# Patient Record
Sex: Male | Born: 1950
Health system: Southern US, Community
[De-identification: ages and names within clinical notes are randomized; demographics above are authoritative.]

## PROBLEM LIST (undated history)

## (undated) DIAGNOSIS — I2510 Atherosclerotic heart disease of native coronary artery without angina pectoris: Secondary | ICD-10-CM

## (undated) DIAGNOSIS — M1990 Unspecified osteoarthritis, unspecified site: Secondary | ICD-10-CM

## (undated) DIAGNOSIS — N200 Calculus of kidney: Secondary | ICD-10-CM

## (undated) DIAGNOSIS — I10 Essential (primary) hypertension: Secondary | ICD-10-CM

## (undated) DIAGNOSIS — I509 Heart failure, unspecified: Secondary | ICD-10-CM

## (undated) DIAGNOSIS — E785 Hyperlipidemia, unspecified: Secondary | ICD-10-CM

## (undated) DIAGNOSIS — K219 Gastro-esophageal reflux disease without esophagitis: Secondary | ICD-10-CM

## (undated) DIAGNOSIS — R0902 Hypoxemia: Secondary | ICD-10-CM

## (undated) HISTORY — DX: Hypoxemia: R09.02

## (undated) HISTORY — DX: Gastro-esophageal reflux disease without esophagitis: K21.9

## (undated) HISTORY — DX: Hyperlipidemia, unspecified: E78.5

## (undated) HISTORY — DX: Atherosclerotic heart disease of native coronary artery without angina pectoris: I25.10

## (undated) HISTORY — DX: Heart failure, unspecified: I50.9

## (undated) HISTORY — DX: Unspecified osteoarthritis, unspecified site: M19.90

## (undated) HISTORY — DX: Essential (primary) hypertension: I10

## (undated) HISTORY — DX: Calculus of kidney: N20.0

---
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
## 2007-06-29 DIAGNOSIS — N200 Calculus of kidney: Secondary | ICD-10-CM

## 2007-06-29 HISTORY — DX: Calculus of kidney: N20.0

## 2009-06-04 ENCOUNTER — Ambulatory Visit: Payer: Self-pay | Admitting: Family Medicine

## 2009-06-04 DIAGNOSIS — E291 Testicular hypofunction: Secondary | ICD-10-CM | POA: Insufficient documentation

## 2009-06-04 DIAGNOSIS — K219 Gastro-esophageal reflux disease without esophagitis: Secondary | ICD-10-CM | POA: Insufficient documentation

## 2009-06-04 DIAGNOSIS — N529 Male erectile dysfunction, unspecified: Secondary | ICD-10-CM | POA: Insufficient documentation

## 2009-06-04 DIAGNOSIS — I10 Essential (primary) hypertension: Secondary | ICD-10-CM | POA: Insufficient documentation

## 2009-06-06 LAB — CONVERTED CEMR LAB
BUN: 21 mg/dL (ref 6–23)
CO2: 25 meq/L (ref 19–32)
Calcium: 9.5 mg/dL (ref 8.4–10.5)
Chloride: 103 meq/L (ref 96–112)
Cholesterol: 199 mg/dL (ref 0–200)
Creatinine, Ser: 1.1 mg/dL (ref 0.40–1.50)
HDL goal, serum: 40 mg/dL
HDL: 50 mg/dL (ref >39)
Testosterone Free: 56.8 pg/mL (ref 47.0–244.0)
Total CHOL/HDL Ratio: 4

## 2009-08-05 ENCOUNTER — Ambulatory Visit: Payer: Self-pay | Admitting: Family Medicine

## 2009-08-05 DIAGNOSIS — J029 Acute pharyngitis, unspecified: Secondary | ICD-10-CM | POA: Insufficient documentation

## 2009-08-05 LAB — CONVERTED CEMR LAB: Rapid Strep: NEGATIVE

## 2009-08-22 ENCOUNTER — Telehealth: Payer: Self-pay | Admitting: Family Medicine

## 2010-06-10 ENCOUNTER — Encounter: Payer: Self-pay | Admitting: Family Medicine

## 2010-06-10 ENCOUNTER — Ambulatory Visit: Payer: Self-pay | Admitting: Family Medicine

## 2010-06-11 LAB — CONVERTED CEMR LAB
ALT: 27 units/L (ref 0–53)
AST: 23 units/L (ref 0–37)
Albumin: 4.5 g/dL (ref 3.5–5.2)
Calcium: 9.4 mg/dL (ref 8.4–10.5)
Chloride: 105 meq/L (ref 96–112)
PSA: 0.5 ng/mL (ref <=4.00)
Potassium: 4.6 meq/L (ref 3.5–5.3)
Sodium: 140 meq/L (ref 135–145)
Total CHOL/HDL Ratio: 3.3

## 2010-07-28 NOTE — Letter (Signed)
Summary: Out of Work  [facility]  [address]   [place], [place] [address]   Phone: [phone]  Fax: [phone]    August 05, 2009   Employee:  Joshua Warner    To Whom It May Concern:   For Medical reasons, please excuse the above named employee from work for the following dates:  Start:   Feb 8th - 9th  End:   Feb 10th  If you need additional information, please feel free to contact our office.         Sincerely,    Seymour Bars DO

## 2010-07-28 NOTE — Progress Notes (Signed)
Summary: ST and cough  Phone Note Call from Patient   Summary of Call: Pt called stating he still has ST and cough. Pt requests meds to be called for him. Please advise.  Initial call taken by: Payton Spark CMA,  August 22, 2009 9:26 AM  Follow-up for Phone Call        OK, will go ahead and treat with a round of antibiotics.  Call if not improved afterwards. Follow-up by: Seymour Bars DO,  August 22, 2009 9:46 AM    New/Updated Medications: AMOXICILLIN 500 MG CAPS (AMOXICILLIN) 1 capsule by mouth three times a day x 10 days Prescriptions: AMOXICILLIN 500 MG CAPS (AMOXICILLIN) 1 capsule by mouth three times a day x 10 days  #30 x 0   Entered and Authorized by:   Seymour Bars DO   Signed by:   Seymour Bars DO on 08/22/2009   Method used:   Electronically to        [facility] [phone]* (retail)       [address] [place], [place]  [address]       Ph: [phone]       Fax: [phone]   RxID:   [id number]   Appended Document: ST and cough Pt aware

## 2010-07-28 NOTE — Assessment & Plan Note (Signed)
Summary: URI   Vital Signs:  Patient profile:   60 year old male Height:      70.5 inches Weight:      215 pounds BMI:     30.52 O2 Sat:      95 % on Room air Temp:     98.2 degrees F oral Pulse rate:   80 / minute BP sitting:   145 / 92  (left arm) Cuff size:   large  Vitals Entered By: Payton Spark CMA/april (August 05, 2009 1:12 PM)  O2 Flow:  Room air CC: c/o sore throat and cough x 3 days   Primary Care Provider:  Nani Gasser, MD  CC:  c/o sore throat and cough x 3 days.  History of Present Illness: 60 yo WM presents for 3 days of cough followed by sore throat and subjective fevers.  Has fatigue.  Had a flu shot this year.  He took Nyquil last night which helped.  No sinus pressure or HA.  His cough is dry.  He is sleeping well w/ Nyquil.  No GI upset.  No rhinorrhea.      Current Medications (verified): 1)  Losartan Potassium 50 Mg Tabs (Losartan Potassium) .... Take 1 Tablet By Mouth Once A Day 2)  Omeprazole 20 Mg Cpdr (Omeprazole) .... Take One Tablet By Mouth Once A Day 3)  Multivitamins  Tabs (Multiple Vitamin) .... Take One Tablet By Mouth Once A Day 4)  Glucosamine-Chondroitin  Caps (Glucosamine-Chondroit-Vit C-Mn) .... Take Noe Tablet By Mouth Once A Day 5)  Cialis 2.5 Mg Tabs (Tadalafil) .... Take 1 Tablet By Mouth Once A Day  Allergies (verified): 1)  ! Sulfa 2)  ! Celebrex 3)  ! * Bextra  Past History:  Past Medical History: Reviewed history from 06/04/2009 and no changes required. HTN since 2007 Shatzkis ring 03/2006 stretched - colonoscopy 03/2006.  Hx of kideny stones 2009  Past Surgical History: Reviewed history from 06/04/2009 and no changes required. Tonsillectomy 1957  Social History: Reviewed history from 06/04/2009 and no changes required. 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  Married to [place] 2 adult kids.  Never Smoked Alcohol use-yes Drug use-no Regular exercise-no 2-3 caffeinated drinks per  day.   Review of Systems      See HPI  Physical Exam  General:  alert, well-developed, well-nourished, and well-hydrated.   Head:  normocephalic and atraumatic.  sinuses NTTP Eyes:  conjunctiva clear Nose:  no nasal discharge.   Mouth:  good dentition and pharynx pink and moist.  o/p injected Neck:  no masses.   Lungs:  Normal respiratory effort, chest expands symmetrically. Lungs are clear to auscultation, no crackles or wheezes. dry cough. Heart:  Normal rate and regular rhythm. S1 and S2 normal without gallop, murmur, click, rub or other extra sounds. Skin:  color normal.   Cervical Nodes:  shotty anterior cervical chain LA   Impression & Recommendations:  Problem # 1:  ACUTE PHARYNGITIS (ICD-462)  Rapid strep: Neg. Treat for viral URI/ pharyngitis with supportive care. Avoid decongestants with HTN. Call if not improving by Monday.  Orders: Rapid Strep ([id number])  Complete Medication List: 1)  Losartan Potassium 50 Mg Tabs (Losartan potassium) .... Take 1 tablet by mouth once a day 2)  Omeprazole 20 Mg Cpdr (Omeprazole) .... Take one tablet by mouth once a day 3)  Multivitamins Tabs (Multiple vitamin) .... Take one tablet by mouth once a day 4)  Glucosamine-chondroitin  Caps (Glucosamine-chondroit-vit c-mn) .... Take noe tablet by mouth once a day 5)  Cialis 2.5 Mg Tabs (Tadalafil) .... Take 1 tablet by mouth once a day  Patient Instructions: 1)  Rapid Strep : NEGATIVE. 2)  Treat viral sore throat with Advil as needed. 3)  Use Chlorasceptic spray, drink plenty of clear fluids and rest. 4)  Call if not improving by Monday.  Laboratory Results    Other Tests  Rapid Strep: negative

## 2010-07-30 NOTE — Assessment & Plan Note (Signed)
Summary: Fasting CPE   Vital Signs:  Patient profile:   60 year old male Height:      70.5 inches Weight:      205 pounds Pulse rate:   67 / minute BP sitting:   127 / 87  (right arm) Cuff size:   large  Vitals Entered By: Avon Gully CMA, Duncan Dull) (June 10, 2010 8:34 AM) CC: CPE   Primary Care Dyland Panuco:  Nani Gasser, MD  CC:  CPE.  History of Present Illness: has lost 10 lbs.  Just got new glasses a few weeks ago.  No other specific complaints. not taking the cialis as was too expensive.   Current Medications (verified): 1)  Losartan Potassium 50 Mg Tabs (Losartan Potassium) .... Take 1 Tablet By Mouth Once A Day 2)  Omeprazole 20 Mg Cpdr (Omeprazole) .... Take One Tablet By Mouth Once A Day 3)  Multivitamins  Tabs (Multiple Vitamin) .... Take One Tablet By Mouth Once A Day 4)  Glucosamine-Chondroitin  Caps (Glucosamine-Chondroit-Vit C-Mn) .... Take Noe Tablet By Mouth Once A Day 5)  Cialis 2.5 Mg Tabs (Tadalafil) .... Take 1 Tablet By Mouth Once A Day 6)  Amoxicillin 500 Mg Caps (Amoxicillin) .Marland Kitchen.. 1 Capsule By Mouth Three Times A Day X 10 Days  Allergies (verified): 1)  ! Sulfa 2)  ! Celebrex 3)  ! * Bextra  Comments:  Nurse/Medical Assistant: The patient's medications and allergies were reviewed with the patient and were updated in the Medication and Allergy Lists. Avon Gully CMA, Duncan Dull) (June 10, 2010 8:35 AM)  Past History:  Past Medical History: Last updated: 06/04/2009 HTN since 2007 Shatzkis ring 03/2006 stretched - colonoscopy 03/2006.  Hx of kideny stones 2009  Past Surgical History: Last updated: 06/04/2009 Tonsillectomy 1957  Family History: Last updated: 06/04/2009 Uncle with prostate Ca GF with MI Uncle with depression Mother with HTN father died in his 24s but not clear on cause.    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  Married to [place] 2 adult kids.    Never Smoked Alcohol use-yes Drug use-no Regular exercise-no 2-3 caffeinated drinks per day.   Review of Systems  The patient denies anorexia, fever, weight loss, weight gain, vision loss, decreased hearing, hoarseness, chest pain, syncope, dyspnea on exertion, peripheral edema, prolonged cough, headaches, hemoptysis, abdominal pain, melena, hematochezia, severe indigestion/heartburn, hematuria, incontinence, genital sores, muscle weakness, suspicious skin lesions, transient blindness, difficulty walking, depression, unusual weight change, abnormal bleeding, enlarged lymph nodes, angioedema, and testicular masses.    Physical Exam  General:  Well-developed,well-nourished,in no acute distress; alert,appropriate and cooperative throughout examination Head:  Normocephalic and atraumatic without obvious abnormalities. No apparent alopecia or balding. Eyes:  No corneal or conjunctival inflammation noted. EOMI. Perrla.  Ears:  External ear exam shows no significant lesions or deformities.  Otoscopic examination reveals clear canals, tympanic membranes are intact bilaterally without bulging, retraction, inflammation or discharge. Hearing is grossly normal bilaterally. Nose:  External nasal examination shows no deformity or inflammation. Nasal mucosa are pink and moist without lesions or exudates. Mouth:  Oral mucosa and oropharynx without lesions or exudates.  Teeth in good repair. Neck:  No deformities, masses, or tenderness noted. Chest Wall:  No deformities, masses, tenderness or gynecomastia noted. Lungs:  Normal respiratory effort, chest expands symmetrically. Lungs are clear to auscultation, no crackles or wheezes. Heart:  Normal rate and regular rhythm. S1 and S2 normal without gallop, murmur, click, rub  or other extra sounds. Abdomen:  Bowel sounds positive,abdomen soft and non-tender without masses, organomegaly or hernias noted. Rectal:  no external abnormalities, normal sphincter tone,  and no masses.   Prostate:  no nodules, no asymmetry, and 1+ enlarged.   Msk:  No deformity or scoliosis noted of thoracic or lumbar spine.   Pulses:  R and L carotid,radial,dorsalis pedis and posterior tibial pulses are full and equal bilaterally Extremities:  No clubbing, cyanosis, edema, or deformity noted with normal full range of motion of all joints.   Neurologic:  No cranial nerve deficits noted. Station and gait are normal. DTRs are symmetrical throughout. Sensory, motor and coordinative functions appear intact. Skin:  no rashes.  no rashes.   Cervical Nodes:  No lymphadenopathy noted Psych:  Cognition and judgment appear intact. Alert and cooperative with normal attention span and concentration. No apparent delusions, illusions, hallucinations   Impression & Recommendations:  Problem # 1:  HEALTH MAINTENANCE EXAM (ICD-V70.0) Exam is normal.  Due for screening labs Immunizations are uptodate.   Orders: T-Comprehensive Metabolic Panel [id number]) T-Lipid Profile [phone]) T-PSA ([id number])  Problem # 2:  HYPERTENSION, BENIGN (ICD-401.1) Looks great tody. Refill sent.  His updated medication list for this problem includes:    Losartan Potassium 50 Mg Tabs (Losartan potassium) .Marland Kitchen... Take 1 tablet by mouth once a day  Complete Medication List: 1)  Losartan Potassium 50 Mg Tabs (Losartan potassium) .... Take 1 tablet by mouth once a day 2)  Omeprazole 20 Mg Cpdr (Omeprazole) .... Take one tablet by mouth once a day 3)  Multivitamins Tabs (Multiple vitamin) .... Take one tablet by mouth once a day 4)  Glucosamine-chondroitin Caps (Glucosamine-chondroit-vit c-mn) .... Take noe tablet by mouth once a day 5)  Cialis 2.5 Mg Tabs (Tadalafil) .... Take 1 tablet by mouth once a day  Patient Instructions: 1)  Please schedule a follow-up appointment in 6 monthsfor blood pressure .  2)  We will call you with your lab results.  Prescriptions: OMEPRAZOLE 20 MG CPDR (OMEPRAZOLE)  Take one tablet by mouth once a day  #90 x 3   Entered and Authorized by:   Nani Gasser MD   Signed by:   Nani Gasser MD on 06/10/2010   Method used:   Electronically to        [facility]* (retail)             ,          Ph: [phone]       Fax: [phone]   RxID:   [id number] LOSARTAN POTASSIUM 50 MG TABS (LOSARTAN POTASSIUM) Take 1 tablet by mouth once a day  #90 x 3   Entered and Authorized by:   Nani Gasser MD   Signed by:   Nani Gasser MD on 06/10/2010   Method used:   Electronically to        [facility]* (retail)             ,          Ph: [phone]       Fax: [phone]   RxID:   [id number] GURKYHCW POTASSIUM 50 MG TABS (LOSARTAN POTASSIUM) Take 1 tablet by mouth once a day  #90 x 3   Entered and Authorized by:   Nani Gasser MD   Signed by:   Nani Gasser MD on 06/10/2010   Method used:   Electronically to        [facility] [phone]* (retail)  [address] [place], [place]  [address]       Ph: [phone]       Fax: [phone]   RxID:   [id number]    Orders Added: 1)  Est. Patient age 84-64 [22] 2)  T-Comprehensive Metabolic Panel [[id number]] 3)  T-Lipid Profile [id number] 4)  T-PSA [[id number]]   Immunization History:  Influenza Immunization History:    Influenza:  historical (03/28/2010)   Immunization History:  Influenza Immunization History:    Influenza:  Historical (03/28/2010)   Immunization History:  Influenza Immunization History:    Influenza:  historical (03/28/2010)

## 2010-11-10 ENCOUNTER — Encounter: Payer: Self-pay | Admitting: Family Medicine

## 2010-11-16 ENCOUNTER — Ambulatory Visit (INDEPENDENT_AMBULATORY_CARE_PROVIDER_SITE_OTHER): Payer: BC Managed Care – PPO | Admitting: Family Medicine

## 2010-11-16 ENCOUNTER — Ambulatory Visit
Admission: RE | Admit: 2010-11-16 | Discharge: 2010-11-16 | Disposition: A | Discharge status: Home / Self Care | Payer: BC Managed Care – PPO | Source: Ambulatory Visit | Attending: Family Medicine | Admitting: Family Medicine

## 2010-11-16 ENCOUNTER — Encounter: Payer: Self-pay | Admitting: Family Medicine

## 2010-11-16 ENCOUNTER — Telehealth: Payer: Self-pay | Admitting: Family Medicine

## 2010-11-16 VITALS — BP 132/93 | HR 89 | Ht 70.5 in | Wt 207.0 lb

## 2010-11-16 DIAGNOSIS — M25569 Pain in unspecified knee: Secondary | ICD-10-CM

## 2010-11-16 DIAGNOSIS — M25561 Pain in right knee: Secondary | ICD-10-CM

## 2010-11-16 DIAGNOSIS — I10 Essential (primary) hypertension: Secondary | ICD-10-CM

## 2010-11-16 MED ORDER — LOSARTAN POTASSIUM 50 MG PO TABS: 100.0000 mg | ORAL_TABLET | Freq: Every day | ORAL | Status: DC

## 2010-11-16 MED ORDER — LOSARTAN POTASSIUM 100 MG PO TABS: 100.0000 mg | ORAL_TABLET | Freq: Every day | ORAL | Status: DC

## 2010-11-16 NOTE — Assessment & Plan Note (Signed)
Not at goal. Inc losatartan to 100mg  and recheck in 1 month.

## 2010-11-16 NOTE — Progress Notes (Signed)
  Subjective:    Patient ID: Joshua Warner, male    DOB: 1950/12/07, 60 y.o.   MRN: [id number]  HPI Right knee pain for the last 3 months.  Injured his knee about 5-6 years ago. Was crouching down and reached for something and then felt something pop in his knee and it was very painful.  It happened again a few more times. Then last fall the right knee would get stiff eiding in the car for long periods.  Started again in March after sitting in care for long ride.  No swelling.  Has been popping a little.  No locking.  Painful in the back of the knee.  It will get better with rest for a couple of days. Taking aleve, Advil, some naproxen.   Review of Systems     Objective:   Physical Exam  Constitutional: He appears well-developed and well-nourished.  Musculoskeletal:       Right knee with NROM.  No laxity of the joint compared to the left. No swelling. Nontender along the joint lines or posterially. Neg McMurrays. Some slight discomfort with full fletion.  Hip, knee and ankle strength 5/5.           Assessment & Plan:  Right knee Pain - Xray since pain on and off for several years. Thought will likely be normal . Refer to Ortho.  Continue Alve bid prn pain.  I am most suspiciious of a cartilage tear.

## 2010-11-16 NOTE — Telephone Encounter (Signed)
Call pt: Xray is nl. We can try to have you set up with ortho or we can try to order an MRI.

## 2010-11-16 NOTE — Telephone Encounter (Signed)
Pt notified and pt would like to do the MRI first

## 2010-12-02 ENCOUNTER — Other Ambulatory Visit: Payer: Self-pay | Admitting: Family Medicine

## 2010-12-02 ENCOUNTER — Ambulatory Visit (INDEPENDENT_AMBULATORY_CARE_PROVIDER_SITE_OTHER): Payer: BC Managed Care – PPO | Admitting: Family Medicine

## 2010-12-02 VITALS — BP 115/83 | HR 68

## 2010-12-02 DIAGNOSIS — I10 Essential (primary) hypertension: Secondary | ICD-10-CM

## 2010-12-02 MED ORDER — LOSARTAN POTASSIUM 100 MG PO TABS: 100.0000 mg | ORAL_TABLET | Freq: Every day | ORAL | Status: DC

## 2010-12-02 NOTE — Progress Notes (Signed)
  Subjective:    Patient ID: Joshua Warner, male    DOB: Aug 21, 1950, 60 y.o.   MRN: [id number]  HPI  Pt denies chest pain, SOB, dizziness, or heart palpitations.  Taking meds as directed w/o problems.  Denies medication side effects.  5 min spent with pt.   Review of Systems     Objective:   Physical Exam        Assessment & Plan:  BP looks perfect on Cozaar 100 mg/ day. Continue RX sent to [facility].

## 2010-12-02 NOTE — Telephone Encounter (Signed)
Patient walk-in office today request to know if he can have a pill to calm his nerves for MRI he is having done on Saturday just to help him stay comfortable during 45 min MRI. Patient states he gets anxious and very uncomfortable during the MRI. Patient said he can come pick it up or if you can send it to [facility] pharmacy in [place]

## 2010-12-02 NOTE — Progress Notes (Signed)
  Subjective:    Patient ID: Joshua Warner, male    DOB: Nov 26, 1950, 60 y.o.   MRN: [id number]  HPI  Pt denies chest pain, SOB, dizziness, or heart palpitations.  Taking meds as directed w/o problems.  Denies medication side effects.  5 min spent with pt.     Review of Systems     Objective:   Physical Exam        Assessment & Plan:

## 2010-12-02 NOTE — Telephone Encounter (Signed)
Will fax over rx today.

## 2010-12-03 ENCOUNTER — Other Ambulatory Visit: Payer: Self-pay | Admitting: Family Medicine

## 2010-12-03 MED ORDER — ALPRAZOLAM 1 MG PO TABS: ORAL_TABLET | ORAL | Status: DC

## 2010-12-03 NOTE — Telephone Encounter (Signed)
Pt called and is requesting losartan 100mg . Plan  This med was sent to [facility] on 12-02-10.  Pt informed. Jarvis Newcomer, LPN Domingo Dimes

## 2010-12-03 NOTE — Telephone Encounter (Signed)
Pt informed that Dr. Linford Arnold sent a prescription for anxeity to help him get through the procedure on Saturday.  Told pt to pup and take 30 mins prior to MRI on Saturday.  Pt voiced understanding. Jarvis Newcomer, LPN [facility] Dimes

## 2010-12-05 ENCOUNTER — Ambulatory Visit
Admission: RE | Admit: 2010-12-05 | Discharge: 2010-12-05 | Disposition: A | Discharge status: Home / Self Care | Payer: BC Managed Care – PPO | Source: Ambulatory Visit | Attending: Family Medicine | Admitting: Family Medicine

## 2010-12-05 DIAGNOSIS — M25569 Pain in unspecified knee: Secondary | ICD-10-CM

## 2010-12-06 ENCOUNTER — Telehealth: Payer: Self-pay | Admitting: Family Medicine

## 2010-12-06 DIAGNOSIS — S83289A Other tear of lateral meniscus, current injury, unspecified knee, initial encounter: Secondary | ICD-10-CM

## 2010-12-06 NOTE — Telephone Encounter (Signed)
Call p: MRI shows tear of the lateral miniscus.  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  Also medial plica syndrome.  This means he really needs to see ortho for further evaluation.

## 2010-12-07 NOTE — Telephone Encounter (Signed)
Advised pt  Of result. Pt states he has appt w/ortho on Wed.

## 2010-12-31 HISTORY — PX: KNEE ARTHROSCOPY: SUR90

## 2011-01-15 ENCOUNTER — Ambulatory Visit: Payer: BC Managed Care – PPO | Attending: Orthopedic Surgery | Admitting: Physical Therapy

## 2011-01-15 DIAGNOSIS — M25669 Stiffness of unspecified knee, not elsewhere classified: Secondary | ICD-10-CM | POA: Insufficient documentation

## 2011-01-15 DIAGNOSIS — M6281 Muscle weakness (generalized): Secondary | ICD-10-CM | POA: Insufficient documentation

## 2011-01-15 DIAGNOSIS — R269 Unspecified abnormalities of gait and mobility: Secondary | ICD-10-CM | POA: Insufficient documentation

## 2011-01-15 DIAGNOSIS — IMO0001 Reserved for inherently not codable concepts without codable children: Secondary | ICD-10-CM | POA: Insufficient documentation

## 2011-01-15 DIAGNOSIS — M25569 Pain in unspecified knee: Secondary | ICD-10-CM | POA: Insufficient documentation

## 2011-01-18 ENCOUNTER — Other Ambulatory Visit: Payer: Self-pay | Admitting: Family Medicine

## 2011-01-18 ENCOUNTER — Telehealth: Payer: Self-pay | Admitting: Family Medicine

## 2011-01-18 ENCOUNTER — Ambulatory Visit: Payer: BC Managed Care – PPO | Admitting: Physical Therapy

## 2011-01-18 NOTE — Telephone Encounter (Signed)
Pt called and said [facility] has not sent his BP med to him and he had called in days ago to request. Plan:  Reviewed the pt chart and the RX was sent on 12-02-10.  Told the pt to call [facility] to see if they have it and if not to have them call and speak directly with the triage nurse, and I will give a verbal order. Jarvis Newcomer, LPN [facility] Dimes

## 2011-01-18 NOTE — Telephone Encounter (Signed)
Pt called and stated that [facility] did not have his refill for losartan from 12-02-10.  [facility] and gave a verbal order over the phone. Jarvis Newcomer, LPN [facility] Dimes

## 2011-01-22 ENCOUNTER — Ambulatory Visit: Payer: BC Managed Care – PPO | Admitting: Physical Therapy

## 2011-01-25 ENCOUNTER — Ambulatory Visit: Payer: BC Managed Care – PPO | Admitting: Physical Therapy

## 2011-01-27 ENCOUNTER — Ambulatory Visit: Payer: BC Managed Care – PPO | Attending: Orthopedic Surgery | Admitting: Physical Therapy

## 2011-01-27 DIAGNOSIS — M25669 Stiffness of unspecified knee, not elsewhere classified: Secondary | ICD-10-CM | POA: Insufficient documentation

## 2011-01-27 DIAGNOSIS — IMO0001 Reserved for inherently not codable concepts without codable children: Secondary | ICD-10-CM | POA: Insufficient documentation

## 2011-01-27 DIAGNOSIS — M6281 Muscle weakness (generalized): Secondary | ICD-10-CM | POA: Insufficient documentation

## 2011-01-27 DIAGNOSIS — R269 Unspecified abnormalities of gait and mobility: Secondary | ICD-10-CM | POA: Insufficient documentation

## 2011-01-27 DIAGNOSIS — M25569 Pain in unspecified knee: Secondary | ICD-10-CM | POA: Insufficient documentation

## 2011-02-01 ENCOUNTER — Ambulatory Visit: Payer: BC Managed Care – PPO | Admitting: Physical Therapy

## 2011-02-04 ENCOUNTER — Ambulatory Visit: Payer: BC Managed Care – PPO | Admitting: Physical Therapy

## 2011-02-08 ENCOUNTER — Ambulatory Visit: Payer: BC Managed Care – PPO | Admitting: Physical Therapy

## 2011-05-28 ENCOUNTER — Encounter: Payer: Self-pay | Admitting: Family Medicine

## 2011-06-03 ENCOUNTER — Ambulatory Visit (INDEPENDENT_AMBULATORY_CARE_PROVIDER_SITE_OTHER): Payer: BC Managed Care – PPO | Admitting: Family Medicine

## 2011-06-03 ENCOUNTER — Encounter: Payer: Self-pay | Admitting: Family Medicine

## 2011-06-03 VITALS — BP 132/77 | HR 74 | Ht 69.0 in | Wt 204.0 lb

## 2011-06-03 DIAGNOSIS — Z0000 Encounter for general adult medical examination without abnormal findings: Secondary | ICD-10-CM

## 2011-06-03 MED ORDER — LOSARTAN POTASSIUM 100 MG PO TABS: 100.0000 mg | ORAL_TABLET | Freq: Every day | ORAL | Status: DC

## 2011-06-03 MED ORDER — OMEPRAZOLE 20 MG PO CPDR: 20.0000 mg | DELAYED_RELEASE_CAPSULE | Freq: Every day | ORAL | Status: DC

## 2011-06-03 NOTE — Progress Notes (Signed)
  Subjective:    Patient ID: Joshua Warner, male    DOB: [birth year], 60 y.o.   MRN: [id number]  HPI  Here for CPE. No complaints.   Review of Systems Comprehensive ROS is neg  BP 132/77  Pulse 74  Ht 5\' 9"  (1.753 m)  Wt 204 lb (92.534 kg)  BMI 30.13 kg/m2    Allergies  Allergen Reactions  . Celecoxib     REACTION: rash  . Sulfonamide Derivatives     REACTION: hives    Past Medical History  Diagnosis Date  . Hypertension since 2007  . Kidney stones 2009    Past Surgical History  Procedure Date  . Tonsillectomy 1957  . Knee arthroscopy 12/31/2010    Right knee     History   Social History  . Marital Status: Married    Spouse Name: N/A    Number of Children: 2  . Years of Education: N/A   Occupational History  .  [employer]   Social History Main Topics  . Smoking status: Never Smoker   . Smokeless tobacco: Not on file  . Alcohol Use: 2.0 oz/week    4 drink(s) per week  . Drug Use: No  . 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   Other Topics Concern  . Not on file   Social History Narrative   No regular exercise. Has more physical job.     Family History  Problem Relation Age of Onset  . Hypertension Mother   . Prostate cancer Maternal Uncle     Deceased  . Depression Maternal Uncle   . Cancer Paternal Grandfather   . Cancer Paternal Grandmother   . Heart disease Father        Objective:   Physical Exam  Constitutional: He is oriented to person, place, and time. He appears well-developed and well-nourished.  HENT:  Head: Normocephalic and atraumatic.  Right Ear: External ear normal.  Left Ear: External ear normal.  Nose: Nose normal.  Mouth/Throat: Oropharynx is clear and moist.  Eyes: Conjunctivae and EOM are normal. Pupils are equal, round, and reactive to light.  Neck: Normal range of motion. Neck supple.  No thyromegaly present.  Cardiovascular: Normal rate, regular rhythm, normal heart sounds and intact distal pulses.        No carotid or abdominal bruits.   Pulmonary/Chest: Effort normal and breath sounds normal.  Abdominal: Soft. Bowel sounds are normal. He exhibits no distension and no mass. There is no tenderness. There is no rebound and no guarding.  Genitourinary: Prostate normal. Guaiac negative stool.  Musculoskeletal: Normal range of motion.  Lymphadenopathy:    He has no cervical adenopathy.  Neurological: He is alert and oriented to person, place, and time. He has normal reflexes.  Skin: Skin is warm and dry.  Psychiatric: He has a normal mood and affect. His behavior is normal. Judgment and thought content normal.          Assessment & Plan:  CPE - doing well.  Given lab slip for labs for BP.  Start a regular exercise program and make sure you are eating a healthy diet Try to eat 4 servings of dairy a day or take a calcium supplement (500mg  twice a day). Your vaccines are up to date.

## 2011-06-03 NOTE — Patient Instructions (Signed)
Start a regular exercise program and make sure you are eating a healthy diet Try to eat 4 servings of dairy a day or take a calcium supplement (500mg twice a day). Your vaccines are up to date.   

## 2011-06-04 LAB — LIPID PANEL
Cholesterol: 173 mg/dL (ref 0–200)
HDL: 44 mg/dL (ref >39)
LDL Cholesterol: 103 mg/dL — ABNORMAL HIGH (ref 0–99)
Triglycerides: 128 mg/dL (ref <150)
VLDL: 26 mg/dL (ref 0–40)

## 2011-06-04 LAB — COMPLETE METABOLIC PANEL WITH GFR
ALT: 22 U/L (ref 0–53)
AST: 17 U/L (ref 0–37)
BUN: 19 mg/dL (ref 6–23)
Calcium: 9.6 mg/dL (ref 8.4–10.5)
Creat: 1.07 mg/dL (ref 0.50–1.35)
Total Bilirubin: 0.6 mg/dL (ref 0.3–1.2)

## 2011-07-20 ENCOUNTER — Other Ambulatory Visit: Payer: Self-pay | Admitting: *Deleted

## 2011-07-20 MED ORDER — OMEPRAZOLE 20 MG PO CPDR: 20.0000 mg | DELAYED_RELEASE_CAPSULE | Freq: Every day | ORAL | Status: DC

## 2011-07-20 MED ORDER — LOSARTAN POTASSIUM 100 MG PO TABS: 100.0000 mg | ORAL_TABLET | Freq: Every day | ORAL | Status: DC

## 2011-07-21 ENCOUNTER — Other Ambulatory Visit: Payer: Self-pay | Admitting: Family Medicine

## 2011-07-22 ENCOUNTER — Other Ambulatory Visit: Payer: Self-pay | Admitting: Family Medicine

## 2011-10-13 ENCOUNTER — Ambulatory Visit (INDEPENDENT_AMBULATORY_CARE_PROVIDER_SITE_OTHER): Payer: BC Managed Care – PPO | Admitting: Family Medicine

## 2011-10-13 ENCOUNTER — Encounter: Payer: Self-pay | Admitting: Family Medicine

## 2011-10-13 VITALS — BP 127/88 | HR 80 | Ht 70.5 in | Wt 203.0 lb

## 2011-10-13 DIAGNOSIS — I10 Essential (primary) hypertension: Secondary | ICD-10-CM

## 2011-10-13 MED ORDER — LOSARTAN POTASSIUM 100 MG PO TABS: 100.0000 mg | ORAL_TABLET | Freq: Every day | ORAL | Status: DC

## 2011-10-13 NOTE — Progress Notes (Signed)
  Subjective:    Patient ID: Joshua Warner, male    DOB: 18-Dec-1950, 61 y.o.   MRN: [id number]  HPI HTN- Taking his meds regularly.  No CP.  Occ SOB with activity but says he is deconditioned. He walks a lot at work. Working on low salt diet.  He admits he out of shape.  Never a smoker.  No orthopnea.  No palps.  Started coughing last night.  Has had bronchitis. IBetter than he was. Still some mild congestion.  No fever.    Review of Systems     Objective:   Physical Exam  Constitutional: He is oriented to person, place, and time. He appears well-developed and well-nourished.  HENT:  Head: Normocephalic and atraumatic.  Right Ear: External ear normal.  Left Ear: External ear normal.  Nose: Nose normal.  Mouth/Throat: Oropharynx is clear and moist.       TMs and canals are clear.   Eyes: Conjunctivae and EOM are normal. Pupils are equal, round, and reactive to light.  Neck: Neck supple. No thyromegaly present.  Cardiovascular: Normal rate and normal heart sounds.   Pulmonary/Chest: Effort normal and breath sounds normal.  Lymphadenopathy:    He has no cervical adenopathy.  Neurological: He is alert and oriented to person, place, and time.  Skin: Skin is warm and dry.  Psychiatric: He has a normal mood and affect.          Assessment & Plan:  HTN - WEll controlled. AT goal. Reminded him ot continue to work on diet an dexercise. F/U in 6 month. RF send to [facility].

## 2012-06-12 ENCOUNTER — Encounter: Payer: BC Managed Care – PPO | Admitting: Family Medicine

## 2012-07-10 ENCOUNTER — Ambulatory Visit (INDEPENDENT_AMBULATORY_CARE_PROVIDER_SITE_OTHER): Payer: BC Managed Care – PPO | Admitting: Family Medicine

## 2012-07-10 ENCOUNTER — Encounter: Payer: Self-pay | Admitting: Family Medicine

## 2012-07-10 VITALS — BP 119/81 | HR 76 | Resp 18 | Wt 207.0 lb

## 2012-07-10 DIAGNOSIS — L820 Inflamed seborrheic keratosis: Secondary | ICD-10-CM

## 2012-07-10 DIAGNOSIS — R5383 Other fatigue: Secondary | ICD-10-CM

## 2012-07-10 DIAGNOSIS — Z0000 Encounter for general adult medical examination without abnormal findings: Secondary | ICD-10-CM

## 2012-07-10 DIAGNOSIS — E291 Testicular hypofunction: Secondary | ICD-10-CM

## 2012-07-10 DIAGNOSIS — H16139 Photokeratitis, unspecified eye: Secondary | ICD-10-CM

## 2012-07-10 LAB — CBC
HCT: 50.1 % (ref 39.0–52.0)
Hemoglobin: 17.8 g/dL — ABNORMAL HIGH (ref 13.0–17.0)
MCV: 88 fL (ref 78.0–100.0)
RBC: 5.69 MIL/uL (ref 4.22–5.81)
WBC: 8.1 10*3/uL (ref 4.0–10.5)

## 2012-07-10 LAB — PSA, TOTAL AND FREE
PSA, Free Pct: 45 % (ref >25)
PSA, Free: 0.29 ng/mL
PSA: 0.65 ng/mL (ref <=4.00)

## 2012-07-10 LAB — LIPID PANEL: LDL Cholesterol: 109 mg/dL — ABNORMAL HIGH (ref 0–99)

## 2012-07-10 MED ORDER — OMEPRAZOLE 20 MG PO CPDR: 20.0000 mg | DELAYED_RELEASE_CAPSULE | Freq: Every day | ORAL | Status: DC

## 2012-07-10 MED ORDER — LOSARTAN POTASSIUM 100 MG PO TABS: 100.0000 mg | ORAL_TABLET | Freq: Every day | ORAL | Status: DC

## 2012-07-10 NOTE — Progress Notes (Signed)
Subjective:    Patient ID: Joshua Warner, male    DOB: September 10, 1950, 62 y.o.   MRN: [id number]  HPI Here for CPE. No complaints. Vaccines are up to date. Colonoscopy and tetanus shot are up-to-date. He did have a flu shot at work this year. He is interested in additional vaccine but wants to check with his insurance first. Joshua Warner give him a handout. He would also like to have his testosterone recheck. He was on AndroGel for 5 years ago and is interested in having his levels rechecked. He's been feeling more fatigued lately at work and still has about 5 more years ago before he retires. He also has 2 lesions on his left forehead that he would like me to look at today. They've been there for quite some time and feels like one of them has been getting a little bit larger. He has had a history of significant sun exposure. He has not tried any treatments on them.    Review of Systems Comprehensive ROS is neg.    BP 119/81  Pulse 76  Resp 18  Wt 207 lb (93.895 kg)  SpO2 97%    Allergies  Allergen Reactions  . Celecoxib     REACTION: rash  . Sulfonamide Derivatives     REACTION: hives    Past Medical History  Diagnosis Date  . Hypertension since 2007  . Kidney stones 2009    Past Surgical History  Procedure Date  . Tonsillectomy 1957  . Knee arthroscopy 12/31/2010    Right knee     History   Social History  . Marital Status: Married    Spouse Name: N/A    Number of Children: 2  . Years of Education: N/A   Occupational History  .  Joshua Warner [employer]   Social History Main Topics  . Smoking status: Never Smoker   . Smokeless tobacco: Never Used  . Alcohol Use: 2.0 oz/week    4 drink(s) per week  . Drug Use: No  . Sexually Active: Not on file     Comment: 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   Other Topics Concern  . Not on file   Social History Narrative   No regular exercise.  Has more physical job.     Family History  Problem Relation Age of Onset  . Hypertension Mother   . Prostate cancer Maternal Uncle     Deceased  . Depression Maternal Uncle   . Cancer Paternal Grandfather   . Cancer Paternal Grandmother   . Heart disease Father     Outpatient Encounter Prescriptions as of 07/10/2012  Medication Sig Dispense Refill  . Glucosamine-Chondroit-Vit C-Mn (GLUCOSAMINE CHONDR 1500 COMPLX) CAPS Take by mouth daily.        Marland Kitchen losartan (COZAAR) 100 MG tablet Take 1 tablet (100 mg total) by mouth daily.  90 tablet  2  . Multiple Vitamin (MULTIVITAMIN) tablet Take 1 tablet by mouth daily.        Marland Kitchen omeprazole (PRILOSEC) 20 MG capsule TAKE 1 CAPSULE ONCE DAILY  90 capsule  2           Objective:   Physical Exam  Constitutional: He is oriented to person, place, and time. He appears well-developed and well-nourished.  HENT:  Head: Normocephalic and atraumatic.  Right Ear: External ear normal.  Left Ear: External ear normal.  Nose: Nose normal.  Mouth/Throat: Oropharynx is  clear and moist.  Eyes: Conjunctivae normal and EOM are normal. Pupils are equal, round, and reactive to light.  Neck: Normal range of motion. Neck supple. No thyromegaly present.  Cardiovascular: Normal rate, regular rhythm, normal heart sounds and intact distal pulses.        No carotid bruits. DP pulses 2+ bilatearlly  Pulmonary/Chest: Effort normal and breath sounds normal.  Abdominal: Soft. Bowel sounds are normal. He exhibits no distension and no mass. There is no tenderness. There is no rebound and no guarding.  Musculoskeletal: Normal range of motion.       + deformity of the toes.   Lymphadenopathy:    He has no cervical adenopathy.  Neurological: He is alert and oriented to person, place, and time. He has normal reflexes.  Skin: Skin is warm and dry.       Approximately 0.5 cm temperature ptosis on the left forehead that appears to be irritated. Just above that about and she  has a small actinic keratosis that is crusting.  Psychiatric: He has a normal mood and affect. His behavior is normal. Judgment and thought content normal.          Assessment & Plan:  CPE- Keep up a regular exercise program and make sure you are eating a healthy diet Try to eat 4 servings of dairy a day, or if you are lactose intolerant take a calcium with vitamin D daily.  Your vaccines are up to date.  Discussed shingles vaccine. He will check with his insrance to see if covered.    HTN -  Pt denies chest pain, SOB, dizziness, or heart palpitations.  Taking meds as directed w/o problems.  Denies medication side effects.  Well-controlled today.  Hypogonadism-recheck testosterone levels today as well as PSA. Certainly if his levels are low again we can discuss testosterone replacement and a new options are on the market currently.  Cryotherapy Procedure Note  Pre-operative Diagnosis: Actinic keratosis, seborrheic keratosis  Post-operative Diagnosis: same  Locations: left forehead  Indications: irritated seb k, and AK  Anesthesia: not required   Procedure Details  Patient informed of risks (permanent scarring, infection, light or dark discoloration, bleeding, infection, weakness, numbness and recurrence of the lesion) and benefits of the procedure and verbal informed consent obtained.  The areas are treated with liquid nitrogen therapy, frozen until ice ball extended 2 mm beyond lesion, allowed to thaw, and treated again. The patient tolerated procedure well.  The patient was instructed on post-op care, warned that there may be blister formation, redness and pain. Recommend OTC analgesia as needed for pain.  Condition: Stable  Complications: none.  Plan: 1. Instructed to keep the area dry and covered for 24-48h and clean thereafter. 2. Warning signs of infection were reviewed.   3. Recommended that the patient use OTC acetaminophen as needed for pain.  4. Return in 6  month.

## 2012-07-10 NOTE — Patient Instructions (Addendum)
Keep up a regular exercise program and make sure you are eating a healthy diet Try to eat 4 servings of dairy a day, or if you are lactose intolerant take a calcium with vitamin D daily.  Check with your insurance to see if they will cover the shingles vaccine.

## 2012-07-11 LAB — COMPLETE METABOLIC PANEL WITH GFR
ALT: 29 U/L (ref 0–53)
BUN: 21 mg/dL (ref 6–23)
CO2: 20 mEq/L (ref 19–32)
Calcium: 9.4 mg/dL (ref 8.4–10.5)
Chloride: 105 mEq/L (ref 96–112)
Creat: 0.94 mg/dL (ref 0.50–1.35)
GFR, Est African American: >89 mL/min
Total Bilirubin: 0.7 mg/dL (ref 0.3–1.2)

## 2012-07-11 LAB — TESTOSTERONE, FREE, TOTAL, SHBG: Testosterone: 473.19 ng/dL (ref 300–890)

## 2012-07-20 ENCOUNTER — Other Ambulatory Visit: Payer: Self-pay | Admitting: *Deleted

## 2012-07-20 MED ORDER — LOSARTAN POTASSIUM 100 MG PO TABS: 100.0000 mg | ORAL_TABLET | Freq: Every day | ORAL | Status: DC

## 2012-08-28 ENCOUNTER — Other Ambulatory Visit: Payer: Self-pay | Admitting: Family Medicine

## 2012-08-28 DIAGNOSIS — D582 Other hemoglobinopathies: Secondary | ICD-10-CM

## 2012-08-29 LAB — CBC WITH DIFFERENTIAL/PLATELET
Eosinophils Relative: 4 % (ref 0–5)
HCT: 48.8 % (ref 39.0–52.0)
Lymphocytes Relative: 39 % (ref 12–46)
Lymphs Abs: 3.1 10*3/uL (ref 0.7–4.0)
MCV: 89.1 fL (ref 78.0–100.0)
Monocytes Absolute: 0.7 10*3/uL (ref 0.1–1.0)
RBC: 5.48 MIL/uL (ref 4.22–5.81)
WBC: 8.1 10*3/uL (ref 4.0–10.5)

## 2012-10-17 ENCOUNTER — Ambulatory Visit (INDEPENDENT_AMBULATORY_CARE_PROVIDER_SITE_OTHER): Payer: BC Managed Care – PPO | Admitting: Sports Medicine

## 2012-10-17 ENCOUNTER — Ambulatory Visit (INDEPENDENT_AMBULATORY_CARE_PROVIDER_SITE_OTHER): Payer: BC Managed Care – PPO

## 2012-10-17 ENCOUNTER — Encounter: Payer: Self-pay | Admitting: Sports Medicine

## 2012-10-17 VITALS — BP 131/86 | HR 62 | Wt 209.0 lb

## 2012-10-17 DIAGNOSIS — M25549 Pain in joints of unspecified hand: Secondary | ICD-10-CM

## 2012-10-17 DIAGNOSIS — M19049 Primary osteoarthritis, unspecified hand: Secondary | ICD-10-CM

## 2012-10-17 NOTE — Progress Notes (Signed)
   Subjective:    CC: Hand pain  HPI: This is a very pleasant 61 year old male who comes in with a decade long history of pain he localizes in the right thumb basal joint. Pain is localized, sharp, moderate to severe. He received an injection into the joint approximately 2 years ago and this has lasted until now. Pain is stable.  Past medical history, Surgical history, Family history not pertinant except as noted below, Social history, Allergies, and medications have been entered into the medical record, reviewed, and no changes needed.   Review of Systems: No headache, visual changes, nausea, vomiting, diarrhea, constipation, dizziness, abdominal pain, skin rash, fevers, chills, night sweats, weight loss, swollen lymph nodes, body aches, joint swelling, muscle aches, chest pain, shortness of breath, mood changes, visual or auditory hallucinations.   Objective:   General: Well Developed, well nourished, and in no acute distress.  Neuro/Psych: Alert and oriented x3, extra-ocular muscles intact, able to move all 4 extremities, sensation grossly intact. Skin: Warm and dry, no rashes noted.  Respiratory: Not using accessory muscles, speaking in full sentences, trachea midline.  Cardiovascular: Pulses palpable, no extremity edema. Abdomen: Does not appear distended. Right Wrist: Fullness noted over the carpometacarpal joint. Tenderness to palpation at the first carpometacarpal joint. ROM smooth and normal with good flexion and extension and ulnar/radial deviation that is symmetrical with opposite wrist. Palpation is normal over metacarpals, navicular, lunate, and TFCC; tendons without tenderness/ swelling No snuffbox tenderness. No tenderness over Canal of Guyon. Strength 5/5 in all directions without pain. Negative Finkelstein, tinel's and phalens. Negative Watson's test.  Procedure: Real-time Ultrasound Guided Injection of right first carpometacarpal joint Device: GE Logiq E  Ultrasound  guided injection is preferred based studies that show increased duration, increased effect, greater accuracy, decreased procedural pain, increased response rate, and decreased cost with ultrasound guided versus blind injection.  Verbal informed consent obtained.  Time-out conducted.  Noted no overlying erythema, induration, or other signs of local infection.  Skin prepped in a sterile fashion.  Local anesthesia: Topical Ethyl chloride.  With sterile technique and under real time ultrasound guidance:  25-gauge needle advanced under real time ultrasound guidance into the thumb basal joint, 1/2 cc Kenalog 40, 1 cc lidocaine injected easily. Completed without difficulty  Pain immediately resolved suggesting accurate placement of the medication.  Advised to call if fevers/chills, erythema, induration, drainage, or persistent bleeding.  Images permanently stored and available for review in the ultrasound unit.  Impression: Technically successful ultrasound guided injection.  Impression and Recommendations:   This case required medical decision making of moderate complexity.

## 2012-10-17 NOTE — Assessment & Plan Note (Signed)
Last injection lasted 2 years. Repeat injection of the right first carpometacarpal joint. X-rays. Return in a month.

## 2012-11-07 ENCOUNTER — Encounter: Payer: Self-pay | Admitting: Sports Medicine

## 2012-11-07 ENCOUNTER — Ambulatory Visit (INDEPENDENT_AMBULATORY_CARE_PROVIDER_SITE_OTHER): Payer: BC Managed Care – PPO

## 2012-11-07 ENCOUNTER — Ambulatory Visit (INDEPENDENT_AMBULATORY_CARE_PROVIDER_SITE_OTHER): Payer: BC Managed Care – PPO | Admitting: Sports Medicine

## 2012-11-07 VITALS — BP 128/88 | HR 79 | Wt 206.0 lb

## 2012-11-07 DIAGNOSIS — M19049 Primary osteoarthritis, unspecified hand: Secondary | ICD-10-CM

## 2012-11-07 DIAGNOSIS — R05 Cough: Secondary | ICD-10-CM

## 2012-11-07 DIAGNOSIS — R0989 Other specified symptoms and signs involving the circulatory and respiratory systems: Secondary | ICD-10-CM

## 2012-11-07 DIAGNOSIS — J209 Acute bronchitis, unspecified: Secondary | ICD-10-CM

## 2012-11-07 DIAGNOSIS — R059 Cough, unspecified: Secondary | ICD-10-CM

## 2012-11-07 MED ORDER — AZITHROMYCIN 250 MG PO TABS: ORAL_TABLET | ORAL | Status: DC

## 2012-11-07 MED ORDER — BENZONATATE 200 MG PO CAPS: 200.0000 mg | ORAL_CAPSULE | Freq: Three times a day (TID) | ORAL | Status: DC | PRN

## 2012-11-07 NOTE — Progress Notes (Signed)
  Subjective:    CC: Followup  HPI: I last saw Quinterious approximately one month ago and injected his right first carpometacarpal joint. He returns today completely pain-free.  Cough: In this family is sick, he has a cough and wheeze, that is productive, no shortness of breath, no chest pain, no constitutional symptoms. Symptoms are moderate, worsening. He has no history of asthma and is a nonsmoker.  Past medical history, Surgical history, Family history not pertinant except as noted below, Social history, Allergies, and medications have been entered into the medical record, reviewed, and no changes needed.   Review of Systems: No fevers, chills, night sweats, weight loss, chest pain, or shortness of breath.   Objective:    General: Well Developed, well nourished, and in no acute distress.  Neuro: Alert and oriented x3, extra-ocular muscles intact, sensation grossly intact.  HEENT: Normocephalic, atraumatic, pupils equal round reactive to light, neck supple, no masses, no lymphadenopathy, thyroid nonpalpable.  Skin: Warm and dry, no rashes. Cardiac: Regular rate and rhythm, no murmurs rubs or gallops, no lower extremity edema.  Respiratory: Coarse sounds in the right middle lobe. Not using accessory muscles, speaking in full sentences. Impression and Recommendations:

## 2012-11-07 NOTE — Assessment & Plan Note (Signed)
Pain is 100% resolved after intra-articular injection a month ago. Return as needed.

## 2012-11-07 NOTE — Assessment & Plan Note (Signed)
Azithromycin, Tessalon Perles, chest x-rays he did have some crackles. Continue loratadine.

## 2012-11-08 ENCOUNTER — Telehealth: Payer: Self-pay | Admitting: *Deleted

## 2012-11-08 NOTE — Telephone Encounter (Signed)
Prior auth obtained from [employer] ([facility]) online for CT Chest w/ contrast. Auth # [id number]. Linda notified with Imaging.

## 2012-11-14 ENCOUNTER — Ambulatory Visit (INDEPENDENT_AMBULATORY_CARE_PROVIDER_SITE_OTHER): Payer: BC Managed Care – PPO

## 2012-11-14 DIAGNOSIS — R911 Solitary pulmonary nodule: Secondary | ICD-10-CM

## 2012-11-14 DIAGNOSIS — J209 Acute bronchitis, unspecified: Secondary | ICD-10-CM

## 2012-11-14 MED ORDER — IOHEXOL 300 MG/ML  SOLN
80.0000 mL | Freq: Once | INTRAMUSCULAR | Status: AC | PRN
  Administered 2012-11-14: 80 mL via INTRAVENOUS

## 2012-11-14 NOTE — Assessment & Plan Note (Signed)
Needs repeat CT scan in one year, May of 2015.

## 2013-01-11 ENCOUNTER — Encounter: Payer: Self-pay | Admitting: Sports Medicine

## 2013-01-11 ENCOUNTER — Ambulatory Visit (INDEPENDENT_AMBULATORY_CARE_PROVIDER_SITE_OTHER): Payer: BC Managed Care – PPO | Admitting: Sports Medicine

## 2013-01-11 VITALS — BP 126/83 | HR 67 | Wt 210.0 lb

## 2013-01-11 DIAGNOSIS — M7120 Synovial cyst of popliteal space [Baker], unspecified knee: Secondary | ICD-10-CM | POA: Insufficient documentation

## 2013-01-11 DIAGNOSIS — M7122 Synovial cyst of popliteal space [Baker], left knee: Secondary | ICD-10-CM

## 2013-01-11 NOTE — Assessment & Plan Note (Signed)
Aspiration and injection as above. Return in one month. 

## 2013-01-11 NOTE — Progress Notes (Signed)
  Subjective:    CC: Knee pain  HPI: This is a very pleasant 62 year old male who I saw some time ago with carpometacarpal DJD. This was injected and he returns today with complete relief.  Left knee pain: Has noted a swelling, mildly tender behind his knee on the medial aspect. Symptoms are localized, don't radiate, moderate. He does have gelling in the mornings and when sitting for long periods of time, denies mechanical symptoms.  Past medical history, Surgical history, Family history not pertinant except as noted below, Social history, Allergies, and medications have been entered into the medical record, reviewed, and no changes needed.   Review of Systems: No fevers, chills, night sweats, weight loss, chest pain, or shortness of breath.   Objective:    General: Well Developed, well nourished, and in no acute distress.  Neuro: Alert and oriented x3, extra-ocular muscles intact, sensation grossly intact.  HEENT: Normocephalic, atraumatic, pupils equal round reactive to light, neck supple, no masses, no lymphadenopathy, thyroid nonpalpable.  Skin: Warm and dry, no rashes. Cardiac: Regular rate and rhythm, no murmurs rubs or gallops, no lower extremity edema.  Respiratory: Clear to auscultation bilaterally. Not using accessory muscles, speaking in full sentences. Left Knee: Normal to inspection with no erythema or effusion or obvious bony abnormalities. Palpation normal with no warmth, joint line tenderness, patellar tenderness, or condyle tenderness. ROM full in flexion and extension and lower leg rotation. Ligaments with solid consistent endpoints including ACL, PCL, LCL, MCL. Negative Mcmurray's, Apley's, and Thessalonian tests. Non painful patellar compression. Patellar glide without crepitus. Patellar and quadriceps tendons unremarkable. Hamstring and quadriceps strength is normal.  There is a palpable mass on the posterior medial aspect of the knee between the medial head of the  gastrocnemius and the semimembranosus.  Procedure: Limited ultrasound of left knee Device: GE logiq E. Findings: There is a large, 7 cm hypoechoic structure between the crux of the medial head of the gastrocnemius and the semimembranosus. Impression: Baker's cyst. Images permanently stored in the unit and are available for review.  Procedure: Real-time Ultrasound Guided aspiration/injection of Baker's cyst behind the left knee. Device: GE Logiq E  Verbal informed consent obtained.  Time-out conducted.  Noted no overlying erythema, induration, or other signs of local infection.  Skin prepped in a sterile fashion.  Local anesthesia: Topical Ethyl chloride.  With sterile technique and under real time ultrasound guidance:  22-gauge needle advanced into cyst, approximately 20 cc of highly viscous fluid aspirated with great difficulty, syringe was switched in 2 cc Kenalog 40, 4 cc lidocaine injected easily into the cyst. Completed without difficulty  Pain immediately resolved suggesting accurate placement of the medication.  Advised to call if fevers/chills, erythema, induration, drainage, or persistent bleeding.  Images permanently stored and available for review in the ultrasound unit.  Impression: Technically successful ultrasound guided injection.  Impression and Recommendations:

## 2013-02-08 ENCOUNTER — Encounter: Payer: Self-pay | Admitting: Sports Medicine

## 2013-02-08 ENCOUNTER — Ambulatory Visit (INDEPENDENT_AMBULATORY_CARE_PROVIDER_SITE_OTHER): Payer: BC Managed Care – PPO | Admitting: Sports Medicine

## 2013-02-08 VITALS — BP 130/82 | HR 73 | Wt 207.0 lb

## 2013-02-08 DIAGNOSIS — M19049 Primary osteoarthritis, unspecified hand: Secondary | ICD-10-CM

## 2013-02-08 DIAGNOSIS — T7840XA Allergy, unspecified, initial encounter: Secondary | ICD-10-CM

## 2013-02-08 DIAGNOSIS — M7122 Synovial cyst of popliteal space [Baker], left knee: Secondary | ICD-10-CM

## 2013-02-08 NOTE — Assessment & Plan Note (Signed)
Continues to be completely resolved one month after aspiration and injection.

## 2013-02-08 NOTE — Assessment & Plan Note (Signed)
He continues to be resolved after injection. Has some symptoms on the left, but not bad enough for injection, we'll use over-the-counter analgesics for this.

## 2013-02-08 NOTE — Progress Notes (Signed)
  Subjective:    CC: Followup  HPI: Baker cyst, left knee: Completely resolved after aspiration and injection one month ago.  Bilateral carpometacarpal arthritis: Right-sided chest pain resolved after injection, left side still has a little the pain, has not been injected, but is controlled with oral NSAIDs.  Arm rash: Developed after taking an over-the-counter Lipoflavinoid, no respiratory symptoms, and no mucosal symptoms. Is using over the counter hydrocortisone cream which is effective.  Past medical history, Surgical history, Family history not pertinant except as noted below, Social history, Allergies, and medications have been entered into the medical record, reviewed, and no changes needed.   Review of Systems: No fevers, chills, night sweats, weight loss, chest pain, or shortness of breath.   Objective:    General: Well Developed, well nourished, and in no acute distress.  Neuro: Alert and oriented x3, extra-ocular muscles intact, sensation grossly intact.  HEENT: Normocephalic, atraumatic, pupils equal round reactive to light, neck supple, no masses, no lymphadenopathy, thyroid nonpalpable.  Skin: Warm and dry, maculopapular rash present on the volar aspect of both forearms. Cardiac: Regular rate and rhythm, no murmurs rubs or gallops, no lower extremity edema.  Respiratory: Clear to auscultation bilaterally. Not using accessory muscles, speaking in full sentences. Left Knee: Normal to inspection with no erythema or effusion or obvious bony abnormalities. Palpation normal with no warmth, joint line tenderness, patellar tenderness, or condyle tenderness. ROM full in flexion and extension and lower leg rotation. Ligaments with solid consistent endpoints including ACL, PCL, LCL, MCL. Negative Mcmurray's, Apley's, and Thessalonian tests. Non painful patellar compression. Patellar glide without crepitus. Patellar and quadriceps tendons unremarkable. Hamstring and quadriceps  strength is normal.  Impression and Recommendations:

## 2013-02-08 NOTE — Assessment & Plan Note (Signed)
Mild rash on arms after using an over-the-counter Lipoflavinoids. Will use over-the-counter hydrocortisone cream.

## 2013-03-07 ENCOUNTER — Encounter: Payer: BC Managed Care – PPO | Admitting: Sports Medicine

## 2013-04-06 ENCOUNTER — Ambulatory Visit (INDEPENDENT_AMBULATORY_CARE_PROVIDER_SITE_OTHER): Payer: BC Managed Care – PPO | Admitting: Sports Medicine

## 2013-04-06 ENCOUNTER — Encounter: Payer: Self-pay | Admitting: Sports Medicine

## 2013-04-06 VITALS — BP 131/86 | HR 64 | Wt 209.0 lb

## 2013-04-06 DIAGNOSIS — M79609 Pain in unspecified limb: Secondary | ICD-10-CM

## 2013-04-06 DIAGNOSIS — M79673 Pain in unspecified foot: Secondary | ICD-10-CM | POA: Insufficient documentation

## 2013-04-06 DIAGNOSIS — M19049 Primary osteoarthritis, unspecified hand: Secondary | ICD-10-CM

## 2013-04-06 NOTE — Assessment & Plan Note (Signed)
Custom orthotics as above. 

## 2013-04-06 NOTE — Assessment & Plan Note (Addendum)
Last injection was 6 months ago. Injected both CMC joints as above.

## 2013-04-06 NOTE — Progress Notes (Signed)
    Patient was fitted for a : standard, cushioned, semi-rigid orthotic. The orthotic was heated and afterward the patient stood on the orthotic blank positioned on the orthotic stand. The patient was positioned in subtalar neutral position and 10 degrees of ankle dorsiflexion in a weight bearing stance. After completion of molding, a stable base was applied to the orthotic blank. The blank was ground to a stable position for weight bearing. Size: 10 Base: Blue EVA Additional Posting and Padding: None The patient ambulated these, and they were very comfortable.  I spent 40 minutes with this patient, greater than 50% was face-to-face time counseling regarding the below diagnosis.  Procedure: Real-time Ultrasound Guided Injection of right carpal metacarpal joint Device: GE Logiq E  Verbal informed consent obtained.  Time-out conducted.  Noted no overlying erythema, induration, or other signs of local infection.  Skin prepped in a sterile fashion.  Local anesthesia: Topical Ethyl chloride.  With sterile technique and under real time ultrasound guidance:  0.5 cc Kenalog 40, 0.5 cc lidocaine injected easily Completed without difficulty  Pain immediately resolved suggesting accurate placement of the medication.  Advised to call if fevers/chills, erythema, induration, drainage, or persistent bleeding.  Images permanently stored and available for review in the ultrasound unit.  Impression: Technically successful ultrasound guided injection.  Procedure: Real-time Ultrasound Guided Injection of left carpal metacarpal joint Device: GE Logiq E  Verbal informed consent obtained.  Time-out conducted.  Noted no overlying erythema, induration, or other signs of local infection.  Skin prepped in a sterile fashion.  Local anesthesia: Topical Ethyl chloride.  With sterile technique and under real time ultrasound guidance:  0.5 cc Kenalog 40, 0.5 cc lidocaine injected easily into the joint. Completed  without difficulty  Pain immediately resolved suggesting accurate placement of the medication.  Advised to call if fevers/chills, erythema, induration, drainage, or persistent bleeding.  Images permanently stored and available for review in the ultrasound unit.  Impression: Technically successful ultrasound guided injection.

## 2013-07-02 ENCOUNTER — Ambulatory Visit (INDEPENDENT_AMBULATORY_CARE_PROVIDER_SITE_OTHER): Payer: BC Managed Care – PPO | Admitting: Family Medicine

## 2013-07-02 ENCOUNTER — Encounter: Payer: Self-pay | Admitting: Family Medicine

## 2013-07-02 VITALS — BP 138/81 | HR 72 | Temp 98.0°F | Ht 71.0 in | Wt 215.0 lb

## 2013-07-02 DIAGNOSIS — Z0000 Encounter for general adult medical examination without abnormal findings: Secondary | ICD-10-CM

## 2013-07-02 DIAGNOSIS — I10 Essential (primary) hypertension: Secondary | ICD-10-CM

## 2013-07-02 DIAGNOSIS — K219 Gastro-esophageal reflux disease without esophagitis: Secondary | ICD-10-CM

## 2013-07-02 MED ORDER — OMEPRAZOLE 20 MG PO CPDR: 20.0000 mg | DELAYED_RELEASE_CAPSULE | Freq: Every day | ORAL | Status: DC

## 2013-07-02 MED ORDER — LOSARTAN POTASSIUM 100 MG PO TABS: 100.0000 mg | ORAL_TABLET | Freq: Every day | ORAL | Status: DC

## 2013-07-02 NOTE — Progress Notes (Signed)
   Subjective:    Patient ID: Joshua Warner, male    DOB: 03-27-1951, 63 y.o.   MRN: [id number]  HPI Hypertension- Pt denies chest pain, SOB, dizziness, or heart palpitations.  Taking meds as directed w/o problems.  Denies medication side effects.  Joshua Warner feels Joshua Warner is deconditioned. Planning on retiring in a year.    GERD - well controlled on prilosec.  Says rarely has sxs.  occ has problems with his shatkzi's ring.      Review of Systems     Objective:   Physical Exam  Constitutional: Joshua Warner is oriented to person, place, and time. Joshua Warner appears well-developed and well-nourished.  HENT:  Head: Normocephalic and atraumatic.  Cardiovascular: Normal rate, regular rhythm and normal heart sounds.   Pulmonary/Chest: Effort normal and breath sounds normal.  Neurological: Joshua Warner is alert and oriented to person, place, and time.  Skin: Skin is warm and dry.  Psychiatric: Joshua Warner has a normal mood and affect. His behavior is normal.          Assessment & Plan:  Hypertension- Pt denies chest pain, SOB, dizziness, or heart palpitations.  Taking meds as directed w/o problems.  Denies medication side effects.    GERD - well controlled. Did discuss risk of long term effects of PPI with fracture.  RF sent.

## 2013-07-23 ENCOUNTER — Encounter: Payer: Self-pay | Admitting: Sports Medicine

## 2013-07-23 ENCOUNTER — Ambulatory Visit (INDEPENDENT_AMBULATORY_CARE_PROVIDER_SITE_OTHER): Payer: BC Managed Care – PPO | Admitting: Sports Medicine

## 2013-07-23 VITALS — BP 141/89 | HR 75 | Ht 71.0 in | Wt 216.0 lb

## 2013-07-23 DIAGNOSIS — M19049 Primary osteoarthritis, unspecified hand: Secondary | ICD-10-CM

## 2013-07-23 NOTE — Assessment & Plan Note (Signed)
Bilateral trapezial metacarpal joint injection. Last injection was approximately 4 months ago.

## 2013-07-23 NOTE — Progress Notes (Signed)
  Subjective:    CC: Followup  HPI: Carpometacarpal arthritis: Last injection was approximately 4 months ago, good response until now, desires repeat. Pain is localized, moderate, persistent without radiation.  Bilateral foot pain: He has 2 sets of custom orthotics that worked very well, and his desire we add additional long arch support.  Past medical history, Surgical history, Family history not pertinant except as noted below, Social history, Allergies, and medications have been entered into the medical record, reviewed, and no changes needed.   Review of Systems: No fevers, chills, night sweats, weight loss, chest pain, or shortness of breath.   Objective:    General: Well Developed, well nourished, and in no acute distress.  Neuro: Alert and oriented x3, extra-ocular muscles intact, sensation grossly intact.  HEENT: Normocephalic, atraumatic, pupils equal round reactive to light, neck supple, no masses, no lymphadenopathy, thyroid nonpalpable.  Skin: Warm and dry, no rashes. Cardiac: Regular rate and rhythm, no murmurs rubs or gallops, no lower extremity edema.  Respiratory: Clear to auscultation bilaterally. Not using accessory muscles, speaking in full sentences.  Procedure: Real-time Ultrasound Guided Injection of left trapezial metacarpal joint Device: GE Logiq E  Verbal informed consent obtained.  Time-out conducted.  Noted no overlying erythema, induration, or other signs of local infection.  Skin prepped in a sterile fashion.  Local anesthesia: Topical Ethyl chloride.  With sterile technique and under real time ultrasound guidance:  0.5 cc Kenalog 40 pounds or 0.5 cc lidocaine injected easily between the base of the first metacarpal and the trapezium bone. Completed without difficulty  Pain immediately resolved suggesting accurate placement of the medication.  Advised to call if fevers/chills, erythema, induration, drainage, or persistent bleeding.  Images permanently  stored and available for review in the ultrasound unit.  Impression: Technically successful ultrasound guided injection.  Procedure: Real-time Ultrasound Guided Injection of right trapezial metacarpal joint Device: GE Logiq E  Verbal informed consent obtained.  Time-out conducted.  Noted no overlying erythema, induration, or other signs of local infection.  Skin prepped in a sterile fashion.  Local anesthesia: Topical Ethyl chloride.  With sterile technique and under real time ultrasound guidance:  0.5 cc Kenalog 40 pounds or 0.5 cc lidocaine injected easily between the base of the first metacarpal and the trapezium bone. Completed without difficulty  Pain immediately resolved suggesting accurate placement of the medication.  Advised to call if fevers/chills, erythema, induration, drainage, or persistent bleeding.  Images permanently stored and available for review in the ultrasound unit.  Impression: Technically successful ultrasound guided injection.  Medium scaphoid pads were placed in both orthotics.  Impression and Recommendations:

## 2013-07-30 LAB — COMPLETE METABOLIC PANEL WITH GFR
ALT: 29 U/L (ref 0–53)
AST: 17 U/L (ref 0–37)
Albumin: 4.1 g/dL (ref 3.5–5.2)
Alkaline Phosphatase: 62 U/L (ref 39–117)
BUN: 21 mg/dL (ref 6–23)
CALCIUM: 8.9 mg/dL (ref 8.4–10.5)
CHLORIDE: 104 meq/L (ref 96–112)
CO2: 23 meq/L (ref 19–32)
CREATININE: 1.02 mg/dL (ref 0.50–1.35)
GFR, Est African American: >89 mL/min
GFR, Est Non African American: 79 mL/min
Glucose, Bld: 90 mg/dL (ref 70–99)
POTASSIUM: 4.4 meq/L (ref 3.5–5.3)
Sodium: 134 mEq/L — ABNORMAL LOW (ref 135–145)
Total Bilirubin: 0.7 mg/dL (ref 0.2–1.2)
Total Protein: 6.3 g/dL (ref 6.0–8.3)

## 2013-07-30 LAB — LIPID PANEL
Cholesterol: 166 mg/dL (ref 0–200)
HDL: 54 mg/dL (ref >39)
LDL CALC: 103 mg/dL — AB (ref 0–99)
TRIGLYCERIDES: 47 mg/dL (ref <150)
Total CHOL/HDL Ratio: 3.1 Ratio
VLDL: 9 mg/dL (ref 0–40)

## 2013-07-31 LAB — PSA: PSA: 0.65 ng/mL (ref <=4.00)

## 2013-08-02 ENCOUNTER — Ambulatory Visit (INDEPENDENT_AMBULATORY_CARE_PROVIDER_SITE_OTHER): Payer: BC Managed Care – PPO | Admitting: Sports Medicine

## 2013-08-02 ENCOUNTER — Encounter: Payer: Self-pay | Admitting: Sports Medicine

## 2013-08-02 VITALS — BP 140/84 | HR 75 | Ht 71.0 in | Wt 210.0 lb

## 2013-08-02 DIAGNOSIS — M65831 Other synovitis and tenosynovitis, right forearm: Secondary | ICD-10-CM | POA: Insufficient documentation

## 2013-08-02 DIAGNOSIS — R059 Cough, unspecified: Secondary | ICD-10-CM | POA: Insufficient documentation

## 2013-08-02 DIAGNOSIS — M19049 Primary osteoarthritis, unspecified hand: Secondary | ICD-10-CM

## 2013-08-02 DIAGNOSIS — M65832 Other synovitis and tenosynovitis, left forearm: Principal | ICD-10-CM

## 2013-08-02 DIAGNOSIS — M65839 Other synovitis and tenosynovitis, unspecified forearm: Secondary | ICD-10-CM

## 2013-08-02 DIAGNOSIS — R05 Cough: Secondary | ICD-10-CM | POA: Insufficient documentation

## 2013-08-02 DIAGNOSIS — M65849 Other synovitis and tenosynovitis, unspecified hand: Secondary | ICD-10-CM

## 2013-08-02 MED ORDER — HYDROCODONE-HOMATROPINE 5-1.5 MG/5ML PO SYRP: 5.0000 mL | ORAL_SOLUTION | Freq: Three times a day (TID) | ORAL | Status: DC | PRN

## 2013-08-02 NOTE — Assessment & Plan Note (Signed)
Hycodan as needed.

## 2013-08-02 NOTE — Assessment & Plan Note (Signed)
This is a separate pathology from his thumb arthritis. Injections performed. Return in one month.

## 2013-08-02 NOTE — Progress Notes (Signed)
  Subjective:    CC: Bilateral forearm pain  HPI: For the past couple of days this pleasant 63 year old male is experienced increasing pain over the dorsum of his forearm along the midshaft bilaterally. He has some overlying redness, significant crepitus and pain. He denies/declines any trauma or recent overuse however his job does involve repetitive motions. Pain is severe, persistent.  Past medical history, Surgical history, Family history not pertinant except as noted below, Social history, Allergies, and medications have been entered into the medical record, reviewed, and no changes needed.   Review of Systems: No fevers, chills, night sweats, weight loss, chest pain, or shortness of breath.   Objective:    General: Well Developed, well nourished, and in no acute distress.  Neuro: Alert and oriented x3, extra-ocular muscles intact, sensation grossly intact.  HEENT: Normocephalic, atraumatic, pupils equal round reactive to light, neck supple, no masses, no lymphadenopathy, thyroid nonpalpable.  Skin: Warm and dry, no rashes. Cardiac: Regular rate and rhythm, no murmurs rubs or gallops, no lower extremity edema.  Respiratory: Clear to auscultation bilaterally. Not using accessory muscles, speaking in full sentences. Forearms: There is overlying erythema without induration, there is palpable crepitus over the resection of the first and second extensor compartments at the mid forearm. Negative Finkelstein sign bilaterally.  Procedure: Real-time Ultrasound Guided Injection of intersection of first and second extensor compartments in the mid forearm on the left Device: GE Logiq E  Verbal informed consent obtained.  Time-out conducted.  Noted no overlying erythema, induration, or other signs of local infection.  Skin prepped in a sterile fashion.  Local anesthesia: Topical Ethyl chloride.  With sterile technique and under real time ultrasound guidance:   Noted fluid around the intersection,  1 cc Kenalog 40, 4 cc lidocaine injected easily. Completed without difficulty  Pain immediately resolved suggesting accurate placement of the medication.  Advised to call if fevers/chills, erythema, induration, drainage, or persistent bleeding.  Images permanently stored and available for review in the ultrasound unit.  Impression: Technically successful ultrasound guided injection.  Procedure: Real-time Ultrasound Guided Injection of intersection of first and second extensor compartments in the mid forearm on the right Device: GE Logiq E  Verbal informed consent obtained.  Time-out conducted.  Noted no overlying erythema, induration, or other signs of local infection.  Skin prepped in a sterile fashion.  Local anesthesia: Topical Ethyl chloride.  With sterile technique and under real time ultrasound guidance:   Noted fluid around the intersection, 1 cc Kenalog 40, 4 cc lidocaine injected easily. Completed without difficulty  Pain immediately resolved suggesting accurate placement of the medication.  Advised to call if fevers/chills, erythema, induration, drainage, or persistent bleeding.  Images permanently stored and available for review in the ultrasound unit.  Impression: Technically successful ultrasound guided injection.  Impression and Recommendations:

## 2013-08-02 NOTE — Assessment & Plan Note (Signed)
Continued to do extremely well after bilateral injections.

## 2013-08-06 ENCOUNTER — Ambulatory Visit (INDEPENDENT_AMBULATORY_CARE_PROVIDER_SITE_OTHER): Payer: BC Managed Care – PPO | Admitting: Sports Medicine

## 2013-08-06 ENCOUNTER — Encounter: Payer: BC Managed Care – PPO | Admitting: Family Medicine

## 2013-08-06 ENCOUNTER — Encounter: Payer: Self-pay | Admitting: Sports Medicine

## 2013-08-06 VITALS — BP 130/90 | HR 84 | Temp 97.4°F | Ht 71.0 in | Wt 211.0 lb

## 2013-08-06 DIAGNOSIS — M65839 Other synovitis and tenosynovitis, unspecified forearm: Secondary | ICD-10-CM

## 2013-08-06 DIAGNOSIS — R05 Cough: Secondary | ICD-10-CM

## 2013-08-06 DIAGNOSIS — M65832 Other synovitis and tenosynovitis, left forearm: Secondary | ICD-10-CM

## 2013-08-06 DIAGNOSIS — M65831 Other synovitis and tenosynovitis, right forearm: Secondary | ICD-10-CM

## 2013-08-06 DIAGNOSIS — M19049 Primary osteoarthritis, unspecified hand: Secondary | ICD-10-CM

## 2013-08-06 DIAGNOSIS — R059 Cough, unspecified: Secondary | ICD-10-CM

## 2013-08-06 DIAGNOSIS — M65849 Other synovitis and tenosynovitis, unspecified hand: Secondary | ICD-10-CM

## 2013-08-06 MED ORDER — HYDROCODONE-HOMATROPINE 5-1.5 MG/5ML PO SYRP: 5.0000 mL | ORAL_SOLUTION | Freq: Three times a day (TID) | ORAL | Status: DC | PRN

## 2013-08-06 NOTE — Progress Notes (Signed)
  Subjective:    CC: Cough  HPI: This pleasant 63 year old male continues to have a cough, I gave him very little cough syrup last time and he is now out of it, he is improving but just needs some more cough syrup.  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  Carpometacarpal arthritis: Continues to be resolved after injection months ago.  Past medical history, Surgical history, Family history not pertinant except as noted below, Social history, Allergies, and medications have been entered into the medical record, reviewed, and no changes needed.   Review of Systems: No fevers, chills, night sweats, weight loss, chest pain, or shortness of breath.   Objective:    General: Well Developed, well nourished, and in no acute distress.  Neuro: Alert and oriented x3, extra-ocular muscles intact, sensation grossly intact.  HEENT: Normocephalic, atraumatic, pupils equal round reactive to light, neck supple, no masses, no lymphadenopathy, thyroid nonpalpable.  Skin: Warm and dry, no rashes. Cardiac: Regular rate and rhythm, no murmurs rubs or gallops, no lower extremity edema.  Respiratory: Clear to auscultation bilaterally. Not using accessory muscles, speaking in full sentences.  Impression and Recommendations:

## 2013-08-06 NOTE — Assessment & Plan Note (Signed)
Continued to be pain-free after injection. 

## 2013-08-06 NOTE — Assessment & Plan Note (Signed)
Continues to be viral, he is out of Hycodan, refilling.

## 2013-08-06 NOTE — Assessment & Plan Note (Signed)
Pain-free after injection. 

## 2013-08-13 ENCOUNTER — Encounter: Payer: BC Managed Care – PPO | Admitting: Family Medicine

## 2013-08-20 ENCOUNTER — Ambulatory Visit (INDEPENDENT_AMBULATORY_CARE_PROVIDER_SITE_OTHER): Payer: BC Managed Care – PPO | Admitting: Sports Medicine

## 2013-08-20 ENCOUNTER — Encounter: Payer: Self-pay | Admitting: Sports Medicine

## 2013-08-20 VITALS — BP 135/92 | HR 70 | Ht 71.0 in | Wt 207.0 lb

## 2013-08-20 DIAGNOSIS — Z299 Encounter for prophylactic measures, unspecified: Secondary | ICD-10-CM | POA: Insufficient documentation

## 2013-08-20 DIAGNOSIS — M19049 Primary osteoarthritis, unspecified hand: Secondary | ICD-10-CM

## 2013-08-20 DIAGNOSIS — Z1211 Encounter for screening for malignant neoplasm of colon: Secondary | ICD-10-CM

## 2013-08-20 DIAGNOSIS — M65831 Other synovitis and tenosynovitis, right forearm: Secondary | ICD-10-CM

## 2013-08-20 DIAGNOSIS — Z0000 Encounter for general adult medical examination without abnormal findings: Secondary | ICD-10-CM

## 2013-08-20 DIAGNOSIS — M65849 Other synovitis and tenosynovitis, unspecified hand: Secondary | ICD-10-CM

## 2013-08-20 DIAGNOSIS — M65839 Other synovitis and tenosynovitis, unspecified forearm: Secondary | ICD-10-CM

## 2013-08-20 DIAGNOSIS — M65832 Other synovitis and tenosynovitis, left forearm: Secondary | ICD-10-CM

## 2013-08-20 LAB — HEMOCCULT GUIAC POC 1CARD (OFFICE): Fecal Occult Blood, POC: NEGATIVE

## 2013-08-20 MED ORDER — IBUPROFEN 800 MG PO TABS: 800.0000 mg | ORAL_TABLET | Freq: Three times a day (TID) | ORAL | Status: DC | PRN

## 2013-08-20 MED ORDER — NAPROXEN 500 MG PO TABS: 500.0000 mg | ORAL_TABLET | Freq: Every day | ORAL | Status: DC | PRN

## 2013-08-20 NOTE — Assessment & Plan Note (Signed)
Will need a repeat CT scan in May of this year to followup his pulmonary nodule.

## 2013-08-20 NOTE — Assessment & Plan Note (Signed)
Continues to be resolved after injection several months ago. He is taking over-the-counter Aleve, I'm going to give him 500 mg naproxen, he does also like to use ibuprofen near bedtime, I'm going to give him some 800, he does understand not to use these together.

## 2013-08-20 NOTE — Assessment & Plan Note (Signed)
Relatively well controlled, no changes today.

## 2013-08-20 NOTE — Assessment & Plan Note (Signed)
Continues to do well, gets occasional flares.

## 2013-08-20 NOTE — Progress Notes (Signed)
  Subjective:    CC: Complete physical  HPI:  Hypertension: Slightly elevated today but no headaches, chest pain, shortness of breath, lower extremity swelling.  Pulmonary nodule: Needs a repeat CT scan in May of this year.  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  Trapezial metacarpal joint arthritis: Continues to be resolved after injection month ago.  Past medical history, Surgical history, Family history not pertinant except as noted below, Social history, Allergies, and medications have been entered into the medical record, reviewed, and no changes needed.   Review of Systems: No headache, visual changes, nausea, vomiting, diarrhea, constipation, dizziness, abdominal pain, skin rash, fevers, chills, night sweats, swollen lymph nodes, weight loss, chest pain, body aches, joint swelling, muscle aches, shortness of breath, mood changes, visual or auditory hallucinations.  Objective:    General Appearance: Alert, well developed and well nourished, cooperative, no distress.  Head: Normocephalic, no obvious abnormality  Eyes: PERRL, EOM's intact, conjunctiva and corneas clear.  Nose: Nares symmetrical, septum midline, mucosa pink, clear watery discharge; no sinus tenderness  Throat: Lips, tongue, and mucosa are moist, pink, and intact; teeth intact  Neck: Supple, symmetrical, trachea midline, no adenopathy; thyroid: no enlargement, symmetric,no tenderness/mass/nodules; no carotid bruit, no JVD  Back: Symmetrical, no curvature, ROM normal, no CVA tenderness  Chest/Breast: No mass or tenderness  Lungs: Clear to auscultation bilaterally, respirations unlabored  Heart: Normal PMI, no lower extremity edema, regular rate & rhythm, S1 and S2 normal, no murmurs, rubs, or gallops. Abdomen: Soft, non-tender, bowel sounds active all four quadrants, no mass, or organomegaly  Musculoskeletal: All joints, extremities examined, non-tender  and unremarkable.  Lymphatic: No adenopathy  Skin/Hair/Nails: Skin warm, dry, and intact, no rashes or abnormal dyspigmentation  Neurologic: Alert and oriented x3, no cranial nerve deficits, normal strength and tone, gait steady   Hemoccult-negative.  Impression and Recommendations:    The patient was counselled, risk factors were discussed, anticipatory guidance given.

## 2013-08-20 NOTE — Assessment & Plan Note (Signed)
Complete physical performed today. Hemoccult negative. Return in one year to see PCP for future physicals.

## 2013-10-24 ENCOUNTER — Ambulatory Visit (INDEPENDENT_AMBULATORY_CARE_PROVIDER_SITE_OTHER): Payer: BC Managed Care – PPO | Admitting: Sports Medicine

## 2013-10-24 ENCOUNTER — Encounter: Payer: Self-pay | Admitting: Sports Medicine

## 2013-10-24 VITALS — BP 127/84 | HR 61 | Ht 70.0 in | Wt 212.0 lb

## 2013-10-24 DIAGNOSIS — M65832 Other synovitis and tenosynovitis, left forearm: Secondary | ICD-10-CM

## 2013-10-24 DIAGNOSIS — M65831 Other synovitis and tenosynovitis, right forearm: Secondary | ICD-10-CM

## 2013-10-24 DIAGNOSIS — M19049 Primary osteoarthritis, unspecified hand: Secondary | ICD-10-CM

## 2013-10-24 NOTE — Assessment & Plan Note (Signed)
Continues to be resolved after guided injection and intermittent bracing at a previous visit.

## 2013-10-24 NOTE — Progress Notes (Signed)
  Subjective:    CC: Followup  HPI: Trapeziometacarpal joint osteoarthritis : Joshua Warner returns, we have been doing injections into his first carpometacarpal joint bilaterally, the last of which was 3 months ago. His injections are typically been lasting several months, but more recently this would only lasted 2 months. He desires repeat intervention. Pain is severe, persistent, localized at both trapeziometacarpal joints  Past medical history, Surgical history, Family history not pertinant except as noted below, Social history, Allergies, and medications have been entered into the medical record, reviewed, and no changes needed.   Review of Systems: No fevers, chills, night sweats, weight loss, chest pain, or shortness of breath.   Objective:    General: Well Developed, well nourished, and in no acute distress.  Neuro: Alert and oriented x3, extra-ocular muscles intact, sensation grossly intact.  HEENT: Normocephalic, atraumatic, pupils equal round reactive to light, neck supple, no masses, no lymphadenopathy, thyroid nonpalpable.  Skin: Warm and dry, no rashes. Cardiac: Regular rate and rhythm, no murmurs rubs or gallops, no lower extremity edema.  Respiratory: Clear to auscultation bilaterally. Not using accessory muscles, speaking in full sentences.  Procedure: Real-time Ultrasound Guided Injection of left trapeziometacarpal joint Device: GE Logiq E  Verbal informed consent obtained.  Time-out conducted.  Noted no overlying erythema, induration, or other signs of local infection.  Skin prepped in a sterile fashion.  Local anesthesia: Topical Ethyl chloride.  With sterile technique and under real time ultrasound guidance:  25-gauge needle advanced in the joint, 0.5 cc Kenalog 40, 0.5 cc lidocaine injected easily. Completed without difficulty  Pain immediately resolved suggesting accurate placement of the medication.  Advised to call if fevers/chills, erythema, induration, drainage, or  persistent bleeding.  Images permanently stored and available for review in the ultrasound unit.  Impression: Technically successful ultrasound guided injection.  Procedure: Real-time Ultrasound Guided Injection of right trapeziometacarpal joint Device: GE Logiq E  Verbal informed consent obtained.  Time-out conducted.  Noted no overlying erythema, induration, or other signs of local infection.  Skin prepped in a sterile fashion.  Local anesthesia: Topical Ethyl chloride.  With sterile technique and under real time ultrasound guidance:  25-gauge needle advanced in the joint, 0.5 cc Kenalog 40, 0.5 cc lidocaine injected easily. Completed without difficulty  Pain immediately resolved suggesting accurate placement of the medication.  Advised to call if fevers/chills, erythema, induration, drainage, or persistent bleeding.  Images permanently stored and available for review in the ultrasound unit.  Impression: Technically successful ultrasound guided injection.  Impression and Recommendations:

## 2013-10-24 NOTE — Assessment & Plan Note (Addendum)
Last injection was over 3 months ago, repeat injection into the trapezial metacarpal joint bilaterally. Injection this time lasted 2 months, at this point I'm going to refer him to Dr. Grandville Silos with hand surgery for consideration of thumb suspension.

## 2013-11-13 ENCOUNTER — Telehealth: Payer: Self-pay | Admitting: Family Medicine

## 2013-11-13 ENCOUNTER — Telehealth: Payer: Self-pay | Admitting: *Deleted

## 2013-11-13 ENCOUNTER — Other Ambulatory Visit: Payer: Self-pay | Admitting: Family Medicine

## 2013-11-13 DIAGNOSIS — I10 Essential (primary) hypertension: Secondary | ICD-10-CM

## 2013-11-13 DIAGNOSIS — R911 Solitary pulmonary nodule: Secondary | ICD-10-CM

## 2013-11-13 NOTE — Telephone Encounter (Signed)
PA obtained for CT Chest w/contrast.  Auth # [id number].  Exp. 12/12/13.  Oscar La, LPN

## 2013-11-13 NOTE — Telephone Encounter (Signed)
Need BUN and Creatnine due to needing contrast for CT Chest scan.  Oscar La, LPN

## 2013-11-13 NOTE — Telephone Encounter (Signed)
Ok will place order

## 2013-11-13 NOTE — Telephone Encounter (Signed)
Patient called and request to have a order for a scan in imaging of his lungs a follow up repeat scan from last year. [phone] Thanks

## 2013-11-14 LAB — BUN: BUN: 20 mg/dL (ref 6–23)

## 2013-11-14 LAB — CREATININE, SERUM: Creat: 1.04 mg/dL (ref 0.50–1.35)

## 2013-11-14 LAB — BUN+CREAT: BUN / CREAT RATIO: 19.2 ratio

## 2013-11-20 ENCOUNTER — Ambulatory Visit (INDEPENDENT_AMBULATORY_CARE_PROVIDER_SITE_OTHER): Payer: BC Managed Care – PPO

## 2013-11-20 DIAGNOSIS — R911 Solitary pulmonary nodule: Secondary | ICD-10-CM

## 2013-11-20 DIAGNOSIS — I889 Nonspecific lymphadenitis, unspecified: Secondary | ICD-10-CM

## 2013-11-20 DIAGNOSIS — J8410 Pulmonary fibrosis, unspecified: Secondary | ICD-10-CM

## 2013-11-20 DIAGNOSIS — N200 Calculus of kidney: Secondary | ICD-10-CM

## 2013-11-20 DIAGNOSIS — I2510 Atherosclerotic heart disease of native coronary artery without angina pectoris: Secondary | ICD-10-CM

## 2013-12-14 ENCOUNTER — Ambulatory Visit (INDEPENDENT_AMBULATORY_CARE_PROVIDER_SITE_OTHER): Payer: BC Managed Care – PPO | Admitting: Sports Medicine

## 2013-12-14 ENCOUNTER — Encounter: Payer: Self-pay | Admitting: Family Medicine

## 2013-12-14 ENCOUNTER — Ambulatory Visit (INDEPENDENT_AMBULATORY_CARE_PROVIDER_SITE_OTHER): Payer: BC Managed Care – PPO | Admitting: Family Medicine

## 2013-12-14 VITALS — BP 138/88 | HR 78 | Wt 213.0 lb

## 2013-12-14 DIAGNOSIS — L570 Actinic keratosis: Secondary | ICD-10-CM

## 2013-12-14 DIAGNOSIS — Z23 Encounter for immunization: Secondary | ICD-10-CM

## 2013-12-14 DIAGNOSIS — M7122 Synovial cyst of popliteal space [Baker], left knee: Secondary | ICD-10-CM

## 2013-12-14 DIAGNOSIS — M7120 Synovial cyst of popliteal space [Baker], unspecified knee: Secondary | ICD-10-CM

## 2013-12-14 NOTE — Progress Notes (Signed)
   Subjective:    Patient ID: Joshua Warner, male    DOB: 1950-07-21, 63 y.o.   MRN: [id number]  HPI Would like to get shingles vaccines.     Actinic keratoses-refer to some a few on his head recently. He has some a couple new one put her on his left temple which is near the isthmus exposure when driving in the car.   Review of Systems     Objective:   Physical Exam  Constitutional: He is oriented to person, place, and time. He appears well-developed and well-nourished.  HENT:  Head: Normocephalic and atraumatic.    He has 5 actinic keratoses on the left temple and over the upper left jaw bone.  Neurological: He is alert and oriented to person, place, and time.  Skin: Skin is warm and dry.  Psychiatric: He has a normal mood and affect. His behavior is normal.          Assessment & Plan:  Shingles vaccine given today.  Actinic keratoses-recommend cryotherapy. He does have a family history skin cancer. Patient tolerated the procedure well.  Cryotherapy Procedure Note  Pre-operative Diagnosis: Actinic keratosis  Post-operative Diagnosis: Actinic keratosis  Locations: right  temple and face  Indications: none  Anesthesia: not required    Procedure Details  Patient informed of risks (permanent scarring, infection, light or dark discoloration, bleeding, infection, weakness, numbness and recurrence of the lesion) and benefits of the procedure and verbal informed consent obtained.  The areas are treated with liquid nitrogen therapy, frozen until ice ball extended 2 mm beyond lesion, allowed to thaw, and treated again. The patient tolerated procedure well.  The patient was instructed on post-op care, warned that there may be blister formation, redness and pain. Recommend OTC analgesia as needed for pain.  Condition: Stable  Complications: none.  Plan: 1. Instructed to keep the area dry and covered for 24-48h and clean thereafter. 2. Warning signs of infection were  reviewed.   3. Recommended that the patient use OTC acetaminophen as needed for pain.  4. Return prn.

## 2013-12-14 NOTE — Assessment & Plan Note (Addendum)
Aspiration of 40cc and injection as above. Return in 4 weeks. Last aspiration and injection was a year ago.

## 2013-12-14 NOTE — Progress Notes (Signed)
  Subjective:    CC: Baker's cyst  HPI: Joshua Warner returns, I aspirated his left knee Baker's cyst approximately a year ago, just now he's having a recurrence. Pain is moderate, persistent.  Past medical history, Surgical history, Family history not pertinant except as noted below, Social history, Allergies, and medications have been entered into the medical record, reviewed, and no changes needed.   Review of Systems: No fevers, chills, night sweats, weight loss, chest pain, or shortness of breath.   Objective:    General: Well Developed, well nourished, and in no acute distress.  Neuro: Alert and oriented x3, extra-ocular muscles intact, sensation grossly intact.  HEENT: Normocephalic, atraumatic, pupils equal round reactive to light, neck supple, no masses, no lymphadenopathy, thyroid nonpalpable.  Skin: Warm and dry, no rashes. Cardiac: Regular rate and rhythm, no murmurs rubs or gallops, no lower extremity edema.  Respiratory: Clear to auscultation bilaterally. Not using accessory muscles, speaking in full sentences. Left Knee: Normal to inspection with no erythema or effusion or obvious bony abnormalities. Probable Baker cyst at the crux between the semimembranosus in the medial head of the gastrocnemius. ROM full in flexion and extension and lower leg rotation. Ligaments with solid consistent endpoints including ACL, PCL, LCL, MCL. Negative Mcmurray's, Apley's, and Thessalonian tests. Non painful patellar compression. Patellar glide without crepitus. Patellar and quadriceps tendons unremarkable. Hamstring and quadriceps strength is normal.   Procedure: Real-time Ultrasound Guided aspiration/Injection of left Baker's cyst Device: GE Logiq E  Verbal informed consent obtained.  Time-out conducted.  Noted no overlying erythema, induration, or other signs of local infection.  Skin prepped in a sterile fashion.  Local anesthesia: Topical Ethyl chloride.  With sterile technique and  under real time ultrasound guidance:  40 cc of thick straw-colored fluid aspirated, syringe switched and 2 cc Kenalog 40, 4 cc lidocaine injected easily. Completed without difficulty  Pain immediately resolved suggesting accurate placement of the medication.  Advised to call if fevers/chills, erythema, induration, drainage, or persistent bleeding.  Images permanently stored and available for review in the ultrasound unit.  Impression: Technically successful ultrasound guided injection.  Impression and Recommendations:

## 2013-12-17 MED ORDER — ZOSTER VACCINE LIVE 19400 UNT/0.65ML ~~LOC~~ SOLR: 0.6500 mL | Freq: Once | SUBCUTANEOUS | Status: DC

## 2013-12-17 NOTE — Addendum Note (Signed)
Addended by: Teddy Spike on: 12/17/2013 06:51 PM   Modules accepted: Orders

## 2013-12-20 ENCOUNTER — Telehealth: Payer: Self-pay | Admitting: *Deleted

## 2013-12-20 NOTE — Telephone Encounter (Signed)
Pt wanted copy of ct of chest. Printed and placed up front .Joshua Warner

## 2014-01-17 ENCOUNTER — Ambulatory Visit (INDEPENDENT_AMBULATORY_CARE_PROVIDER_SITE_OTHER): Payer: BC Managed Care – PPO | Admitting: Sports Medicine

## 2014-01-17 ENCOUNTER — Encounter: Payer: Self-pay | Admitting: Sports Medicine

## 2014-01-17 VITALS — BP 140/88 | HR 76 | Ht 71.0 in | Wt 210.0 lb

## 2014-01-17 DIAGNOSIS — R21 Rash and other nonspecific skin eruption: Secondary | ICD-10-CM | POA: Diagnosis not present

## 2014-01-17 DIAGNOSIS — M19049 Primary osteoarthritis, unspecified hand: Secondary | ICD-10-CM | POA: Diagnosis not present

## 2014-01-17 MED ORDER — CLOTRIMAZOLE-BETAMETHASONE 1-0.05 % EX CREA: 1.0000 "application " | TOPICAL_CREAM | Freq: Two times a day (BID) | CUTANEOUS | Status: DC

## 2014-01-17 NOTE — Progress Notes (Signed)
  Subjective:    CC: Thumb pain  HPI: This is a very pleasant 63 year old male, I have been treating him in the past for trapeziometacarpal joint osteoarthritis, his last injection was 3 months ago, he is now having a recurrence of pain, moderate, persistent. He retires next month, and thinks that the rest will improve his symptoms. He does desire repeat interventional treatment.  Left Baker's cyst: Resolved with aspiration and injection the last visit.   He has also been wearing his kneepads for a prolonged period of time and has developed an itchy rash on his knees.  Past medical history, Surgical history, Family history not pertinant except as noted below, Social history, Allergies, and medications have been entered into the medical record, reviewed, and no changes needed.   Review of Systems: No fevers, chills, night sweats, weight loss, chest pain, or shortness of breath.   Objective:    General: Well Developed, well nourished, and in no acute distress.  Neuro: Alert and oriented x3, extra-ocular muscles intact, sensation grossly intact.  HEENT: Normocephalic, atraumatic, pupils equal round reactive to light, neck supple, no masses, no lymphadenopathy, thyroid nonpalpable.  Skin: Warm and dry,  there is a macular rash located on both knees, there is signs of mild excoriation. There is some scaling of the leading edge of the rash suggestive of fungal etiology.  Cardiac: Regular rate and rhythm, no murmurs rubs or gallops, no lower extremity edema.  Respiratory: Clear to auscultation bilaterally. Not using accessory muscles, speaking in full sentences.  Procedure: Real-time Ultrasound Guided Injection of left trapeziometacarpal joint Device: GE Logiq E  Verbal informed consent obtained.  Time-out conducted.  Noted no overlying erythema, induration, or other signs of local infection.  Skin prepped in a sterile fashion.  Local anesthesia: Topical Ethyl chloride.  With sterile  technique and under real time ultrasound guidance:  0.5 cc kenalog 40, 0.5 cc lidocaine injected easily. Completed without difficulty  Pain immediately resolved suggesting accurate placement of the medication.  Advised to call if fevers/chills, erythema, induration, drainage, or persistent bleeding.  Images permanently stored and available for review in the ultrasound unit.  Impression: Technically successful ultrasound guided injection.  Procedure: Real-time Ultrasound Guided Injection of right trapeziometacarpal joint Device: GE Logiq E  Verbal informed consent obtained.  Time-out conducted.  Noted no overlying erythema, induration, or other signs of local infection.  Skin prepped in a sterile fashion.  Local anesthesia: Topical Ethyl chloride.  With sterile technique and under real time ultrasound guidance:  0.5 cc kenalog 40, 0.5 cc lidocaine injected easily. Completed without difficulty  Pain immediately resolved suggesting accurate placement of the medication.  Advised to call if fevers/chills, erythema, induration, drainage, or persistent bleeding.  Images permanently stored and available for review in the ultrasound unit.  Impression: Technically successful ultrasound guided injection.  Impression and Recommendations:

## 2014-01-17 NOTE — Assessment & Plan Note (Signed)
Early fungal after wearing kneepads extensively. Lotrisone.

## 2014-01-17 NOTE — Assessment & Plan Note (Signed)
Last injections worked 3 months, repeat bilateral [facility] injection as above.

## 2014-03-09 ENCOUNTER — Encounter: Payer: Self-pay | Admitting: Emergency Medicine

## 2014-03-09 ENCOUNTER — Emergency Department (INDEPENDENT_AMBULATORY_CARE_PROVIDER_SITE_OTHER)
Admission: EM | Admit: 2014-03-09 | Discharge: 2014-03-09 | Disposition: A | Discharge status: Home / Self Care | Payer: BC Managed Care – PPO | Source: Home / Self Care | Attending: Family Medicine | Admitting: Family Medicine

## 2014-03-09 DIAGNOSIS — J209 Acute bronchitis, unspecified: Secondary | ICD-10-CM

## 2014-03-09 MED ORDER — HYDROCODONE-HOMATROPINE 5-1.5 MG/5ML PO SYRP: 5.0000 mL | ORAL_SOLUTION | Freq: Four times a day (QID) | ORAL | Status: DC | PRN

## 2014-03-09 MED ORDER — IPRATROPIUM-ALBUTEROL 0.5-2.5 (3) MG/3ML IN SOLN: 3.0000 mL | Freq: Once | RESPIRATORY_TRACT | Status: DC

## 2014-03-09 MED ORDER — AZITHROMYCIN 250 MG PO TABS: 250.0000 mg | ORAL_TABLET | Freq: Every day | ORAL | Status: DC

## 2014-03-09 MED ORDER — IPRATROPIUM BROMIDE 0.06 % NA SOLN: 2.0000 | Freq: Four times a day (QID) | NASAL | Status: DC

## 2014-03-09 MED ORDER — PREDNISONE 10 MG PO TABS: 30.0000 mg | ORAL_TABLET | Freq: Every day | ORAL | Status: DC

## 2014-03-09 NOTE — ED Notes (Signed)
Began feeling symptoms of URI 2 weeks ago; has progressed to chest congestion discomfort and dizziness early this morning.

## 2014-03-09 NOTE — ED Provider Notes (Signed)
Joshua Warner is a 63 y.o. male who presents to Urgent Care today for 2 weeks of cough congestion nasal discharge and postnasal drip. Cough is somewhat productive. Patient had one episode of vertigo this morning but none since. He notes some wheezing. He's used hydrocodone-based cough medicine which has helped a little. Symptoms are consistent with prior episodes of bronchitis. No smoking no history of asthma.   Past Medical History  Diagnosis Date  . Hypertension since 2007  . Kidney stones 2009   History  Substance Use Topics  . Smoking status: Never Smoker   . Smokeless tobacco: Never Used  . Alcohol Use: 2.0 oz/week    4 drink(s) per week   ROS as above Medications: Current Facility-Administered Medications  Medication Dose Route Frequency Provider Last Rate Last Dose  . ipratropium-albuterol (DUONEB) 0.5-2.5 (3) MG/3ML nebulizer solution 3 mL  3 mL Nebulization Once Gregor Hams, MD       Current Outpatient Prescriptions  Medication Sig Dispense Refill  . azithromycin (ZITHROMAX) 250 MG tablet Take 1 tablet (250 mg total) by mouth daily. Take first 2 tablets together, then 1 every day until finished.  6 tablet  0  . b complex vitamins tablet Take 1 tablet by mouth daily.      . calcium carbonate (OS-CAL) 600 MG TABS Take 600 mg by mouth daily.      . clotrimazole-betamethasone (LOTRISONE) cream Apply 1 application topically 2 (two) times daily.  45 g  0  . Glucosamine-Chondroit-Vit C-Mn (GLUCOSAMINE CHONDR 1500 COMPLX) CAPS Take by mouth daily.        Marland Kitchen HYDROcodone-homatropine (HYCODAN) 5-1.5 MG/5ML syrup Take 5 mLs by mouth every 6 (six) hours as needed for cough.  120 mL  0  . ibuprofen (ADVIL,MOTRIN) 800 MG tablet Take 1 tablet (800 mg total) by mouth every 8 (eight) hours as needed.  60 tablet  2  . ipratropium (ATROVENT) 0.06 % nasal spray Place 2 sprays into both nostrils 4 (four) times daily.  15 mL  1  . loratadine (CLARITIN) 10 MG tablet Take 10 mg by mouth daily.       Marland Kitchen losartan (COZAAR) 100 MG tablet Take 1 tablet (100 mg total) by mouth daily.  90 tablet  3  . Multiple Vitamin (MULTIVITAMIN) tablet Take 1 tablet by mouth daily.        . naproxen (NAPROSYN) 500 MG tablet Take 1 tablet (500 mg total) by mouth daily as needed.  30 tablet  3  . Omega-3 Fatty Acids (FISH OIL) 1000 MG CAPS Take by mouth.      Marland Kitchen omeprazole (PRILOSEC) 20 MG capsule Take 1 capsule (20 mg total) by mouth daily.  90 capsule  3  . predniSONE (DELTASONE) 10 MG tablet Take 3 tablets (30 mg total) by mouth daily.  15 tablet  0    Exam:  BP 156/95  Pulse 79  Temp(Src) 97.6 F (36.4 C) (Oral)  Resp 16  Ht 5\' 11"  (1.803 m)  Wt 210 lb (95.255 kg)  BMI 29.30 kg/m2  SpO2 96% Gen: Well NAD HEENT: EOMI,  MMM posterior pharynx with cobblestoning. Normal tympanic membranes bilaterally. Lungs: Normal work of breathing. CTABL Heart: RRR no MRG Abd: NABS, Soft. Nondistended, Nontender Exts: Brisk capillary refill, warm and well perfused.   Patient was given a DuoNeb nebulizer treatment and felt better.  No results found for this or any previous visit (from the past 24 hour(s)). No results found.  Assessment and Plan: 63 y.o.  male with bronchitis. Treat with prednisone Atrovent nasal spray and hydrocodone-based cough medicine. Azithromycin if not better.  Discussed warning signs or symptoms. Please see discharge instructions. Patient expresses understanding.   This note was created using Systems analyst. Any transcription errors are unintended.    Gregor Hams, MD 03/09/14 1026

## 2014-03-09 NOTE — Discharge Instructions (Signed)
Thank you for coming in today. I think you have bronchitis.  Take prednisone daily. Use Atrovent nasal spray and cough medicine as needed. If not improving start taking azithromycin antibiotic Call or go to the emergency room if you get worse, have trouble breathing, have chest pains, or palpitations.    Acute Bronchitis Bronchitis is inflammation of the airways that extend from the windpipe into the lungs (bronchi). The inflammation often causes mucus to develop. This leads to a cough, which is the most common symptom of bronchitis.  In acute bronchitis, the condition usually develops suddenly and goes away over time, usually in a couple weeks. Smoking, allergies, and asthma can make bronchitis worse. Repeated episodes of bronchitis may cause further lung problems.  CAUSES Acute bronchitis is most often caused by the same virus that causes a cold. The virus can spread from person to person (contagious) through coughing, sneezing, and touching contaminated objects. SIGNS AND SYMPTOMS   Cough.   Fever.   Coughing up mucus.   Body aches.   Chest congestion.   Chills.   Shortness of breath.   Sore throat.  DIAGNOSIS  Acute bronchitis is usually diagnosed through a physical exam. Your health care provider will also ask you questions about your medical history. Tests, such as chest X-rays, are sometimes done to rule out other conditions.  TREATMENT  Acute bronchitis usually goes away in a couple weeks. Oftentimes, no medical treatment is necessary. Medicines are sometimes given for relief of fever or cough. Antibiotic medicines are usually not needed but may be prescribed in certain situations. In some cases, an inhaler may be recommended to help reduce shortness of breath and control the cough. A cool mist vaporizer may also be used to help thin bronchial secretions and make it easier to clear the chest.  HOME CARE INSTRUCTIONS  Get plenty of rest.   Drink enough fluids to  keep your urine clear or pale yellow (unless you have a medical condition that requires fluid restriction). Increasing fluids may help thin your respiratory secretions (sputum) and reduce chest congestion, and it will prevent dehydration.   Take medicines only as directed by your health care provider.  If you were prescribed an antibiotic medicine, finish it all even if you start to feel better.  Avoid smoking and secondhand smoke. Exposure to cigarette smoke or irritating chemicals will make bronchitis worse. If you are a smoker, consider using nicotine gum or skin patches to help control withdrawal symptoms. Quitting smoking will help your lungs heal faster.   Reduce the chances of another bout of acute bronchitis by washing your hands frequently, avoiding people with cold symptoms, and trying not to touch your hands to your mouth, nose, or eyes.   Keep all follow-up visits as directed by your health care provider.  SEEK MEDICAL CARE IF: Your symptoms do not improve after 1 week of treatment.  SEEK IMMEDIATE MEDICAL CARE IF:  You develop an increased fever or chills.   You have chest pain.   You have severe shortness of breath.  You have bloody sputum.   You develop dehydration.  You faint or repeatedly feel like you are going to pass out.  You develop repeated vomiting.  You develop a severe headache. MAKE SURE YOU:   Understand these instructions.  Will watch your condition.  Will get help right away if you are not doing well or get worse. Document Released: 07/22/2004 Document Revised: 10/29/2013 Document Reviewed: 12/05/2012 [facility] Patient Information 2015 [place], [place].  This information is not intended to replace advice given to you by your health care provider. Make sure you discuss any questions you have with your health care provider. ° °

## 2014-03-22 ENCOUNTER — Ambulatory Visit (INDEPENDENT_AMBULATORY_CARE_PROVIDER_SITE_OTHER): Payer: BC Managed Care – PPO | Admitting: Family Medicine

## 2014-03-22 ENCOUNTER — Encounter: Payer: Self-pay | Admitting: Family Medicine

## 2014-03-22 VITALS — BP 131/84 | HR 70 | Wt 219.0 lb

## 2014-03-22 DIAGNOSIS — I10 Essential (primary) hypertension: Secondary | ICD-10-CM

## 2014-03-22 DIAGNOSIS — Z23 Encounter for immunization: Secondary | ICD-10-CM | POA: Diagnosis not present

## 2014-03-22 DIAGNOSIS — Z6832 Body mass index (BMI) 32.0-32.9, adult: Secondary | ICD-10-CM | POA: Insufficient documentation

## 2014-03-22 DIAGNOSIS — Z6828 Body mass index (BMI) 28.0-28.9, adult: Secondary | ICD-10-CM | POA: Insufficient documentation

## 2014-03-22 NOTE — Progress Notes (Signed)
   Subjective:    Patient ID: Joshua Warner, male    DOB: 1951-04-28, 63 y.o.   MRN: [id number]  HPI Joshua Warner to UC on Sat morning for acute bronchitis.  He says BP was high there. Then went to chiropractor today adn BP was aourn 150/90. He has been asymptomatic. No CP, SOB, etc. Was on nyquail when went to UC but not on those meds now. No decongestants.  Has gained 10 lbs in the last couple of months since he retired.  Quit walking for exercise about 4 weeks ago.   BP Readings from Last 3 Encounters:  03/09/14 156/95  01/17/14 140/88  12/14/13 138/88     Medication List       This list is accurate as of: 03/22/14  4:48 PM.  Always use your most recent med list.               b complex vitamins tablet  Take 1 tablet by mouth daily.     calcium carbonate 600 MG Tabs tablet  Commonly known as:  OS-CAL  Take 600 mg by mouth daily.     clotrimazole-betamethasone cream  Commonly known as:  LOTRISONE  Apply 1 application topically 2 (two) times daily.     Fish Oil 1000 MG Caps  Take by mouth.     GLUCOSAMINE CHONDR 1500 COMPLX Caps  Take by mouth daily.     ibuprofen 800 MG tablet  Commonly known as:  ADVIL,MOTRIN  Take 1 tablet (800 mg total) by mouth every 8 (eight) hours as needed.     ipratropium 0.06 % nasal spray  Commonly known as:  ATROVENT  Place 2 sprays into both nostrils 4 (four) times daily.     loratadine 10 MG tablet  Commonly known as:  CLARITIN  Take 10 mg by mouth daily.     losartan 100 MG tablet  Commonly known as:  COZAAR  Take 1 tablet (100 mg total) by mouth daily.     multivitamin tablet  Take 1 tablet by mouth daily.     naproxen 500 MG tablet  Commonly known as:  NAPROSYN  Take 1 tablet (500 mg total) by mouth daily as needed.     omeprazole 20 MG capsule  Commonly known as:  PRILOSEC  Take 1 capsule (20 mg total) by mouth daily.          Review of Systems     Objective:   Physical Exam  Constitutional: He is oriented to  person, place, and time. He appears well-developed and well-nourished.  HENT:  Head: Normocephalic and atraumatic.  Cardiovascular: Normal rate, regular rhythm and normal heart sounds.   Pulmonary/Chest: Effort normal and breath sounds normal.  Neurological: He is alert and oriented to person, place, and time.  Skin: Skin is warm and dry.  Psychiatric: He has a normal mood and affect. His behavior is normal.          Assessment & Plan:  HTN - well controlled this afternoon. Plan is to check BP at home in Am and PM. He has a cuff at home. F/U in 6 weeks. Make sure get back into walking program.  Work on diet and weight loss.    BMI 30 - discussed importance of exercise and weight loss.

## 2014-03-22 NOTE — Patient Instructions (Signed)
Make sure eating a low salt diet Start checking Blood pressure in the morning and evening.   Bring you cuff in with your at the follow-up.

## 2014-04-26 ENCOUNTER — Encounter: Payer: Self-pay | Admitting: Sports Medicine

## 2014-04-26 ENCOUNTER — Ambulatory Visit (INDEPENDENT_AMBULATORY_CARE_PROVIDER_SITE_OTHER): Payer: BC Managed Care – PPO | Admitting: Sports Medicine

## 2014-04-26 VITALS — BP 127/84 | HR 66 | Ht 71.0 in | Wt 215.0 lb

## 2014-04-26 DIAGNOSIS — M1990 Unspecified osteoarthritis, unspecified site: Secondary | ICD-10-CM

## 2014-04-26 DIAGNOSIS — M19049 Primary osteoarthritis, unspecified hand: Secondary | ICD-10-CM

## 2014-04-26 NOTE — Assessment & Plan Note (Addendum)
Repeat bilateral injections, three-month response due to previous series of injections. Return as needed. He does have some pain in his left metacarpal phalangeal joint as well, we can certainly inject that if he continues to have symptoms next week.

## 2014-04-26 NOTE — Progress Notes (Signed)
  Subjective:    CC: Follow-up  HPI: Bilateral trapeziometacarpal arthritis: 4 months since last injection, desires repeat today.  Baker's cyst: Left-sided, doing well since previous aspiration and injection, symptoms are not bad enough at this point for him to consider a repeat.  Past medical history, Surgical history, Family history not pertinant except as noted below, Social history, Allergies, and medications have been entered into the medical record, reviewed, and no changes needed.   Review of Systems: No fevers, chills, night sweats, weight loss, chest pain, or shortness of breath.   Objective:    General: Well Developed, well nourished, and in no acute distress.  Neuro: Alert and oriented x3, extra-ocular muscles intact, sensation grossly intact.  HEENT: Normocephalic, atraumatic, pupils equal round reactive to light, neck supple, no masses, no lymphadenopathy, thyroid nonpalpable.  Skin: Warm and dry, no rashes. Cardiac: Regular rate and rhythm, no murmurs rubs or gallops, no lower extremity edema.  Respiratory: Clear to auscultation bilaterally. Not using accessory muscles, speaking in full sentences.  Procedure: Real-time Ultrasound Guided Injection of left trapeziometacarpal joint Device: GE Logiq E  Verbal informed consent obtained.  Time-out conducted.  Noted no overlying erythema, induration, or other signs of local infection.  Skin prepped in a sterile fashion.  Local anesthesia: Topical Ethyl chloride.  With sterile technique and under real time ultrasound guidance:  0.5 mL kenalog 40, 0.5 mL lidocaine injected easily. Completed without difficulty  Pain immediately resolved suggesting accurate placement of the medication.  Advised to call if fevers/chills, erythema, induration, drainage, or persistent bleeding.  Images permanently stored and available for review in the ultrasound unit.  Impression: Technically successful ultrasound guided injection.  Procedure:  Real-time Ultrasound Guided Injection of right trapeziometacarpal joint Device: GE Logiq E  Verbal informed consent obtained.  Time-out conducted.  Noted no overlying erythema, induration, or other signs of local infection.  Skin prepped in a sterile fashion.  Local anesthesia: Topical Ethyl chloride.  With sterile technique and under real time ultrasound guidance:  0.5 mL kenalog 40, 0.5 mL lidocaine injected easily. Completed without difficulty  Pain immediately resolved suggesting accurate placement of the medication.  Advised to call if fevers/chills, erythema, induration, drainage, or persistent bleeding.  Images permanently stored and available for review in the ultrasound unit.  Impression: Technically successful ultrasound guided injection.  Impression and Recommendations:

## 2014-07-09 ENCOUNTER — Encounter: Payer: Self-pay | Admitting: Family Medicine

## 2014-07-09 ENCOUNTER — Ambulatory Visit (INDEPENDENT_AMBULATORY_CARE_PROVIDER_SITE_OTHER): Payer: Self-pay | Admitting: Family Medicine

## 2014-07-09 VITALS — BP 124/88 | HR 84 | Ht 71.0 in | Wt 213.0 lb

## 2014-07-09 DIAGNOSIS — R05 Cough: Secondary | ICD-10-CM

## 2014-07-09 DIAGNOSIS — Z0000 Encounter for general adult medical examination without abnormal findings: Secondary | ICD-10-CM

## 2014-07-09 DIAGNOSIS — R059 Cough, unspecified: Secondary | ICD-10-CM

## 2014-07-09 DIAGNOSIS — I10 Essential (primary) hypertension: Secondary | ICD-10-CM

## 2014-07-09 NOTE — Progress Notes (Signed)
   Subjective:    Patient ID: Joshua Warner, male    DOB: 08/16/1950, 64 y.o.   MRN: [id number]  HPI Hypertension- Pt denies chest pain, SOB, dizziness, or heart palpitations.  Taking meds as directed w/o problems.  Denies medication side effects.  Last seen in September. Of time blood pressures have been elevated though it looked great that particular day. Has been exercising. He has lost a few pounds since last here .  He is losartan.    Had bronchitis in August.  Says he is better but still occ cough.  He says notices a wheeze sometime. Has has some post nasal drip. Takes claritin daily.    Review of Systems     Objective:   Physical Exam  Constitutional: He is oriented to person, place, and time. He appears well-developed and well-nourished.  HENT:  Head: Normocephalic and atraumatic.  Cardiovascular: Normal rate, regular rhythm and normal heart sounds.   Pulmonary/Chest: Effort normal and breath sounds normal.  Neurological: He is alert and oriented to person, place, and time.  Skin: Skin is warm and dry.  Psychiatric: He has a normal mood and affect. His behavior is normal.          Assessment & Plan:  HTN - well controlled.  F/U in 6 months. Due for labs. Slip given. He plans to schedule physical in Feb.   Cough - lungs are clear on exam today. It sounds like overall he is much better. Just the occasional cough. If he desires further workup then he will let me know. We can certainly get a chest x-ray and try treatment for reflux if his symptoms persist.

## 2014-07-10 ENCOUNTER — Other Ambulatory Visit: Payer: Self-pay | Admitting: *Deleted

## 2014-07-10 DIAGNOSIS — I10 Essential (primary) hypertension: Secondary | ICD-10-CM

## 2014-07-10 DIAGNOSIS — K219 Gastro-esophageal reflux disease without esophagitis: Secondary | ICD-10-CM

## 2014-07-10 MED ORDER — LOSARTAN POTASSIUM 100 MG PO TABS: 100.0000 mg | ORAL_TABLET | Freq: Every day | ORAL | Status: DC

## 2014-07-10 MED ORDER — OMEPRAZOLE 20 MG PO CPDR: 20.0000 mg | DELAYED_RELEASE_CAPSULE | Freq: Every day | ORAL | Status: DC

## 2014-08-10 LAB — COMPLETE METABOLIC PANEL WITH GFR
ALK PHOS: 68 U/L (ref 39–117)
ALT: 32 U/L (ref 0–53)
AST: 27 U/L (ref 0–37)
Albumin: 4.2 g/dL (ref 3.5–5.2)
BUN: 20 mg/dL (ref 6–23)
CALCIUM: 9.7 mg/dL (ref 8.4–10.5)
CHLORIDE: 103 meq/L (ref 96–112)
CO2: 27 mEq/L (ref 19–32)
CREATININE: 1.02 mg/dL (ref 0.50–1.35)
GFR, Est African American: >89 mL/min
GFR, Est Non African American: 78 mL/min
Glucose, Bld: 87 mg/dL (ref 70–99)
Potassium: 4.9 mEq/L (ref 3.5–5.3)
Sodium: 138 mEq/L (ref 135–145)
Total Bilirubin: 0.7 mg/dL (ref 0.2–1.2)
Total Protein: 6.7 g/dL (ref 6.0–8.3)

## 2014-08-10 LAB — LIPID PANEL
CHOL/HDL RATIO: 3.8 ratio
Cholesterol: 173 mg/dL (ref 0–200)
HDL: 45 mg/dL (ref >39)
LDL Cholesterol: 112 mg/dL — ABNORMAL HIGH (ref 0–99)
TRIGLYCERIDES: 81 mg/dL (ref <150)
VLDL: 16 mg/dL (ref 0–40)

## 2014-08-10 LAB — PSA: PSA: 0.67 ng/mL (ref <=4.00)

## 2014-08-12 ENCOUNTER — Encounter: Payer: Self-pay | Admitting: Family Medicine

## 2014-08-12 DIAGNOSIS — E785 Hyperlipidemia, unspecified: Secondary | ICD-10-CM | POA: Insufficient documentation

## 2014-08-16 ENCOUNTER — Encounter: Payer: Self-pay | Admitting: Family Medicine

## 2014-08-16 ENCOUNTER — Ambulatory Visit (INDEPENDENT_AMBULATORY_CARE_PROVIDER_SITE_OTHER): Payer: BLUE CROSS/BLUE SHIELD | Admitting: Family Medicine

## 2014-08-16 VITALS — BP 128/82 | HR 72 | Ht 71.0 in | Wt 213.0 lb

## 2014-08-16 DIAGNOSIS — Z0000 Encounter for general adult medical examination without abnormal findings: Secondary | ICD-10-CM

## 2014-08-16 NOTE — Patient Instructions (Signed)
Keep up a regular exercise program and make sure you are eating a healthy diet Try to eat 4 servings of dairy a day, or if you are lactose intolerant take a calcium with vitamin D daily.  Your vaccines are up to date.   

## 2014-08-16 NOTE — Progress Notes (Signed)
Subjective:    Patient ID: Joshua Warner, male    DOB: [birth year], 64 y.o.   MRN: [id number]  HPI Here for CPE  Has been exercising at [employer]. Says has ben working out for one month.  Given lab results and reviewed with him today. Vision exam last spring. Still working part time though retired last spring.    Review of Systems Comprehensive ROS is neg.    BP 128/82 mmHg  Pulse 72  Ht 5\' 11"  (1.803 m)  Wt 213 lb (96.616 kg)  BMI 29.72 kg/m2  SpO2 96%    Allergies  Allergen Reactions  . Celecoxib     REACTION: rash  . Sulfonamide Derivatives     REACTION: hives    Past Medical History  Diagnosis Date  . Hypertension since 2007  . Kidney stones 2009    Past Surgical History  Procedure Laterality Date  . Tonsillectomy  1957  . Knee arthroscopy  12/31/2010    Right knee     History   Social History  . Marital Status: Married    Spouse Name: N/A  . Number of Children: 2  . Years of Education: N/A   Occupational History  . Retired [employer]chant [employer] officer [employer]   Social History Main Topics  . Smoking status: Never Smoker   . Smokeless tobacco: Never Used  . Alcohol Use: 2.0 oz/week    4 drink(s) per week  . Drug Use: No  . Sexual Activity:    Partners: Female     Comment: 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   Other Topics Concern  . Not on file   Social History Narrative   No regular exercise. Retired from [employer]    Family History  Problem Relation Age of Onset  . Hypertension Mother   . Prostate cancer Maternal Uncle     Deceased  . Depression Maternal Uncle   . Cancer Paternal Grandfather   . Cancer Paternal Grandmother   . Heart disease Father     Outpatient Encounter Prescriptions as of 08/16/2014  Medication Sig  . b complex vitamins tablet Take 1 tablet by mouth daily.  . calcium carbonate (OS-CAL) 600 MG TABS Take 600 mg by mouth daily.  .  Glucosamine-Chondroit-Vit C-Mn (GLUCOSAMINE CHONDR 1500 COMPLX) CAPS Take by mouth daily.    Marland Kitchen ibuprofen (ADVIL,MOTRIN) 800 MG tablet Take 1 tablet (800 mg total) by mouth every 8 (eight) hours as needed.  . loratadine (CLARITIN) 10 MG tablet Take 10 mg by mouth daily.  Marland Kitchen losartan (COZAAR) 100 MG tablet Take 1 tablet (100 mg total) by mouth daily.  . Multiple Vitamin (MULTIVITAMIN) tablet Take 1 tablet by mouth daily.    . Omega-3 Fatty Acids (FISH OIL) 1000 MG CAPS Take by mouth.  Marland Kitchen omeprazole (PRILOSEC) 20 MG capsule Take 1 capsule (20 mg total) by mouth daily.  . [DISCONTINUED] naproxen (NAPROSYN) 500 MG tablet Take 1 tablet (500 mg total) by mouth daily as needed.          Objective:   Physical Exam  Constitutional: He is oriented to person, place, and time. He appears well-developed and well-nourished.  HENT:  Head: Normocephalic and atraumatic.  Cardiovascular: Normal rate, regular rhythm and normal heart sounds.   Pulmonary/Chest: Effort normal and breath sounds normal.  Neurological: He is alert and oriented to person, place, and time.  Skin: Skin is warm and dry.  Psychiatric: He has a normal mood and affect. His behavior is normal.          Assessment & Plan:  CPE Keep up a regular exercise program and make sure you are eating a healthy diet Try to eat 4 servings of dairy a day, or if you are lactose intolerant take a calcium with vitamin D daily.  Your vaccines are up to date.

## 2014-08-30 ENCOUNTER — Encounter: Payer: Self-pay | Admitting: Family Medicine

## 2014-08-30 ENCOUNTER — Encounter: Payer: Self-pay | Admitting: Sports Medicine

## 2014-08-30 ENCOUNTER — Ambulatory Visit (INDEPENDENT_AMBULATORY_CARE_PROVIDER_SITE_OTHER): Payer: BLUE CROSS/BLUE SHIELD | Admitting: Sports Medicine

## 2014-08-30 ENCOUNTER — Ambulatory Visit (INDEPENDENT_AMBULATORY_CARE_PROVIDER_SITE_OTHER): Payer: BLUE CROSS/BLUE SHIELD | Admitting: Family Medicine

## 2014-08-30 VITALS — BP 121/85 | HR 66 | Ht 70.0 in | Wt 213.0 lb

## 2014-08-30 VITALS — BP 121/85 | HR 66 | Wt 213.0 lb

## 2014-08-30 DIAGNOSIS — M19049 Primary osteoarthritis, unspecified hand: Secondary | ICD-10-CM

## 2014-08-30 DIAGNOSIS — L819 Disorder of pigmentation, unspecified: Secondary | ICD-10-CM | POA: Diagnosis not present

## 2014-08-30 DIAGNOSIS — M1990 Unspecified osteoarthritis, unspecified site: Secondary | ICD-10-CM | POA: Diagnosis not present

## 2014-08-30 DIAGNOSIS — L814 Other melanin hyperpigmentation: Secondary | ICD-10-CM

## 2014-08-30 NOTE — Progress Notes (Signed)
  Subjective:    CC: Follow-up  HPI: Bilateral trapeziometacarpal arthritis: 5 months since last injection, desires repeat today.  Past medical history, Surgical history, Family history not pertinant except as noted below, Social history, Allergies, and medications have been entered into the medical record, reviewed, and no changes needed.   Review of Systems: No fevers, chills, night sweats, weight loss, chest pain, or shortness of breath.   Objective:    General: Well Developed, well nourished, and in no acute distress.  Neuro: Alert and oriented x3, extra-ocular muscles intact, sensation grossly intact.  HEENT: Normocephalic, atraumatic, pupils equal round reactive to light, neck supple, no masses, no lymphadenopathy, thyroid nonpalpable.  Skin: Warm and dry, no rashes. Cardiac: Regular rate and rhythm, no murmurs rubs or gallops, no lower extremity edema.  Respiratory: Clear to auscultation bilaterally. Not using accessory muscles, speaking in full sentences.  Procedure: Real-time Ultrasound Guided Injection of left trapeziometacarpal joint Device: GE Logiq E  Verbal informed consent obtained.  Time-out conducted.  Noted no overlying erythema, induration, or other signs of local infection.  Skin prepped in a sterile fashion.  Local anesthesia: Topical Ethyl chloride.  With sterile technique and under real time ultrasound guidance:  0.5 mL kenalog 40, 0.5 mL lidocaine injected easily. Completed without difficulty  Pain immediately resolved suggesting accurate placement of the medication.  Advised to call if fevers/chills, erythema, induration, drainage, or persistent bleeding.  Images permanently stored and available for review in the ultrasound unit.  Impression: Technically successful ultrasound guided injection.  Procedure: Real-time Ultrasound Guided Injection of right trapeziometacarpal joint Device: GE Logiq E  Verbal informed consent obtained.  Time-out conducted.    Noted no overlying erythema, induration, or other signs of local infection.  Skin prepped in a sterile fashion.  Local anesthesia: Topical Ethyl chloride.  With sterile technique and under real time ultrasound guidance:  0.5 mL kenalog 40, 0.5 mL lidocaine injected easily. Completed without difficulty  Pain immediately resolved suggesting accurate placement of the medication.  Advised to call if fevers/chills, erythema, induration, drainage, or persistent bleeding.  Images permanently stored and available for review in the ultrasound unit.  Impression: Technically successful ultrasound guided injection.  Impression and Recommendations:

## 2014-08-30 NOTE — Assessment & Plan Note (Signed)
Five-month response to prior injection, repeat injection today. Return as needed

## 2014-08-30 NOTE — Progress Notes (Signed)
   Subjective:    Patient ID: Joshua Warner, male    DOB: 02-11-1951, 65 y.o.   MRN: [id number]  HPI    Review of Systems     Objective:   Physical Exam        Assessment & Plan:  Here for cryotherapy for swollen to go on his forehead. Patient tolerated cryotherapy well.  Cryotherapy Procedure Note  Pre-operative Diagnosis: solar lentigo  Post-operative Diagnosis: same  Locations: forehead, temples  Indications:   Anesthesia: not required    Procedure Details  Patient informed of risks (permanent scarring, infection, light or dark discoloration, bleeding, infection, weakness, numbness and recurrence of the lesion) and benefits of the procedure and verbal informed consent obtained.  The areas are treated with liquid nitrogen therapy, frozen until ice ball extended 1 mm beyond lesion, allowed to thaw, and treated again. The patient tolerated procedure well.  The patient was instructed on post-op care, warned that there may be blister formation, redness and pain. Recommend OTC analgesia as needed for pain.  Condition: Stable  Complications: none.  Plan: 1. Instructed to keep the area dry and covered for 24-48h and clean thereafter. 2. Warning signs of infection were reviewed.   3. Recommended that the patient use OTC acetaminophen as needed for pain.  4. Return PRN.

## 2014-10-25 ENCOUNTER — Encounter: Payer: Self-pay | Admitting: Sports Medicine

## 2014-10-25 ENCOUNTER — Ambulatory Visit (INDEPENDENT_AMBULATORY_CARE_PROVIDER_SITE_OTHER): Payer: BLUE CROSS/BLUE SHIELD | Admitting: Sports Medicine

## 2014-10-25 VITALS — BP 119/82 | HR 56 | Ht 71.0 in | Wt 209.0 lb

## 2014-10-25 DIAGNOSIS — M778 Other enthesopathies, not elsewhere classified: Secondary | ICD-10-CM

## 2014-10-25 DIAGNOSIS — M7582 Other shoulder lesions, left shoulder: Secondary | ICD-10-CM | POA: Diagnosis not present

## 2014-10-25 NOTE — Progress Notes (Signed)
   Subjective:    I'm seeing this patient as a consultation for:  Dr. Beatrice Lecher  CC: Left shoulder pain  HPI: 2 weeks ago this pleasant 64 year old male was lifting heavy boxes at work, the next day he developed severe pain on the anterior aspect of his left shoulder with difficulty reaching behind his back. Pain is persistent, without radiation. He has tried some oral anti-inflammatories without much improvement.  Past medical history, Surgical history, Family history not pertinant except as noted below, Social history, Allergies, and medications have been entered into the medical record, reviewed, and no changes needed.   Review of Systems: No headache, visual changes, nausea, vomiting, diarrhea, constipation, dizziness, abdominal pain, skin rash, fevers, chills, night sweats, weight loss, swollen lymph nodes, body aches, joint swelling, muscle aches, chest pain, shortness of breath, mood changes, visual or auditory hallucinations.   Objective:   General: Well Developed, well nourished, and in no acute distress.  Neuro/Psych: Alert and oriented x3, extra-ocular muscles intact, able to move all 4 extremities, sensation grossly intact. Skin: Warm and dry, no rashes noted.  Respiratory: Not using accessory muscles, speaking in full sentences, trachea midline.  Cardiovascular: Pulses palpable, no extremity edema. Abdomen: Does not appear distended. Left Shoulder: Inspection reveals no abnormalities, atrophy or asymmetry. Palpation is normal with no tenderness over AC joint or bicipital groove. ROM is full in all planes. Rotator cuff strength weak to internal rotation with a positive lift off sign. No signs of impingement with negative Neer and Hawkin's tests, empty can. Speeds and Yergason's tests normal. No labral pathology noted with negative Obrien's, negative crank, negative clunk, and good stability. Normal scapular function observed. No painful arc and no drop arm sign. No  apprehension sign  Procedure: Diagnostic Ultrasound of left shoulder Device: GE Logiq E  Findings: Visualized the supraspinatus, infraspinatus, teres minor, subscapularis, long head of the biceps tendon and the acromioclavicular joint and long and short axes, noted calcific tendinitis in the distal supraspinatus insertion, no tears. There was a bit of fluid in the biceps tendon sheath. Images permanently stored and available for review in the ultrasound unit.  Impression:  Calcific tendinitis of the rotator cuff  Procedure: Real-time Ultrasound Guided Injection of left subcoracoid space Device: GE Logiq E  Verbal informed consent obtained.  Time-out conducted.  Noted no overlying erythema, induration, or other signs of local infection.  Skin prepped in a sterile fashion.  Local anesthesia: Topical Ethyl chloride.  With sterile technique and under real time ultrasound guidance:  25-gauge needle advanced into the subcoracoid space, 1 mL kenalog 40, 3 mL lidocaine injected easily. Completed without difficulty  Pain immediately resolved suggesting accurate placement of the medication.  Advised to call if fevers/chills, erythema, induration, drainage, or persistent bleeding.  Images permanently stored and available for review in the ultrasound unit.  Impression: Technically successful ultrasound guided injection.  Impression and Recommendations:   This case required medical decision making of moderate complexity.

## 2014-10-25 NOTE — Assessment & Plan Note (Signed)
With reproduction of pain on resisted internal rotation. Also noted some calcific tendinopathy in the distal supraspinatus. Nothing was torn. Injection into the subcoracoid space. Home rehabilitation exercises, over-the-counter NSAIDs. Return in one month.

## 2014-11-04 ENCOUNTER — Encounter: Payer: Self-pay | Admitting: *Deleted

## 2014-11-04 ENCOUNTER — Emergency Department (INDEPENDENT_AMBULATORY_CARE_PROVIDER_SITE_OTHER)
Admission: EM | Admit: 2014-11-04 | Discharge: 2014-11-04 | Disposition: A | Discharge status: Home / Self Care | Payer: BLUE CROSS/BLUE SHIELD | Source: Home / Self Care | Attending: Family Medicine | Admitting: Family Medicine

## 2014-11-04 DIAGNOSIS — J309 Allergic rhinitis, unspecified: Secondary | ICD-10-CM

## 2014-11-04 DIAGNOSIS — B9789 Other viral agents as the cause of diseases classified elsewhere: Principal | ICD-10-CM

## 2014-11-04 DIAGNOSIS — J069 Acute upper respiratory infection, unspecified: Secondary | ICD-10-CM | POA: Diagnosis not present

## 2014-11-04 DIAGNOSIS — J3089 Other allergic rhinitis: Secondary | ICD-10-CM

## 2014-11-04 MED ORDER — BENZONATATE 200 MG PO CAPS: 200.0000 mg | ORAL_CAPSULE | Freq: Every day | ORAL | Status: DC

## 2014-11-04 MED ORDER — CLARITHROMYCIN 250 MG PO TABS: ORAL_TABLET | ORAL | Status: DC

## 2014-11-04 MED ORDER — PREDNISONE 20 MG PO TABS: 20.0000 mg | ORAL_TABLET | Freq: Two times a day (BID) | ORAL | Status: DC

## 2014-11-04 NOTE — Discharge Instructions (Signed)
Take plain guaifenesin (1200mg  extended release tabs such as Mucinex) twice daily, with plenty of water, for cough and congestion.  Get adequate rest.   May use Afrin nasal spray (or generic oxymetazoline) twice daily for about 5 days.  Also recommend using saline nasal spray several times daily and saline nasal irrigation (AYR is a common brand).   Try warm salt water gargles for sore throat.  Stop all antihistamines for now, and other non-prescription cough/cold preparations. Begin Biaxin if not improving about one week or if persistent fever develops   Follow-up with family doctor if not improving about10 days.

## 2014-11-04 NOTE — ED Notes (Signed)
Pt c/o 4 days of nasal congestion, sneezing, watery eyes. Cough x yesterday. Afebrile. Taken Nyquil OTC.

## 2014-11-04 NOTE — ED Provider Notes (Signed)
CSN: [id number]     Arrival date & time 11/04/14  1108 History   First MD Initiated Contact with Patient 11/04/14 1224     Chief Complaint  Patient presents with  . Nasal Congestion  . Cough      HPI Comments: Patient complains of five day history of typical cold-like symptoms beginning with a mild sore throat, sinus congestion and fatigue, developing a cough yesterday.   The history is provided by the patient.    Past Medical History  Diagnosis Date  . Hypertension since 2007  . Kidney stones 2009   Past Surgical History  Procedure Laterality Date  . Tonsillectomy  1957  . Knee arthroscopy  12/31/2010    Right knee    Family History  Problem Relation Age of Onset  . Hypertension Mother   . Prostate cancer Maternal Uncle     Deceased  . Depression Maternal Uncle   . Cancer Paternal Grandfather   . Cancer Paternal Grandmother   . Heart disease Father    History  Substance Use Topics  . Smoking status: Never Smoker   . Smokeless tobacco: Never Used  . Alcohol Use: 2.0 oz/week    4 drink(s) per week    Review of Systems + sore throat + cough No pleuritic pain ? wheezing + nasal congestion + post-nasal drainage No sinus pain/pressure No itchy/red eyes ? earache No hemoptysis No SOB No fever/chills No nausea No vomiting No abdominal pain No diarrhea No urinary symptoms No skin rash + fatigue No myalgias No headache Used OTC meds without relief  Allergies  Celecoxib and Sulfonamide derivatives  Home Medications   Prior to Admission medications   Medication Sig Start Date End Date Taking? Authorizing Provider  b complex vitamins tablet Take 1 tablet by mouth daily.    Historical Provider, MD  benzonatate (TESSALON) 200 MG capsule Take 1 capsule (200 mg total) by mouth at bedtime. Take as needed for cough 11/04/14   Kandra Nicolas, MD  calcium carbonate (OS-CAL) 600 MG TABS Take 600 mg by mouth daily.    Historical Provider, MD  clarithromycin (BIAXIN)  250 MG tablet Take one tab by mouth every 12 hours.  Take with food 11/04/14   Kandra Nicolas, MD  Glucosamine-Chondroit-Vit C-Mn (GLUCOSAMINE CHONDR 1500 COMPLX) CAPS Take by mouth daily.      Historical Provider, MD  ibuprofen (ADVIL,MOTRIN) 800 MG tablet Take 1 tablet (800 mg total) by mouth every 8 (eight) hours as needed. 08/20/13   Silverio Decamp, MD  loratadine (CLARITIN) 10 MG tablet Take 10 mg by mouth daily.    Historical Provider, MD  losartan (COZAAR) 100 MG tablet Take 1 tablet (100 mg total) by mouth daily. 07/10/14   Hali Marry, MD  Multiple Vitamin (MULTIVITAMIN) tablet Take 1 tablet by mouth daily.      Historical Provider, MD  Omega-3 Fatty Acids (FISH OIL) 1000 MG CAPS Take by mouth.    Historical Provider, MD  omeprazole (PRILOSEC) 20 MG capsule Take 1 capsule (20 mg total) by mouth daily. 07/10/14   Hali Marry, MD  predniSONE (DELTASONE) 20 MG tablet Take 1 tablet (20 mg total) by mouth 2 (two) times daily. Take with food. 11/04/14   Kandra Nicolas, MD   BP 129/83 mmHg  Pulse 77  Temp(Src) 98 F (36.7 C) (Oral)  Resp 14  Wt 206 lb (93.441 kg)  SpO2 96% Physical Exam Nursing notes and Vital Signs reviewed. Appearance:  Patient  appears stated age, and in no acute distress Eyes:  Pupils are equal, round, and reactive to light and accomodation.  Extraocular movement is intact.  Conjunctivae are not inflamed  Ears:  Canals normal.  Tympanic membranes normal.  Nose:  Congested turbinates.  No sinus tenderness.   Pharynx:  Normal Neck:  Supple.   Tender enlarged posterior nodes are palpated bilaterally  Lungs:  Clear to auscultation.  Breath sounds are equal.  Heart:  Regular rate and rhythm without murmurs, rubs, or gallops.  Abdomen:  Nontender without masses or hepatosplenomegaly.  Bowel sounds are present.  No CVA or flank tenderness.  Extremities:  No edema.  No calf tenderness Skin:  No rash present.   ED Course  Procedures  none  MDM   1.  Viral URI with cough   2. Perennial allergic rhinitis    There is no evidence of bacterial infection today.  Begin prednisone burst. Prescription written for Benzonatate (Tessalon) to take at bedtime for night-time cough.  Take plain guaifenesin (1200mg  extended release tabs such as Mucinex) twice daily, with plenty of water, for cough and congestion.  Get adequate rest.   May use Afrin nasal spray (or generic oxymetazoline) twice daily for about 5 days.  Also recommend using saline nasal spray several times daily and saline nasal irrigation (AYR is a common brand).   Try warm salt water gargles for sore throat.  Stop all antihistamines for now, and other non-prescription cough/cold preparations. Begin Biaxin if not improving about one week or if persistent fever develops (Given a prescription to hold, with an expiration date)  Follow-up with family doctor if not improving about10 days.    Kandra Nicolas, MD 11/15/14 1101

## 2014-11-22 ENCOUNTER — Encounter: Payer: Self-pay | Admitting: Sports Medicine

## 2014-11-22 ENCOUNTER — Ambulatory Visit (INDEPENDENT_AMBULATORY_CARE_PROVIDER_SITE_OTHER): Payer: BLUE CROSS/BLUE SHIELD | Admitting: Sports Medicine

## 2014-11-22 VITALS — BP 124/84 | HR 69 | Wt 210.0 lb

## 2014-11-22 DIAGNOSIS — M778 Other enthesopathies, not elsewhere classified: Secondary | ICD-10-CM

## 2014-11-22 DIAGNOSIS — M7582 Other shoulder lesions, left shoulder: Secondary | ICD-10-CM | POA: Diagnosis not present

## 2014-11-22 NOTE — Progress Notes (Signed)
  Subjective:    CC: Follow-up  HPI: Subscapularis tendinitis: Resolved with subcoracoid injection and physical therapy. Happy with results so far.  Past medical history, Surgical history, Family history not pertinant except as noted below, Social history, Allergies, and medications have been entered into the medical record, reviewed, and no changes needed.   Review of Systems: No fevers, chills, night sweats, weight loss, chest pain, or shortness of breath.   Objective:    General: Well Developed, well nourished, and in no acute distress.  Neuro: Alert and oriented x3, extra-ocular muscles intact, sensation grossly intact.  HEENT: Normocephalic, atraumatic, pupils equal round reactive to light, neck supple, no masses, no lymphadenopathy, thyroid nonpalpable.  Skin: Warm and dry, no rashes. Cardiac: Regular rate and rhythm, no murmurs rubs or gallops, no lower extremity edema.  Respiratory: Clear to auscultation bilaterally. Not using accessory muscles, speaking in full sentences. Left Shoulder: Inspection reveals no abnormalities, atrophy or asymmetry. Palpation is normal with no tenderness over AC joint or bicipital groove. ROM is full in all planes. Rotator cuff strength normal throughout. No signs of impingement with negative Neer and Hawkin's tests, empty can. Speeds and Yergason's tests normal. No labral pathology noted with negative Obrien's, negative crank, negative clunk, and good stability. Normal scapular function observed. No painful arc and no drop arm sign. No apprehension sign  Impression and Recommendations:

## 2014-11-22 NOTE — Assessment & Plan Note (Signed)
Joshua Warner is doing extremely well, we did a subcoracoid space injection around the subscapularis tendon at the last visit and he did his rehabilitation exercises and returns pain-free. He is still doing a good amount of overhead shoulder press in the gym, I have cautioned against this for now. Return as needed.

## 2014-12-20 ENCOUNTER — Ambulatory Visit (INDEPENDENT_AMBULATORY_CARE_PROVIDER_SITE_OTHER): Payer: BLUE CROSS/BLUE SHIELD | Admitting: Sports Medicine

## 2014-12-20 ENCOUNTER — Encounter: Payer: Self-pay | Admitting: Sports Medicine

## 2014-12-20 VITALS — BP 122/80 | HR 73 | Ht 71.0 in | Wt 211.0 lb

## 2014-12-20 DIAGNOSIS — M1990 Unspecified osteoarthritis, unspecified site: Secondary | ICD-10-CM

## 2014-12-20 DIAGNOSIS — M19049 Primary osteoarthritis, unspecified hand: Secondary | ICD-10-CM

## 2014-12-20 NOTE — Progress Notes (Signed)
  Subjective:    CC: Bilateral thumb pain  HPI: Trapeziometacarpal osteoarthritis: Previous injection was 4 months ago, repeat bilateral carpometacarpal injections as above, pain is moderate, persistent, localized without radiation.  Past medical history, Surgical history, Family history not pertinant except as noted below, Social history, Allergies, and medications have been entered into the medical record, reviewed, and no changes needed.   Review of Systems: No fevers, chills, night sweats, weight loss, chest pain, or shortness of breath.   Objective:    General: Well Developed, well nourished, and in no acute distress.  Neuro: Alert and oriented x3, extra-ocular muscles intact, sensation grossly intact.  HEENT: Normocephalic, atraumatic, pupils equal round reactive to light, neck supple, no masses, no lymphadenopathy, thyroid nonpalpable.  Skin: Warm and dry, no rashes. Cardiac: Regular rate and rhythm, no murmurs rubs or gallops, no lower extremity edema.  Respiratory: Clear to auscultation bilaterally. Not using accessory muscles, speaking in full sentences.  Procedure: Real-time Ultrasound Guided Injection of left trapeziometacarpal joint Device: GE Logiq E  Verbal informed consent obtained.  Time-out conducted.  Noted no overlying erythema, induration, or other signs of local infection.  Skin prepped in a sterile fashion.  Local anesthesia: Topical Ethyl chloride.  With sterile technique and under real time ultrasound guidance:  0.5 mL kenalog 40, 0.5 mL lidocaine injected easily. Completed without difficulty  Pain immediately resolved suggesting accurate placement of the medication.  Advised to call if fevers/chills, erythema, induration, drainage, or persistent bleeding.  Images permanently stored and available for review in the ultrasound unit.  Impression: Technically successful ultrasound guided injection.  Procedure: Real-time Ultrasound Guided Injection of right  trapeziometacarpal joint Device: GE Logiq E  Verbal informed consent obtained.  Time-out conducted.  Noted no overlying erythema, induration, or other signs of local infection.  Skin prepped in a sterile fashion.  Local anesthesia: Topical Ethyl chloride.  With sterile technique and under real time ultrasound guidance:  0.5 mL kenalog 40, 0.5 mL lidocaine injected easily. Completed without difficulty  Pain immediately resolved suggesting accurate placement of the medication.  Advised to call if fevers/chills, erythema, induration, drainage, or persistent bleeding.  Images permanently stored and available for review in the ultrasound unit.  Impression: Technically successful ultrasound guided injection.  Impression and Recommendations:

## 2014-12-20 NOTE — Assessment & Plan Note (Signed)
4 months since the last injection. Repeat bilateral trapeziometacarpal joint injection as above. Return as needed.

## 2015-03-10 ENCOUNTER — Encounter: Payer: Self-pay | Admitting: Sports Medicine

## 2015-03-10 ENCOUNTER — Ambulatory Visit (INDEPENDENT_AMBULATORY_CARE_PROVIDER_SITE_OTHER): Payer: BLUE CROSS/BLUE SHIELD | Admitting: Sports Medicine

## 2015-03-10 DIAGNOSIS — M19049 Primary osteoarthritis, unspecified hand: Secondary | ICD-10-CM

## 2015-03-10 DIAGNOSIS — M1990 Unspecified osteoarthritis, unspecified site: Secondary | ICD-10-CM

## 2015-03-10 MED ORDER — ETODOLAC ER 500 MG PO TB24: 500.0000 mg | ORAL_TABLET | Freq: Every day | ORAL | Status: DC

## 2015-03-10 NOTE — Progress Notes (Signed)
  Subjective:    CC: Hand pain  HPI: This is a pleasant 64 year old male, he has bilateral trapeziometacarpal osteoarthritis but does well with occasional injections, previous injection was 3 months ago, left side is okay, right side still has some pain, moderate, persistent without radiation. No trauma.  Past medical history, Surgical history, Family history not pertinant except as noted below, Social history, Allergies, and medications have been entered into the medical record, reviewed, and no changes needed.   Review of Systems: No fevers, chills, night sweats, weight loss, chest pain, or shortness of breath.   Objective:    General: Well Developed, well nourished, and in no acute distress.  Neuro: Alert and oriented x3, extra-ocular muscles intact, sensation grossly intact.  HEENT: Normocephalic, atraumatic, pupils equal round reactive to light, neck supple, no masses, no lymphadenopathy, thyroid nonpalpable.  Skin: Warm and dry, no rashes. Cardiac: Regular rate and rhythm, no murmurs rubs or gallops, no lower extremity edema.  Respiratory: Clear to auscultation bilaterally. Not using accessory muscles, speaking in full sentences.  Procedure: Real-time Ultrasound Guided Injection of right trapeziometacarpal joint Device: GE Logiq E  Verbal informed consent obtained.  Time-out conducted.  Noted no overlying erythema, induration, or other signs of local infection.  Skin prepped in a sterile fashion.  Local anesthesia: Topical Ethyl chloride.  With sterile technique and under real time ultrasound guidance:  25-gauge needle advanced into the joint, 0.5 mL kenalog 40, 0.5 mL lidocaine injected easily. Completed without difficulty  Pain immediately resolved suggesting accurate placement of the medication.  Advised to call if fevers/chills, erythema, induration, drainage, or persistent bleeding.  Images permanently stored and available for review in the ultrasound unit.  Impression:  Technically successful ultrasound guided injection.  Impression and Recommendations:

## 2015-03-10 NOTE — Assessment & Plan Note (Signed)
Recurrence of trapeziometacarpal pain on the right side, left side is doing okay, both of 3 months post previous injection. We are also going to switch to [place].

## 2015-03-28 ENCOUNTER — Encounter: Payer: Self-pay | Admitting: Family Medicine

## 2015-03-28 ENCOUNTER — Ambulatory Visit (INDEPENDENT_AMBULATORY_CARE_PROVIDER_SITE_OTHER): Payer: BLUE CROSS/BLUE SHIELD | Admitting: Family Medicine

## 2015-03-28 VITALS — BP 128/78 | HR 70 | Temp 97.8°F | Wt 209.0 lb

## 2015-03-28 DIAGNOSIS — R911 Solitary pulmonary nodule: Secondary | ICD-10-CM

## 2015-03-28 DIAGNOSIS — I10 Essential (primary) hypertension: Secondary | ICD-10-CM | POA: Diagnosis not present

## 2015-03-28 DIAGNOSIS — M1990 Unspecified osteoarthritis, unspecified site: Secondary | ICD-10-CM

## 2015-03-28 DIAGNOSIS — Z6829 Body mass index (BMI) 29.0-29.9, adult: Secondary | ICD-10-CM

## 2015-03-28 DIAGNOSIS — Z114 Encounter for screening for human immunodeficiency virus [HIV]: Secondary | ICD-10-CM

## 2015-03-28 DIAGNOSIS — K219 Gastro-esophageal reflux disease without esophagitis: Secondary | ICD-10-CM | POA: Diagnosis not present

## 2015-03-28 DIAGNOSIS — Z23 Encounter for immunization: Secondary | ICD-10-CM

## 2015-03-28 DIAGNOSIS — M19049 Primary osteoarthritis, unspecified hand: Secondary | ICD-10-CM

## 2015-03-28 DIAGNOSIS — Z1159 Encounter for screening for other viral diseases: Secondary | ICD-10-CM

## 2015-03-28 MED ORDER — ETODOLAC ER 500 MG PO TB24: 500.0000 mg | ORAL_TABLET | Freq: Every day | ORAL | Status: DC

## 2015-03-28 MED ORDER — OMEPRAZOLE 20 MG PO CPDR: 20.0000 mg | DELAYED_RELEASE_CAPSULE | Freq: Every day | ORAL | Status: DC

## 2015-03-28 MED ORDER — LOSARTAN POTASSIUM 100 MG PO TABS
100.0000 mg | ORAL_TABLET | Freq: Every day | ORAL | Status: DC
Start: 2015-03-28 — End: 2016-01-01

## 2015-03-28 NOTE — Progress Notes (Signed)
   Subjective:    Patient ID: Joshua Warner, male    DOB: 23-Oct-1950, 64 y.o.   MRN: [id number]  HPI Hypertension- Pt denies chest pain, SOB, dizziness, or heart palpitations.  Taking meds as directed w/o problems.  Denies medication side effects. Has been walking for exercise. Has lost about 3 lbs.      Review of Systems     Objective:   Physical Exam  Constitutional: He is oriented to person, place, and time. He appears well-developed and well-nourished.  HENT:  Head: Normocephalic and atraumatic.  Cardiovascular: Normal rate, regular rhythm and normal heart sounds.   Pulmonary/Chest: Effort normal and breath sounds normal.  Neurological: He is alert and oriented to person, place, and time.  Skin: Skin is warm and dry.  Psychiatric: He has a normal mood and affect. His behavior is normal.          Assessment & Plan:  HTN - well controlled. F/U in 6 months.  Due for BMP.   Keep up the good work on diet and exercise. Congratulated him on the 3 pound weight loss.   overweight/BMI 29-Keep up the good work on diet and exercise. Congratulated him on the 3 pound weight loss.   discussed need for hepatitis C and HIV screening. Patient is okay with this. Added to his labs.  Flu shot given.

## 2015-04-05 LAB — BASIC METABOLIC PANEL WITH GFR
BUN: 18 mg/dL (ref 7–25)
CHLORIDE: 105 mmol/L (ref 98–110)
CO2: 22 mmol/L (ref 20–31)
Calcium: 9 mg/dL (ref 8.6–10.3)
Creat: 1.02 mg/dL (ref 0.70–1.25)
GFR, Est Non African American: 78 mL/min (ref >=60)
GLUCOSE: 106 mg/dL — AB (ref 65–99)
POTASSIUM: 4.2 mmol/L (ref 3.5–5.3)
Sodium: 138 mmol/L (ref 135–146)

## 2015-04-05 LAB — HEPATITIS C ANTIBODY: HCV AB: NEGATIVE

## 2015-04-05 LAB — HIV ANTIBODY (ROUTINE TESTING W REFLEX): HIV 1&2 Ab, 4th Generation: NONREACTIVE

## 2015-07-11 ENCOUNTER — Ambulatory Visit (INDEPENDENT_AMBULATORY_CARE_PROVIDER_SITE_OTHER): Payer: BLUE CROSS/BLUE SHIELD | Admitting: Sports Medicine

## 2015-07-11 ENCOUNTER — Encounter: Payer: Self-pay | Admitting: Sports Medicine

## 2015-07-11 VITALS — BP 121/82 | HR 77 | Temp 98.5°F | Resp 16 | Wt 221.9 lb

## 2015-07-11 DIAGNOSIS — M1990 Unspecified osteoarthritis, unspecified site: Secondary | ICD-10-CM

## 2015-07-11 DIAGNOSIS — M19049 Primary osteoarthritis, unspecified hand: Secondary | ICD-10-CM

## 2015-07-11 NOTE — Assessment & Plan Note (Signed)
3.5 months post previous trapeziometacarpal joint injection, repeating today. Right side.

## 2015-07-11 NOTE — Progress Notes (Signed)
  Subjective:    CC:  Thumb pain  HPI: Joshua Warner returns, he has known trapeziometacarpal osteoarthritis, previous injection provided almost 4 months of response, he is now having recurrence of pain, moderate, persistent without radiation, desires repeat interventional treatment today.  Past medical history, Surgical history, Family history not pertinant except as noted below, Social history, Allergies, and medications have been entered into the medical record, reviewed, and no changes needed.   Review of Systems: No fevers, chills, night sweats, weight loss, chest pain, or shortness of breath.   Objective:    General: Well Developed, well nourished, and in no acute distress.  Neuro: Alert and oriented x3, extra-ocular muscles intact, sensation grossly intact.  HEENT: Normocephalic, atraumatic, pupils equal round reactive to light, neck supple, no masses, no lymphadenopathy, thyroid nonpalpable.  Skin: Warm and dry, no rashes. Cardiac: Regular rate and rhythm, no murmurs rubs or gallops, no lower extremity edema.  Respiratory: Clear to auscultation bilaterally. Not using accessory muscles, speaking in full sentences.  Procedure: Real-time Ultrasound Guided Injection of Right trapeziometacarpal joint Device: GE Logiq E  Verbal informed consent obtained.  Time-out conducted.  Noted no overlying erythema, induration, or other signs of local infection.  Skin prepped in a sterile fashion.  Local anesthesia: Topical Ethyl chloride.  With sterile technique and under real time ultrasound guidance:  1/2 mL kenalog 40, 1/2 mL Marcaine injected easily. Completed without difficulty  Pain immediately resolved suggesting accurate placement of the medication.  Advised to call if fevers/chills, erythema, induration, drainage, or persistent bleeding.  Images permanently stored and available for review in the ultrasound unit.  Impression: Technically successful ultrasound guided injection.  Impression  and Recommendations:

## 2015-08-15 ENCOUNTER — Other Ambulatory Visit: Payer: Self-pay | Admitting: Family Medicine

## 2015-08-15 DIAGNOSIS — I10 Essential (primary) hypertension: Secondary | ICD-10-CM

## 2015-08-15 DIAGNOSIS — Z125 Encounter for screening for malignant neoplasm of prostate: Secondary | ICD-10-CM

## 2015-08-15 DIAGNOSIS — E785 Hyperlipidemia, unspecified: Secondary | ICD-10-CM

## 2015-08-15 LAB — COMPLETE METABOLIC PANEL WITH GFR
ALT: 25 U/L (ref 9–46)
AST: 24 U/L (ref 10–35)
Albumin: 4.1 g/dL (ref 3.6–5.1)
Alkaline Phosphatase: 69 U/L (ref 40–115)
BUN: 21 mg/dL (ref 7–25)
CHLORIDE: 102 mmol/L (ref 98–110)
CO2: 23 mmol/L (ref 20–31)
CREATININE: 0.95 mg/dL (ref 0.70–1.25)
Calcium: 8.9 mg/dL (ref 8.6–10.3)
GFR, Est African American: >89 mL/min (ref >=60)
GFR, Est Non African American: 85 mL/min (ref >=60)
Glucose, Bld: 86 mg/dL (ref 65–99)
Potassium: 4.3 mmol/L (ref 3.5–5.3)
Sodium: 136 mmol/L (ref 135–146)
TOTAL PROTEIN: 6.3 g/dL (ref 6.1–8.1)
Total Bilirubin: 0.8 mg/dL (ref 0.2–1.2)

## 2015-08-15 LAB — LIPID PANEL
CHOLESTEROL: 179 mg/dL (ref 125–200)
HDL: 46 mg/dL (ref >=40)
LDL CALC: 118 mg/dL (ref <130)
Total CHOL/HDL Ratio: 3.9 Ratio (ref <=5.0)
Triglycerides: 74 mg/dL (ref <150)
VLDL: 15 mg/dL (ref <30)

## 2015-08-16 LAB — PSA: PSA: 0.73 ng/mL (ref <=4.00)

## 2015-08-22 ENCOUNTER — Ambulatory Visit (INDEPENDENT_AMBULATORY_CARE_PROVIDER_SITE_OTHER): Payer: BLUE CROSS/BLUE SHIELD | Admitting: Family Medicine

## 2015-08-22 ENCOUNTER — Encounter: Payer: Self-pay | Admitting: Family Medicine

## 2015-08-22 VITALS — BP 135/74 | HR 69 | Wt 217.0 lb

## 2015-08-22 DIAGNOSIS — Z0189 Encounter for other specified special examinations: Secondary | ICD-10-CM | POA: Diagnosis not present

## 2015-08-22 DIAGNOSIS — Z0000 Encounter for general adult medical examination without abnormal findings: Secondary | ICD-10-CM

## 2015-08-22 NOTE — Patient Instructions (Signed)
Keep up a regular exercise program and make sure you are eating a healthy diet Try to eat 4 servings of dairy a day, or if you are lactose intolerant take a calcium with vitamin D daily.  Your vaccines are up to date.   

## 2015-08-22 NOTE — Progress Notes (Signed)
Subjective:    Patient ID: Joshua Warner, male    DOB: 1950-11-22, 65 y.o.   MRN: [id number]  HPI Here for CPE [facility] for labs. Here to review labs.   He is not getting any regular exercise right now but plans on starting a walking regimen.    Review of Systems Comprehensive ROS is neg.   BP 135/74 mmHg  Pulse 69  Wt 217 lb (98.431 kg)    Allergies  Allergen Reactions  . Celecoxib     REACTION: rash  . Sulfonamide Derivatives     REACTION: hives    Past Medical History  Diagnosis Date  . Hypertension since 2007  . Kidney stones 2009    Past Surgical History  Procedure Laterality Date  . Tonsillectomy  1957  . Knee arthroscopy  12/31/2010    Right knee     Social History   Social History  . Marital Status: Married    Spouse Name: N/A  . Number of Children: 2  . Years of Education: N/A   Occupational History  . Retired Merchant [employer] officer [employer]   Social History Main Topics  . Smoking status: Never Smoker   . Smokeless tobacco: Never Used  . Alcohol Use: 2.0 oz/week    4 drink(s) per week  . Drug Use: No  . Sexual Activity:    Partners: Female     Comment: 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   Other Topics Concern  . Not on file   Social History Narrative   No regular exercise. Retired from [employer]    Family History  Problem Relation Age of Onset  . Hypertension Mother   . Prostate cancer Maternal Uncle     Deceased  . Depression Maternal Uncle   . Cancer Paternal Grandfather   . Cancer Paternal Grandmother   . Heart disease Father     Outpatient Encounter Prescriptions as of 08/22/2015  Medication Sig  . b complex vitamins tablet Take 1 tablet by mouth daily.  . calcium carbonate (OS-CAL) 600 MG TABS Take 600 mg by mouth daily.  Marland Kitchen etodolac (LODINE XL) 500 MG 24 hr tablet Take 1 tablet (500 mg total) by mouth daily.  . Glucosamine-Chondroit-Vit C-Mn  (GLUCOSAMINE CHONDR 1500 COMPLX) CAPS Take by mouth daily.    Marland Kitchen loratadine (CLARITIN) 10 MG tablet Take 10 mg by mouth daily.  Marland Kitchen losartan (COZAAR) 100 MG tablet Take 1 tablet (100 mg total) by mouth daily.  . Multiple Vitamin (MULTIVITAMIN) tablet Take 1 tablet by mouth daily.    . Omega-3 Fatty Acids (FISH OIL) 1000 MG CAPS Take by mouth.  Marland Kitchen omeprazole (PRILOSEC) 20 MG capsule Take 1 capsule (20 mg total) by mouth daily.   No facility-administered encounter medications on file as of 08/22/2015.       Objective:   Physical Exam  Constitutional: He is oriented to person, place, and time. He appears well-developed and well-nourished.  HENT:  Head: Normocephalic and atraumatic.  Right Ear: External ear normal.  Left Ear: External ear normal.  Nose: Nose normal.  Mouth/Throat: Oropharynx is clear and moist.  Eyes: Conjunctivae and EOM are normal. Pupils are equal, round, and reactive to light.  Neck: Normal range of motion. Neck supple. No thyromegaly present.  Cardiovascular: Normal rate, regular rhythm, normal heart sounds and intact distal pulses.   Pulmonary/Chest: Effort normal and breath sounds normal.  Abdominal: Soft. Bowel sounds  are normal. He exhibits no distension and no mass. There is no tenderness. There is no rebound and no guarding.  Musculoskeletal: Normal range of motion.  Lymphadenopathy:    He has no cervical adenopathy.  Neurological: He is alert and oriented to person, place, and time. He has normal reflexes.  Skin: Skin is warm and dry.  Psychiatric: He has a normal mood and affect. His behavior is normal. Judgment and thought content normal.       Assessment & Plan:  CPE -  Keep up a regular exercise program and make sure you are eating a healthy diet Try to eat 4 servings of dairy a day, or if you are lactose intolerant take a calcium with vitamin D daily.  Your vaccines are up to date.   BMI 30 - work on diet and exercise. He plans on starting a walking  program.    Health Maintenance  Topic Date Due  . INFLUENZA VACCINE  01/27/2016  . COLONOSCOPY  04/17/2016  . TETANUS/TDAP  04/28/2018  . ZOSTAVAX  Completed  . Hepatitis C Screening  Completed  . HIV Screening  Completed

## 2015-09-19 ENCOUNTER — Ambulatory Visit (INDEPENDENT_AMBULATORY_CARE_PROVIDER_SITE_OTHER): Payer: BLUE CROSS/BLUE SHIELD | Admitting: Family Medicine

## 2015-09-19 ENCOUNTER — Encounter: Payer: Self-pay | Admitting: Family Medicine

## 2015-09-19 VITALS — BP 128/85 | HR 73 | Temp 98.4°F | Wt 216.0 lb

## 2015-09-19 DIAGNOSIS — R05 Cough: Secondary | ICD-10-CM | POA: Diagnosis not present

## 2015-09-19 DIAGNOSIS — J209 Acute bronchitis, unspecified: Secondary | ICD-10-CM

## 2015-09-19 DIAGNOSIS — R059 Cough, unspecified: Secondary | ICD-10-CM

## 2015-09-19 MED ORDER — HYDROCODONE-HOMATROPINE 5-1.5 MG/5ML PO SYRP: 5.0000 mL | ORAL_SOLUTION | Freq: Every evening | ORAL | Status: DC | PRN

## 2015-09-19 MED ORDER — BENZONATATE 200 MG PO CAPS: 200.0000 mg | ORAL_CAPSULE | Freq: Two times a day (BID) | ORAL | Status: DC | PRN

## 2015-09-19 NOTE — Patient Instructions (Signed)

## 2015-09-19 NOTE — Progress Notes (Signed)
   Subjective:    Patient ID: Joshua Warner, male    DOB: May 26, 1951, 65 y.o.   MRN: [id number]  HPI Patient complains of cough and fatigue that started yesterday afternoon. He feels like the cough is a little bit worse. He says he's noticed some wheezing intermittently. No chest pain. No significant shortness of breath. No sore throat. No ear pain or pressure. No runny nose. No nasal congestion. No fevers chills or sweats.   Review of Systems     Objective:   Physical Exam  Constitutional: He is oriented to person, place, and time. He appears well-developed and well-nourished.  HENT:  Head: Normocephalic and atraumatic.  Eyes: Conjunctivae and EOM are normal.  Cardiovascular: Normal rate.   Pulmonary/Chest: Effort normal.  Neurological: He is alert and oriented to person, place, and time.  Skin: Skin is dry. No pallor.  Psychiatric: He has a normal mood and affect. His behavior is normal.  Vitals reviewed.         Assessment & Plan:  Cough/acute bronchitis-likely viral. Recommend symptomatically care. Given a perception for cough medication for call if suddenly getting worse, shortness of breath, or develops a fever. Handout given.

## 2015-09-22 ENCOUNTER — Ambulatory Visit (INDEPENDENT_AMBULATORY_CARE_PROVIDER_SITE_OTHER): Payer: BLUE CROSS/BLUE SHIELD | Admitting: Physician Assistant

## 2015-09-22 ENCOUNTER — Encounter: Payer: Self-pay | Admitting: Physician Assistant

## 2015-09-22 VITALS — BP 130/80 | HR 70 | Temp 98.3°F | Ht 71.0 in | Wt 215.0 lb

## 2015-09-22 DIAGNOSIS — J208 Acute bronchitis due to other specified organisms: Secondary | ICD-10-CM | POA: Diagnosis not present

## 2015-09-22 MED ORDER — PREDNISONE 50 MG PO TABS: ORAL_TABLET | ORAL | Status: DC

## 2015-09-22 MED ORDER — AZITHROMYCIN 250 MG PO TABS: ORAL_TABLET | ORAL | Status: DC

## 2015-09-22 MED ORDER — IPRATROPIUM-ALBUTEROL 0.5-2.5 (3) MG/3ML IN SOLN
3.0000 mL | Freq: Once | RESPIRATORY_TRACT | Status: AC
  Administered 2015-09-22: 3 mL via RESPIRATORY_TRACT

## 2015-09-22 NOTE — Progress Notes (Signed)
   Subjective:    Patient ID: Joshua Warner, male    DOB: 03-14-1951, 65 y.o.   MRN: [id number]  HPI Pt is a 65 yo male who presents to the clinic with 5 days of cough, headache, and wheezing. He was seen by Dr. Madilyn Fireman on 09/19/15 and dx with bronchitis that was likely viral. He has continued to worsen since then. He admits to feeling like his chest is tight. At times it "hurts to breath". Cough is productive with clear to yellow sputum. No know fever. He does feel tired and achy. He has a lot of sinus pressure and headache. No ST or ear pain. He has taken tessalon pearle and hydrocodone cough syrup that metheney gave him. Has helped some.    Review of Systems  All other systems reviewed and are negative.      Objective:   Physical Exam  Constitutional: He is oriented to person, place, and time. He appears well-developed and well-nourished.  HENT:  Head: Normocephalic and atraumatic.  Right Ear: External ear normal.  Left Ear: External ear normal.  Nose: Nose normal.  Mouth/Throat: Oropharynx is clear and moist. No oropharyngeal exudate.  TM's clear.   Eyes: Conjunctivae are normal. Right eye exhibits no discharge. Left eye exhibits no discharge.  Neck: Normal range of motion. Neck supple.  Cardiovascular: Normal rate, regular rhythm and normal heart sounds.   Pulmonary/Chest:  Scant expiratory wheezing bilateral upper lungs.  No rhonchi or crackles.  Lymphadenopathy:    He has no cervical adenopathy.  Neurological: He is alert and oriented to person, place, and time.  Skin: Skin is dry.  Psychiatric: He has a normal mood and affect. His behavior is normal.          Assessment & Plan:  Acute bronchitis- duo neb given in office today with some symptoms relief. zpak and prednisone given today. Continue with cough syrup. Pt has acces to nebulizer with albuterol. Ok use every 4-6 hours as needed for cough and SOB.  Rest and hydrated. Follow up as needed.

## 2015-11-21 ENCOUNTER — Encounter: Payer: Self-pay | Admitting: Sports Medicine

## 2015-11-21 ENCOUNTER — Ambulatory Visit (INDEPENDENT_AMBULATORY_CARE_PROVIDER_SITE_OTHER): Payer: BLUE CROSS/BLUE SHIELD | Admitting: Sports Medicine

## 2015-11-21 VITALS — BP 118/82 | HR 64 | Resp 18 | Wt 218.4 lb

## 2015-11-21 DIAGNOSIS — M1990 Unspecified osteoarthritis, unspecified site: Secondary | ICD-10-CM | POA: Diagnosis not present

## 2015-11-21 DIAGNOSIS — M19049 Primary osteoarthritis, unspecified hand: Secondary | ICD-10-CM

## 2015-11-21 DIAGNOSIS — M7122 Synovial cyst of popliteal space [Baker], left knee: Secondary | ICD-10-CM | POA: Diagnosis not present

## 2015-11-21 NOTE — Progress Notes (Signed)
  Subjective:    CC: Hand pain, Baker's cyst  HPI: Left knee Baker's cyst: 3 years since previous aspiration and injection, having a recurrence, desires repeat aspiration and injection today. Symptoms are moderate, worsening.  Trapeziometacarpal osteoarthritis: Side is doing okay, recurrence of pain on the right, moderate, persistent and desires injection, previous injection was 4-1/2 months ago.  Past medical history, Surgical history, Family history not pertinant except as noted below, Social history, Allergies, and medications have been entered into the medical record, reviewed, and no changes needed.   Review of Systems: No fevers, chills, night sweats, weight loss, chest pain, or shortness of breath.   Objective:    General: Well Developed, well nourished, and in no acute distress.  Neuro: Alert and oriented x3, extra-ocular muscles intact, sensation grossly intact.  HEENT: Normocephalic, atraumatic, pupils equal round reactive to light, neck supple, no masses, no lymphadenopathy, thyroid nonpalpable.  Skin: Warm and dry, no rashes. Cardiac: Regular rate and rhythm, no murmurs rubs or gallops, no lower extremity edema.  Respiratory: Clear to auscultation bilaterally. Not using accessory muscles, speaking in full sentences. Left Knee: Normal to inspection with no erythema or effusion or obvious bony abnormalities. Palpable Baker's cyst. ROM normal in flexion and extension and lower leg rotation. Ligaments with solid consistent endpoints including ACL, PCL, LCL, MCL. Negative Mcmurray's and provocative meniscal tests. Non painful patellar compression. Patellar and quadriceps tendons unremarkable. Hamstring and quadriceps strength is normal.  Procedure: Real-time Ultrasound Guided aspiration/Injection of left knee Baker's cyst Device: GE Logiq E  Verbal informed consent obtained.  Time-out conducted.  Noted no overlying erythema, induration, or other signs of local infection.    Skin prepped in a sterile fashion.  Local anesthesia: Topical Ethyl chloride.  With sterile technique and under real time ultrasound guidance:  Using an 18-gauge needle aspirated 35 mL, straw-colored fluid, syringe switched and 1 mL lidocaine, 1 mL kenalog 40 injected easily. Completed without difficulty  Pain immediately resolved suggesting accurate placement of the medication.  Advised to call if fevers/chills, erythema, induration, drainage, or persistent bleeding.  Images permanently stored and available for review in the ultrasound unit.  Impression: Technically successful ultrasound guided injection.  Procedure: Real-time Ultrasound Guided Injection of right trapeziometacarpal joint Device: GE Logiq E  Verbal informed consent obtained.  Time-out conducted.  Noted no overlying erythema, induration, or other signs of local infection.  Skin prepped in a sterile fashion.  Local anesthesia: Topical Ethyl chloride.  With sterile technique and under real time ultrasound guidance:  30-gauge needle advanced into the joint and 1/2 mL lidocaine, 1/2 mL kenalog 40 injected easily. Completed without difficulty  Pain immediately resolved suggesting accurate placement of the medication.  Advised to call if fevers/chills, erythema, induration, drainage, or persistent bleeding.  Images permanently stored and available for review in the ultrasound unit.  Impression: Technically successful ultrasound guided injection.  Impression and Recommendations:    I spent 40 minutes with this patient, greater than 50% was face-to-face time counseling regarding the above diagnoses

## 2015-11-21 NOTE — Assessment & Plan Note (Signed)
Repeat right trapeziometacarpal joint injection. Previous injection provided for months of response. We will also provide bilateral thumb spica braces.

## 2015-11-21 NOTE — Assessment & Plan Note (Signed)
Repeat aspiration and injection.  Previously done over 3 years ago.

## 2015-12-27 DIAGNOSIS — I219 Acute myocardial infarction, unspecified: Secondary | ICD-10-CM

## 2015-12-27 HISTORY — DX: Acute myocardial infarction, unspecified: I21.9

## 2015-12-27 HISTORY — PX: CORONARY ANGIOPLASTY WITH STENT PLACEMENT: SHX49

## 2016-01-01 ENCOUNTER — Other Ambulatory Visit: Payer: Self-pay

## 2016-01-01 DIAGNOSIS — I10 Essential (primary) hypertension: Secondary | ICD-10-CM

## 2016-01-01 MED ORDER — LOSARTAN POTASSIUM 100 MG PO TABS: 100.0000 mg | ORAL_TABLET | Freq: Every day | ORAL | Status: DC

## 2016-01-03 DIAGNOSIS — I259 Chronic ischemic heart disease, unspecified: Secondary | ICD-10-CM | POA: Insufficient documentation

## 2016-01-03 DIAGNOSIS — Z955 Presence of coronary angioplasty implant and graft: Secondary | ICD-10-CM | POA: Insufficient documentation

## 2016-01-03 DIAGNOSIS — I442 Atrioventricular block, complete: Secondary | ICD-10-CM | POA: Insufficient documentation

## 2016-01-03 DIAGNOSIS — R0600 Dyspnea, unspecified: Secondary | ICD-10-CM | POA: Insufficient documentation

## 2016-01-03 DIAGNOSIS — R55 Syncope and collapse: Secondary | ICD-10-CM | POA: Insufficient documentation

## 2016-01-03 DIAGNOSIS — I2510 Atherosclerotic heart disease of native coronary artery without angina pectoris: Secondary | ICD-10-CM | POA: Insufficient documentation

## 2016-01-03 DIAGNOSIS — I213 ST elevation (STEMI) myocardial infarction of unspecified site: Secondary | ICD-10-CM | POA: Insufficient documentation

## 2016-01-07 ENCOUNTER — Telehealth: Payer: Self-pay | Admitting: Family Medicine

## 2016-01-07 NOTE — Telephone Encounter (Signed)
I would recommend conservative therapy initially especially if he just had a heart attack. Make sure that bedroom is cool quiet and dark. Try to go to bed at the same time. Try to wake up at the same time to reset the sleep cycle. It's not unusual after being in the hospital to completely disrupt the sleep cycle. I would recommend something natural like melatonin which helps trigger the sleep part of the brain. This can be found over-the-counter and can be taken about half an hour, up to one hour before bedtime and does not tend to cause grogginess or sedation the following day. If that is not effective than I recommend a trial of an antihistamine such as Benadryl which can actually be quite effective.

## 2016-01-07 NOTE — Telephone Encounter (Signed)
Pt called clinic, states he had a heart attack over the weekend. Pt went to [facility], and has an appt to follow up with Cardiology on 01/12/16. Pt request something to help him sleep. States he called the Cardiologist but they will not write anything since they have not established care yet. Will route to PCP. Pt advised it would not be reviewed until tomorrow am, verbalized understanding. Pt prefers [facility] in [place].

## 2016-01-07 NOTE — Telephone Encounter (Signed)
Pt advised of recommendations, verbalized understanding. States he will get some Melatonin and try it. No further questions at this time.

## 2016-02-16 ENCOUNTER — Telehealth: Payer: Self-pay | Admitting: Family Medicine

## 2016-02-16 NOTE — Telephone Encounter (Signed)
Left message advising of recommendations.  

## 2016-02-16 NOTE — Telephone Encounter (Signed)
Please call patient with him know that I did receive a letter from Morene Antu over at [facility] saying that they felt that he was high risk for possible sleep apnea and recommended that he consider further evaluation. I believe he has an appointment later this week so we can certainly discuss this in detail but just wanted to let him know that I had received notification that he could possibly be at risk for sleep apnea.

## 2016-02-20 ENCOUNTER — Encounter: Payer: Self-pay | Admitting: Family Medicine

## 2016-02-20 ENCOUNTER — Ambulatory Visit (INDEPENDENT_AMBULATORY_CARE_PROVIDER_SITE_OTHER): Payer: BLUE CROSS/BLUE SHIELD | Admitting: Family Medicine

## 2016-02-20 VITALS — BP 112/75 | HR 63 | Ht 71.0 in | Wt 214.0 lb

## 2016-02-20 DIAGNOSIS — M79644 Pain in right finger(s): Secondary | ICD-10-CM

## 2016-02-20 DIAGNOSIS — M1990 Unspecified osteoarthritis, unspecified site: Secondary | ICD-10-CM | POA: Diagnosis not present

## 2016-02-20 DIAGNOSIS — M19049 Primary osteoarthritis, unspecified hand: Secondary | ICD-10-CM

## 2016-02-20 DIAGNOSIS — I10 Essential (primary) hypertension: Secondary | ICD-10-CM

## 2016-02-20 DIAGNOSIS — I2111 ST elevation (STEMI) myocardial infarction involving right coronary artery: Secondary | ICD-10-CM | POA: Diagnosis not present

## 2016-02-20 DIAGNOSIS — Z23 Encounter for immunization: Secondary | ICD-10-CM | POA: Diagnosis not present

## 2016-02-20 DIAGNOSIS — I2510 Atherosclerotic heart disease of native coronary artery without angina pectoris: Secondary | ICD-10-CM

## 2016-02-20 MED ORDER — LOSARTAN POTASSIUM 100 MG PO TABS: 100.0000 mg | ORAL_TABLET | Freq: Every day | ORAL | 2 refills | Status: DC

## 2016-02-20 NOTE — Assessment & Plan Note (Signed)
Repeat right trapeziometacarpal joint injection as above, previous injection was 3 months ago. I think we are nearing thumb suspension surgery.

## 2016-02-20 NOTE — Progress Notes (Signed)
  Subjective:    CC: Right thumb pain  HPI: This is a pleasant 65 year old male, he has known trapeziometacarpal osteoarthritis, previous injection into the right side was about 3 months ago. He is having recurrence of pain and desires repeat interventional treatment today. Pain is moderate, persistent, localized the base of the thumb without radiation.  Past medical history, Surgical history, Family history not pertinant except as noted below, Social history, Allergies, and medications have been entered into the medical record, reviewed, and no changes needed.   Review of Systems: No fevers, chills, night sweats, weight loss, chest pain, or shortness of breath.   Objective:    General: Well Developed, well nourished, and in no acute distress.  Neuro: Alert and oriented x3, extra-ocular muscles intact, sensation grossly intact.  HEENT: Normocephalic, atraumatic, pupils equal round reactive to light, neck supple, no masses, no lymphadenopathy, thyroid nonpalpable.  Skin: Warm and dry, no rashes. Cardiac: Regular rate and rhythm, no murmurs rubs or gallops, no lower extremity edema.  Respiratory: Clear to auscultation bilaterally. Not using accessory muscles, speaking in full sentences. Right hand: Tender to palpation at the trapeziometacarpal joint with a typical squared off appearance.  Procedure: Real-time Ultrasound Guided Injection of right trapeziometacarpal joint Device: GE Logiq E  Verbal informed consent obtained.  Time-out conducted.  Noted no overlying erythema, induration, or other signs of local infection.  Skin prepped in a sterile fashion.  Local anesthesia: Topical Ethyl chloride.  With sterile technique and under real time ultrasound guidance:  30-gauge needle advanced into joint and 1/2 mL kenalog 40, 1/2 mL lidocaine injected easily. Completed without difficulty  Pain immediately resolved suggesting accurate placement of the medication.  Advised to call if  fevers/chills, erythema, induration, drainage, or persistent bleeding.  Images permanently stored and available for review in the ultrasound unit.  Impression: Technically successful ultrasound guided injection.  Impression and Recommendations:    Arthritis of carpometacarpal joint Repeat right trapeziometacarpal joint injection as above, previous injection was 3 months ago. I think we are nearing thumb suspension surgery.

## 2016-02-20 NOTE — Progress Notes (Signed)
Subjective:    CC: HTN  HPI: Hypertension- Pt denies chest pain, SOB, dizziness, or heart palpitations.  Taking meds as directed w/o problems.  Denies medication side effects.    He had an MI on July 8th.  He had 3 cardiac stents placed and feels like he is doing well. He is enrolled in cardiac rehab.  He was seen at [facility] and is followed by Dr. Donald Pore.  Vitamin going to the gym on the days that he is not at rehabilitation.  Right thumb pain-he has been taking his etodolac. The nurse at the rehabilitation encouraged him to speak with me about this. Is also been taking a Tylenol PM at bedtime. His last injection was about 4 months ago and he did well with this. He would like to get another injection if possible.  BP 112/75   Pulse 63   Ht 5\' 11"  (1.803 m)   Wt 214 lb (97.1 kg)   SpO2 97%   BMI 29.85 kg/m     Allergies  Allergen Reactions  . Celecoxib     REACTION: rash  . Sulfonamide Derivatives     REACTION: hives    Past Medical History:  Diagnosis Date  . Hypertension since 2007  . Kidney stones 2009    Past Surgical History:  Procedure Laterality Date  . KNEE ARTHROSCOPY  12/31/2010   Right knee   . TONSILLECTOMY  1957    Social History   Social History  . Marital status: Married    Spouse name: N/A  . Number of children: 2  . Years of education: N/A   Occupational History  . Retired Merchant [employer] officer [employer]   Social History Main Topics  . Smoking status: Never Smoker  . Smokeless tobacco: Never Used  . Alcohol use 2.0 oz/week    4 drink(s) per week  . Drug use: No  . Sexual activity: Yes    Partners: Female     Comment: 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   Other Topics Concern  . Not on file   Social History Narrative   No regular exercise. Retired from [employer]    Family History  Problem Relation Age of Onset  . Hypertension Mother   . Prostate  cancer Maternal Uncle     Deceased  . Depression Maternal Uncle   . Cancer Paternal Grandfather   . Cancer Paternal Grandmother   . Heart disease Father     Outpatient Encounter Prescriptions as of 02/20/2016  Medication Sig  . aspirin 81 MG chewable tablet Chew 81 mg by mouth daily.  Marland Kitchen atorvastatin (LIPITOR) 80 MG tablet Take 80 mg by mouth at bedtime.  Marland Kitchen b complex vitamins tablet Take 1 tablet by mouth daily.  . calcium carbonate (OS-CAL) 600 MG TABS Take 600 mg by mouth daily.  . Glucosamine-Chondroit-Vit C-Mn (GLUCOSAMINE CHONDR 1500 COMPLX) CAPS Take by mouth daily.    Marland Kitchen loratadine (CLARITIN) 10 MG tablet Take 10 mg by mouth daily.  Marland Kitchen losartan (COZAAR) 100 MG tablet Take 1 tablet (100 mg total) by mouth daily.  . metoprolol tartrate (LOPRESSOR) 25 MG tablet Take 12.5 mg by mouth 2 (two) times daily.  . Multiple Vitamin (MULTIVITAMIN) tablet Take 1 tablet by mouth daily.    . nitroGLYCERIN (NITROSTAT) 0.4 MG SL tablet Place 0.4 mg under the tongue every 5 (five) minutes as needed.  Marland Kitchen omeprazole (PRILOSEC) 20 MG capsule  Take 1 capsule (20 mg total) by mouth daily.  . prasugrel (EFFIENT) 10 MG TABS tablet Take 10 mg by mouth.  . [DISCONTINUED] losartan (COZAAR) 100 MG tablet Take 1 tablet (100 mg total) by mouth daily.  . [DISCONTINUED] etodolac (LODINE XL) 500 MG 24 hr tablet Take 1 tablet (500 mg total) by mouth daily.  . [DISCONTINUED] Omega-3 Fatty Acids (FISH OIL) 1000 MG CAPS Take by mouth.   No facility-administered encounter medications on file as of 02/20/2016.         Review of Systems: No fevers, chills, night sweats, weight loss, chest pain, or shortness of breath.   Objective:    General: Well Developed, well nourished, and in no acute distress.  Neuro: Alert and oriented x3, extra-ocular muscles intact, sensation grossly intact.  HEENT: Normocephalic, atraumatic  Skin: Warm and dry, no rashes. Cardiac: Regular rate and rhythm, no murmurs rubs or gallops, no lower  extremity edema.  Respiratory: Clear to auscultation bilaterally. Not using accessory muscles, speaking in full sentences.   Impression and Recommendations:   HTN - Controlled. Continue current regimen. Follow-up in 6 months.  CAD- STable. Into 2nd week of cardiac rehab. Aspirin, Effient, beta blocker, statin. Keep follow-up with cardiology.  Right thumb pain-we'll have him seen by our sports medicine provider for injection today. He is to avoid all NSAIDs for the next year while he is on Effient and aspirin. Reviewed this with him. He can use Tylenol as needed for pain control.  Flu vaccine given today.

## 2016-04-08 ENCOUNTER — Other Ambulatory Visit: Payer: Self-pay | Admitting: Family Medicine

## 2016-04-08 DIAGNOSIS — K219 Gastro-esophageal reflux disease without esophagitis: Secondary | ICD-10-CM

## 2016-06-11 ENCOUNTER — Ambulatory Visit (INDEPENDENT_AMBULATORY_CARE_PROVIDER_SITE_OTHER): Payer: BLUE CROSS/BLUE SHIELD | Admitting: Sports Medicine

## 2016-06-11 ENCOUNTER — Encounter: Payer: Self-pay | Admitting: Sports Medicine

## 2016-06-11 DIAGNOSIS — M19049 Primary osteoarthritis, unspecified hand: Secondary | ICD-10-CM | POA: Diagnosis not present

## 2016-06-11 NOTE — Progress Notes (Signed)
  Subjective:    CC: Right thumb pain  HPI: Joshua Warner has known trapeziometacarpal osteoarthritis, been almost 4 months since his last injection, he desires repeat injection today, pain is moderate, persistent, localized without radiation.  Past medical history:  Negative.  See flowsheet/record as well for more information.  Surgical history: Negative.  See flowsheet/record as well for more information.  Family history: Negative.  See flowsheet/record as well for more information.  Social history: Negative.  See flowsheet/record as well for more information.  Allergies, and medications have been entered into the medical record, reviewed, and no changes needed.   Review of Systems: No fevers, chills, night sweats, weight loss, chest pain, or shortness of breath.   Objective:    General: Well Developed, well nourished, and in no acute distress.  Neuro: Alert and oriented x3, extra-ocular muscles intact, sensation grossly intact.  HEENT: Normocephalic, atraumatic, pupils equal round reactive to light, neck supple, no masses, no lymphadenopathy, thyroid nonpalpable.  Skin: Warm and dry, no rashes. Cardiac: Regular rate and rhythm, no murmurs rubs or gallops, no lower extremity edema.  Respiratory: Clear to auscultation bilaterally. Not using accessory muscles, speaking in full sentences. Right hand: Tender to palpation at the base of the first metacarpal with a squared off typical appearance for basal joint osteoarthritis.  Procedure: Real-time Ultrasound Guided Injection of right thumb basal joint Device: GE Logiq E  Verbal informed consent obtained.  Time-out conducted.  Noted no overlying erythema, induration, or other signs of local infection.  Skin prepped in a sterile fashion.  Local anesthesia: Topical Ethyl chloride.  With sterile technique and under real time ultrasound guidance:  30-gauge needle used to enter the joint and 1/2 mL kenalog 40, 1/2 mL lidocaine injected  easily. Completed without difficulty  Pain immediately resolved suggesting accurate placement of the medication.  Advised to call if fevers/chills, erythema, induration, drainage, or persistent bleeding.  Images permanently stored and available for review in the ultrasound unit.  Impression: Technically successful ultrasound guided injection.  Impression and Recommendations:    Arthritis of carpometacarpal joint Right trapeziometacarpal joint injection as above. This injection lasted almost 4 months. Return to see me as needed, he goes on Medicare in June and will be a candidate for thumb suspension surgery.

## 2016-06-11 NOTE — Assessment & Plan Note (Signed)
Right trapeziometacarpal joint injection as above. This injection lasted almost 4 months. Return to see me as needed, he goes on Medicare in June and will be a candidate for thumb suspension surgery.

## 2016-08-13 ENCOUNTER — Telehealth: Payer: Self-pay

## 2016-08-13 NOTE — Telephone Encounter (Signed)
Pt have a upcoming appointment. He is wanting his labs drawn before. Please advise.

## 2016-08-13 NOTE — Telephone Encounter (Signed)
Due for psa, cmp, ;ipids

## 2016-08-16 ENCOUNTER — Other Ambulatory Visit: Payer: Self-pay | Admitting: Family Medicine

## 2016-08-16 DIAGNOSIS — E785 Hyperlipidemia, unspecified: Secondary | ICD-10-CM

## 2016-08-16 DIAGNOSIS — I10 Essential (primary) hypertension: Secondary | ICD-10-CM

## 2016-08-16 DIAGNOSIS — E291 Testicular hypofunction: Secondary | ICD-10-CM

## 2016-08-16 NOTE — Telephone Encounter (Signed)
Pt.notified

## 2016-08-21 LAB — COMPLETE METABOLIC PANEL WITH GFR
ALT: 29 U/L (ref 9–46)
AST: 34 U/L (ref 10–35)
Albumin: 4.1 g/dL (ref 3.6–5.1)
Alkaline Phosphatase: 61 U/L (ref 40–115)
BUN: 15 mg/dL (ref 7–25)
CALCIUM: 9.1 mg/dL (ref 8.6–10.3)
CO2: 22 mmol/L (ref 20–31)
Chloride: 107 mmol/L (ref 98–110)
Creat: 1.04 mg/dL (ref 0.70–1.25)
GFR, Est African American: 87 mL/min (ref >=60)
GFR, Est Non African American: 76 mL/min (ref >=60)
Glucose, Bld: 87 mg/dL (ref 65–99)
POTASSIUM: 4.2 mmol/L (ref 3.5–5.3)
Sodium: 137 mmol/L (ref 135–146)
Total Bilirubin: 0.9 mg/dL (ref 0.2–1.2)
Total Protein: 6.2 g/dL (ref 6.1–8.1)

## 2016-08-21 LAB — LIPID PANEL
CHOL/HDL RATIO: 1.9 ratio (ref <5.0)
Cholesterol: 89 mg/dL (ref <200)
HDL: 48 mg/dL (ref >40)
LDL Cholesterol: 31 mg/dL (ref <100)
Triglycerides: 48 mg/dL (ref <150)
VLDL: 10 mg/dL (ref <30)

## 2016-08-23 LAB — PSA, TOTAL AND FREE
PSA, % Free: 50 % (ref >25)
PSA, FREE: 0.3 ng/mL
PSA, Total: 0.6 ng/mL (ref <=4.0)

## 2016-08-27 ENCOUNTER — Encounter: Payer: Self-pay | Admitting: Family Medicine

## 2016-08-27 ENCOUNTER — Ambulatory Visit (INDEPENDENT_AMBULATORY_CARE_PROVIDER_SITE_OTHER): Payer: BLUE CROSS/BLUE SHIELD | Admitting: Family Medicine

## 2016-08-27 VITALS — BP 122/73 | HR 51 | Ht 71.0 in | Wt 201.0 lb

## 2016-08-27 DIAGNOSIS — I10 Essential (primary) hypertension: Secondary | ICD-10-CM

## 2016-08-27 DIAGNOSIS — Z1211 Encounter for screening for malignant neoplasm of colon: Secondary | ICD-10-CM

## 2016-08-27 DIAGNOSIS — Z0000 Encounter for general adult medical examination without abnormal findings: Secondary | ICD-10-CM | POA: Diagnosis not present

## 2016-08-27 MED ORDER — LOSARTAN POTASSIUM 100 MG PO TABS
100.0000 mg | ORAL_TABLET | Freq: Every day | ORAL | 2 refills | Status: DC
Start: 2016-08-27 — End: 2016-09-22

## 2016-08-27 NOTE — Patient Instructions (Signed)
Keep up a regular exercise program and make sure you are eating a healthy diet Try to eat 4 servings of dairy a day, or if you are lactose intolerant take a calcium with vitamin D daily.  Your vaccines are up to date.   

## 2016-08-27 NOTE — Progress Notes (Signed)
Subjective:    CC: Wellness Exam.   HPI:  66 you male here for CPE today. NO concerns. He is exercising about 4-5 days per week. Says he continue it after completeing cardiac rehab in the fall. He has lost 16 lbs in the last year.  He has been feeling well.  He does bruise eadily on the effient.    Past medical history, Surgical history, Family history not pertinant except as noted below, Social history, Allergies, and medications have been entered into the medical record, reviewed, and corrections made.   Review of Systems: No fevers, chills, night sweats, weight loss, chest pain, or shortness of breath.   Objective:    General: Well Developed, well nourished, and in no acute distress.  Neuro: Alert and oriented x3, extra-ocular muscles intact, sensation grossly intact.  HEENT: Normocephalic, atraumatic  Skin: Warm and dry, no rashes. Cardiac: Regular rate and rhythm, no murmurs rubs or gallops, no lower extremity edema.  Respiratory: Clear to auscultation bilaterally. Not using accessory muscles, speaking in full sentences.   Impression and Recommendations:    Keep up a regular exercise program and make sure you are eating a healthy diet Try to eat 4 servings of dairy a day, or if you are lactose intolerant take a calcium with vitamin D daily.  Your vaccines are up to date.  Refer for colonscopy.

## 2016-09-10 ENCOUNTER — Encounter: Payer: Self-pay | Admitting: Sports Medicine

## 2016-09-10 ENCOUNTER — Ambulatory Visit (INDEPENDENT_AMBULATORY_CARE_PROVIDER_SITE_OTHER): Payer: BLUE CROSS/BLUE SHIELD | Admitting: Sports Medicine

## 2016-09-10 DIAGNOSIS — M19049 Primary osteoarthritis, unspecified hand: Secondary | ICD-10-CM | POA: Diagnosis not present

## 2016-09-10 NOTE — Assessment & Plan Note (Signed)
Didn't quite have a full three-month response to the last right trapeziometacarpal joint injection. Repeat right trapeziometacarpal joint injection today, at this point and do think he is a candidate for a thumb suspension, referral to Dr. Amedeo Plenty

## 2016-09-10 NOTE — Progress Notes (Signed)
  Subjective:    CC: Right thumb pain  HPI: Joshua Warner is a pleasant 66 year old male, he comes in with recurrence of right hand pain, he has known trapeziometacarpal osteoarthritis, previous injection was 3 months ago but did not provide a solid 3 months of relief. Pain is moderate, persistent, localized without radiation, desires repeat injection today.  Past medical history:  Negative.  See flowsheet/record as well for more information.  Surgical history: Negative.  See flowsheet/record as well for more information.  Family history: Negative.  See flowsheet/record as well for more information.  Social history: Negative.  See flowsheet/record as well for more information.  Allergies, and medications have been entered into the medical record, reviewed, and no changes needed.   Review of Systems: No fevers, chills, night sweats, weight loss, chest pain, or shortness of breath.   Objective:    General: Well Developed, well nourished, and in no acute distress.  Neuro: Alert and oriented x3, extra-ocular muscles intact, sensation grossly intact.  HEENT: Normocephalic, atraumatic, pupils equal round reactive to light, neck supple, no masses, no lymphadenopathy, thyroid nonpalpable.  Skin: Warm and dry, no rashes. Cardiac: Regular rate and rhythm, no murmurs rubs or gallops, no lower extremity edema.  Respiratory: Clear to auscultation bilaterally. Not using accessory muscles, speaking in full sentences.  Procedure: Real-time Ultrasound Guided Injection of right trapeziometacarpal joint Device: GE Logiq E  Verbal informed consent obtained.  Time-out conducted.  Noted no overlying erythema, induration, or other signs of local infection.  Skin prepped in a sterile fashion.  Local anesthesia: Topical Ethyl chloride.  With sterile technique and under real time ultrasound guidance:  I dropped the needle down between trapezium and the first metacarpal and injected 1/2 mL kenalog 40, 1/2 mL  lidocaine. Completed without difficulty  Pain immediately resolved suggesting accurate placement of the medication.  Advised to call if fevers/chills, erythema, induration, drainage, or persistent bleeding.  Images permanently stored and available for review in the ultrasound unit.  Impression: Technically successful ultrasound guided injection.  Impression and Recommendations:    Arthritis of carpometacarpal joint Didn't quite have a full three-month response to the last right trapeziometacarpal joint injection. Repeat right trapeziometacarpal joint injection today, at this point and do think he is a candidate for a thumb suspension, referral to Dr. Amedeo Plenty

## 2016-09-22 ENCOUNTER — Other Ambulatory Visit: Payer: Self-pay | Admitting: Emergency Medicine

## 2016-09-22 DIAGNOSIS — I10 Essential (primary) hypertension: Secondary | ICD-10-CM

## 2016-09-22 MED ORDER — LOSARTAN POTASSIUM 100 MG PO TABS: 100.0000 mg | ORAL_TABLET | Freq: Every day | ORAL | 2 refills | Status: DC

## 2016-09-23 ENCOUNTER — Ambulatory Visit (INDEPENDENT_AMBULATORY_CARE_PROVIDER_SITE_OTHER): Payer: BLUE CROSS/BLUE SHIELD | Admitting: Family Medicine

## 2016-09-23 ENCOUNTER — Encounter: Payer: Self-pay | Admitting: Family Medicine

## 2016-09-23 VITALS — BP 122/67 | HR 62 | Ht 71.0 in | Wt 199.0 lb

## 2016-09-23 DIAGNOSIS — H10022 Other mucopurulent conjunctivitis, left eye: Secondary | ICD-10-CM

## 2016-09-23 MED ORDER — POLYMYXIN B-TRIMETHOPRIM 10000-0.1 UNIT/ML-% OP SOLN: 2.0000 [drp] | Freq: Four times a day (QID) | OPHTHALMIC | 0 refills | Status: DC

## 2016-09-23 NOTE — Patient Instructions (Addendum)
Viral Conjunctivitis, Adult Viral conjunctivitis is an inflammation of the clear membrane that covers the white part of your eye and the inner surface of your eyelid (conjunctiva). The inflammation is caused by a viral infection. The blood vessels in the conjunctiva become inflamed, causing the eye to become red or pink, and often itchy. Viral conjunctivitis can be easily passed from one person to another (is contagious). This condition is often called pink eye. What are the causes? This condition is caused by a virus. A virus is a type of contagious germ. It can be spread by touching objects that have been contaminated with the virus, such as doorknobs or towels. It can also be passed through droplets, such as from coughing or sneezing. What are the signs or symptoms? Symptoms of this condition include:  Eye redness.  Tearing or watery eyes.  Itchy and irritated eyes.  Burning feeling in the eyes.  Clear drainage from the eye.  Swollen eyelids.  A gritty feeling in the eye.  Light sensitivity. This condition often occurs with other symptoms, such as a fever, nausea, or a rash. How is this diagnosed? This condition is diagnosed with a medical history and physical exam. If you have discharge from your eye, the discharge may be tested to rule out other causes of conjunctivitis. How is this treated? Viral conjunctivitis does not respond to medicines that kill bacteria (antibiotics). Treatment for viral conjunctivitis is directed at stopping a bacterial infection from developing in addition to the viral infection. Treatment also aims to relieve your symptoms, such as itching. This may be done with antihistamine drops or other eye medicines. Rarely, steroid eye drops or antiviral medicines may be prescribed. Follow these instructions at home: Medicines    Take or apply over-the-counter and prescription medicines only as told by your health care provider.  Be very careful to avoid touching  the edge of the eyelid with the eye drop bottle or ointment tube when applying medicines to the affected eye. Being careful this way will stop you from spreading the infection to the other eye or to other people. Eye care   Avoid touching or rubbing your eyes.  Apply a warm, wet, clean washcloth to your eye for 10-20 minutes, 3-4 times per day or as told by your health care provider.  If you wear contact lenses, do not wear them until the inflammation is gone and your health care provider says it is safe to wear them again. Ask your health care provider how to sterilize or replace your contact lenses before using them again. Wear glasses until you can resume wearing contacts.  Avoid wearing eye makeup until the inflammation is gone. Throw away any old eye cosmetics that may be contaminated.  Gently wipe away any drainage from your eye with a warm, wet washcloth or a cotton ball. General instructions   Change or wash your pillowcase every day or as told by your health care provider.  Do not share towels, pillowcases, washcloths, eye makeup, makeup brushes, contact lenses, or glasses. This may spread the infection.  Wash your hands often with soap and water. Use paper towels to dry your hands. If soap and water are not available, use hand sanitizer.  Try to avoid contact with other people for one week or as told by your health care provider. Contact a health care provider if:  Your symptoms do not improve with treatment or they get worse.  You have increased pain.  Your vision becomes blurry.  You   have a fever.  You have facial pain, redness, or swelling.  You have yellow or green drainage coming from your eye.  You have new symptoms. This information is not intended to replace advice given to you by your health care provider. Make sure you discuss any questions you have with your health care provider. Document Released: 09/04/2002 Document Revised: 01/10/2016 Document Reviewed:  12/30/2015 Elsevier Interactive Patient Education  2017 Elsevier Inc.  

## 2016-09-23 NOTE — Progress Notes (Signed)
   Subjective:    Patient ID: Joshua Warner, male    DOB: June 05, 1951, 66 y.o.   MRN: [id number]  HPI Says woke up this AM with a pink left eye.  Eye feels a little dry but no pain or discharge.  No swelling.  NO irritation or itching.  He says he actually took a piece of paper from someone that he met at church on Sunday who had pinkeye that day. He's not had any fevers chills or sweats or other upper respiratory symptoms. No vision changes.   Review of Systems     Objective:   Physical Exam  Constitutional: He is oriented to person, place, and time. He appears well-developed and well-nourished.  HENT:  Head: Normocephalic and atraumatic.  Right Ear: External ear normal.  Left Ear: External ear normal.  Nose: Nose normal.  Mouth/Throat: Oropharynx is clear and moist.  TMs and canals are clear.   Eyes: Conjunctivae and EOM are normal. Pupils are equal, round, and reactive to light.  Sclera is pink and injected, worse on the lateral side.   Neck: Neck supple. No thyromegaly present.  Cardiovascular: Normal rate and normal heart sounds.   Pulmonary/Chest: Effort normal and breath sounds normal.  Lymphadenopathy:    He has no cervical adenopathy.  Neurological: He is alert and oriented to person, place, and time.  Skin: Skin is warm and dry.  Psychiatric: He has a normal mood and affect.        Assessment & Plan:  Conjunctivitis - likely viral but if not better by Sat can start the eye drops.  Conservative care with cool compresses. Call if worse or starts to notice any vision changes.

## 2016-10-01 LAB — HM COLONOSCOPY

## 2016-11-18 ENCOUNTER — Ambulatory Visit (INDEPENDENT_AMBULATORY_CARE_PROVIDER_SITE_OTHER): Payer: BLUE CROSS/BLUE SHIELD | Admitting: Family Medicine

## 2016-11-18 ENCOUNTER — Encounter: Payer: Self-pay | Admitting: Family Medicine

## 2016-11-18 ENCOUNTER — Ambulatory Visit: Payer: BLUE CROSS/BLUE SHIELD | Attending: Family Medicine | Admitting: Physical Therapy

## 2016-11-18 VITALS — BP 117/78 | HR 57 | Ht 71.0 in | Wt 199.0 lb

## 2016-11-18 DIAGNOSIS — H8112 Benign paroxysmal vertigo, left ear: Secondary | ICD-10-CM | POA: Diagnosis not present

## 2016-11-18 DIAGNOSIS — R42 Dizziness and giddiness: Secondary | ICD-10-CM | POA: Diagnosis present

## 2016-11-18 NOTE — Patient Instructions (Signed)
Sit to Side-Lying    Sit on edge of bed. 1. Turn head 45 to Right. 2. Maintain head position and lie down slowly on Left side. Hold until symptoms subside plus 30 seconds. 3. Sit up slowly. Hold until symptoms subside plus 30 seconds. 4. Turn head 45 to left . 5. Maintain head position and lie down slowly on right side. Hold until symptoms subside plus 30 seconds. 6. Sit up slowly.  Hold until symptoms subside plus 30 seconds.      Repeat sequence __3-5__ times per session. Do _3-5___ sessions per day.  You may stop this exercise when you go 2 full days symptoms free.   Gaze Stabilization: Sitting    Keeping eyes on target on wall \\_10N  feet away, tilt head down 15-30 and move head side to side for __30__ seconds. Repeat while moving head up and down for __30__ seconds. Do _2-3___ sessions per day.   Epley Maneuver

## 2016-11-18 NOTE — Progress Notes (Signed)
Subjective:    Patient ID: Joshua Warner, male    DOB: 11/18/1950, 66 y.o.   MRN: [id number]  HPI 66 year old male comes in today complaining of vertigo. He says he started on Sunday. He remembers tilting his head back in the shower when he suddenly felt dizzy. It happened again yesterday when he was walking up steps. He actually felt a little nauseated with it yesterday. He had similar symptoms about 18 years ago. He says that it's worse when he lays down. He took some old motion sickness medication.  Helped some.  Tried to do a Artist that he saw on YouTube.  He actually feels a little bit better today. No recent upper respiratory infection. No fevers chills or sweats. No ear pain or sore throat.   Review of Systems  BP 117/78   Pulse (!) 57   Ht 5\' 11"  (1.803 m)   Wt 199 lb (90.3 kg)   SpO2 95%   BMI 27.75 kg/m     Allergies  Allergen Reactions  . Celecoxib     REACTION: rash  . Sulfonamide Derivatives     REACTION: hives    Past Medical History:  Diagnosis Date  . Hypertension since 2007  . Kidney stones 2009    Past Surgical History:  Procedure Laterality Date  . KNEE ARTHROSCOPY  12/31/2010   Right knee   . TONSILLECTOMY  1957    Social History   Social History  . Marital status: Married    Spouse name: N/A  . Number of children: 2  . Years of education: N/A   Occupational History  . Retired [employer]chant [employer] officer [employer]   Social History Main Topics  . Smoking status: Never Smoker  . Smokeless tobacco: Never Used  . Alcohol use 2.0 oz/week    4 drink(s) per week  . Drug use: No  . Sexual activity: Yes    Partners: Female     Comment: 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   Other Topics Concern  . Not on file   Social History Narrative   No regular exercise. Retired from [employer]    Family History  Problem Relation Age of Onset  . Hypertension Mother   .  Prostate cancer Maternal Uncle        Deceased  . Depression Maternal Uncle   . Cancer Paternal Grandfather   . Cancer Paternal Grandmother   . Heart disease Father     Outpatient Encounter Prescriptions as of 11/18/2016  Medication Sig  . aspirin 81 MG chewable tablet Chew 81 mg by mouth daily.  Marland Kitchen atorvastatin (LIPITOR) 80 MG tablet Take 80 mg by mouth at bedtime.  Marland Kitchen b complex vitamins tablet Take 1 tablet by mouth daily.  . Glucosamine-Chondroit-Vit C-Mn (GLUCOSAMINE CHONDR 1500 COMPLX) CAPS Take by mouth daily.    Marland Kitchen loratadine (CLARITIN) 10 MG tablet Take 10 mg by mouth daily.  Marland Kitchen losartan (COZAAR) 100 MG tablet Take 1 tablet (100 mg total) by mouth daily.  . metoprolol tartrate (LOPRESSOR) 25 MG tablet Take 12.5 mg by mouth 2 (two) times daily.  . Multiple Vitamin (MULTIVITAMIN) tablet Take 1 tablet by mouth daily.    . nitroGLYCERIN (NITROSTAT) 0.4 MG SL tablet Place 0.4 mg under the tongue every 5 (five) minutes as needed.  Marland Kitchen omeprazole (PRILOSEC) 20 MG capsule TAKE 1 CAPSULE DAILY  . prasugrel (EFFIENT) 10 MG TABS tablet Take 10  mg by mouth.  . vitamin C (ASCORBIC ACID) 500 MG tablet Take 1,000 mg by mouth daily.  . [DISCONTINUED] trimethoprim-polymyxin b (POLYTRIM) ophthalmic solution Place 2 drops into the left eye every 6 (six) hours. X 5-7 days   No facility-administered encounter medications on file as of 11/18/2016.           Objective:   Physical Exam  Constitutional: He is oriented to person, place, and time. He appears well-developed and well-nourished.  HENT:  Head: Normocephalic and atraumatic.  Right Ear: External ear normal.  Left Ear: External ear normal.  Nose: Nose normal.  Mouth/Throat: Oropharynx is clear and moist.  TMs and canals are clear.   Eyes: Conjunctivae and EOM are normal. Pupils are equal, round, and reactive to light.  Neck: Neck supple. No thyromegaly present.  Cardiovascular: Normal rate and normal heart sounds.   Pulmonary/Chest: Effort  normal and breath sounds normal.  Lymphadenopathy:    He has no cervical adenopathy.  Neurological: He is alert and oriented to person, place, and time. He displays normal reflexes. No cranial nerve deficit. He exhibits normal muscle tone. Coordination normal.  Positive Dix-Hallpike maneuver to the left.  Skin: Skin is warm and dry.  Psychiatric: He has a normal mood and affect.          Assessment & Plan:  Benign positional vertigo-gave reassurance. We called and got him in for physical therapy. Explained that typically he only may need 1 or 2 sessions of treatment for the vertigo. He can certainly use non-drowsy during the migraine which is meclizine as needed but certainly doesn't need to use it can cause sedation and dry mouth. If not improving over the next 2-3 weeks and please let me know.

## 2016-11-19 NOTE — Therapy (Signed)
[facility] [address]  [place], [place], [address] Phone: [phone]   Fax:  [phone]  Physical Therapy Evaluation  Patient Details  Name: Joshua Warner MRN: [id number] Date of Birth: 07-29-1950 Referring Provider: Dr. Beatrice Lecher  Encounter Date: 11/18/2016      PT End of Session - 11/18/16 1753    Visit Number 1   Number of Visits 8   Date for PT Re-Evaluation 12/16/16   PT Start Time 3716   PT Stop Time 1736   PT Time Calculation (min) 42 min   Activity Tolerance Patient tolerated treatment well   Behavior During Therapy [facility] for tasks assessed/performed      Past Medical History:  Diagnosis Date  . Hypertension since 2007  . Kidney stones 2009    Past Surgical History:  Procedure Laterality Date  . KNEE ARTHROSCOPY  12/31/2010   Right knee   . TONSILLECTOMY  1957    There were no vitals filed for this visit.       Subjective Assessment - 11/18/16 1657    Subjective Reports Vertigo "off and on" for 18 years. On Sunday, returned from [place], while showering with head tilted back had an increase in dizziness. Most symptoms resolved quickly, however, dizziness has been present all week. Has taken a meclazine earlier today. Went to MD today due to the dizziness - diagnosed with positional vertigo - did not take patient through correction maneuver. Slight stiffness noted in neck -does go to chiropractor.    Patient Stated Goals improve symptoms   Currently in Pain? No/denies   Multiple Pain Sites No            [facility] PT Assessment - 11/18/16 1649      Assessment   Medical Diagnosis Vertigo   Referring Provider Dr. Beatrice Lecher   Onset Date/Surgical Date --  flare up in symtpoms on Sunday 11/14/16 after long drive   Next MD Visit prn   Prior Therapy no     Precautions   Precautions None     Restrictions   Weight Bearing Restrictions No     Balance Screen   Has the  patient fallen in the past 6 months No   Has the patient had a decrease in activity level because of a fear of falling?  No   Is the patient reluctant to leave their home because of a fear of falling?  No     Home Ecologist residence   Living Arrangements Spouse/significant other     Prior Function   Level of Independence Independent   Vocation Part time employment   Vocation Requirements "resets" shelves within [place]   Overall Cognitive Status Within Functional Limits for tasks assessed     Observation/Other Assessments   Focus on Therapeutic Outcomes (FOTO)  Vertigo: 73 (27% limited, predicted 9% limited)            Vestibular Assessment - 11/18/16 1704      Vestibular Assessment   General Observation does not currently have any symtpoms at rest; reports a "hangover" type feeling from the dizziness     Symptom Behavior   Type of Dizziness Lightheadedness  "seems to pull me to the left"   Frequency of Dizziness "off and on for 18 years"   Duration of Dizziness < 30 seconds   Aggravating Factors Lying supine;Looking up to the ceiling   Relieving Factors  Medication;Head stationary     Occulomotor Exam   Occulomotor Alignment Normal   Spontaneous Absent   Head shaking Horizontal Absent   Smooth Pursuits Intact   Saccades Intact     Vestibulo-Occular Reflex   VOR 1 Head Only (x 1 viewing) given as part of HEP - both horizontal and vertical with no symptoms reproduced and no nystagmus noted     Positional Testing   Dix-Hallpike Dix-Hallpike Right;Dix-Hallpike Left     Dix-Hallpike Right   Dix-Hallpike Right Duration 30-45 seconds   Dix-Hallpike Right Symptoms No nystagmus     Dix-Hallpike Left   Dix-Hallpike Left Duration 30-45 seconds   Dix-Hallpike Left Symptoms No nystagmus     Cognition   Cognition Orientation Level Oriented x 4     Positional Sensitivities   Right Hallpike No dizziness   Up from  Right Hallpike No dizziness   Up from Left Hallpike No dizziness                       PT Education - 11/18/16 1752    Education provided Yes   Education Details exam findings, POC, HEP   Person(s) Educated Patient   Methods Explanation;Demonstration;Handout   Comprehension Verbalized understanding;Returned demonstration;Need further instruction             PT Long Term Goals - 11/18/16 1755      PT LONG TERM GOAL #1   Title Patient to be independent with HEP for habituation, adaptation and canalith maneuvers as needed (12/16/16)   Status New     PT LONG TERM GOAL #2   Title Patient to demonstrate negative Dix-Hallpike for greater than 2 weeks (12/16/16)   Status New     PT LONG TERM GOAL #3   Title Patient to report ability to shower, lie supine, as well as perform job duties without dizziness limiting (12/16/16)   Status New     PT LONG TERM GOAL #4   Title Patient to report dizziness </= 1/10 for greater than 2 weeks (12/16/16)   Status New               Plan - 11/18/16 1753    Clinical Impression Statement Patient is a 66 y/o male presenting to [facility] today for initial evaluation of chief complaint of dizziness. Patient seeing MD earlier today with reported diagnosis of positional vertigo. Vestibular exam today revealing smooth pursuit, saccades and head shaking all within normal limits with no extra eye movements, lagging, or nystagmus. Patient then taken through both L and R Dix-Hallpike with no onset of dizziness or nystagmus. Patient also performing dizziness provoking activities, such as tilting head back, with no onest. Patient shown maneuver for self correction at home, however not taken through due to negative findings. Will plan to follow up with patient and re-test is neccessary to ensure resolution of symptoms.    Rehab Potential Good   PT Frequency 2x / week   PT Duration 4 weeks   PT Treatment/Interventions ADLs/Self Care Home  Management;Canalith Repostioning;Cryotherapy;Electrical Stimulation;Moist Heat;Traction;Ultrasound;Neuromuscular re-education;Balance training;Therapeutic exercise;Therapeutic activities;Patient/family education;Manual techniques;Passive range of motion;Vestibular;Vasopneumatic Device;Taping;Dry needling   Consulted and Agree with Plan of Care Patient      Patient will benefit from skilled therapeutic intervention in order to improve the following deficits and impairments:  Dizziness, Decreased activity tolerance, Impaired vision/preception  Visit Diagnosis: Dizziness and giddiness - Plan: PT plan of care cert/re-cert     Problem List Patient Active Problem List   Diagnosis  Date Noted  . Coronary artery disease involving native coronary artery of native heart without angina pectoris 01/03/2016  . Status post insertion of drug eluting coronary artery stent 01/03/2016  . STEMI (ST elevation myocardial infarction) ([place]) 01/03/2016  . Subscapularis tendinitis of left shoulder 10/25/2014  . Hyperlipidemia 08/12/2014  . Obesity (BMI 30-39.9) 03/22/2014  . Extensor intersection syndrome of both wrists 08/02/2013  . Allergic reaction 02/08/2013  . Baker's cyst, left 01/11/2013  . Solitary pulmonary nodule 11/14/2012  . Arthritis of carpometacarpal joint 10/17/2012  . HYPOGONADISM 06/04/2009  . HYPERTENSION, BENIGN 06/04/2009  . GERD 06/04/2009  . ERECTILE DYSFUNCTION, ORGANIC 06/04/2009     Lanney Gins, PT, DPT 11/19/16 8:07 AM   [facility] [address]  [address], [place], [address] Phone: [phone]   Fax:  [phone]  Name: Joshua Warner MRN: [id number] Date of Birth: 07/15/1950

## 2016-12-01 ENCOUNTER — Ambulatory Visit: Payer: BLUE CROSS/BLUE SHIELD | Attending: Family Medicine | Admitting: Physical Therapy

## 2016-12-01 DIAGNOSIS — R42 Dizziness and giddiness: Secondary | ICD-10-CM | POA: Diagnosis present

## 2016-12-01 NOTE — Therapy (Signed)
[facility] [address]  [place], [place], [address] Phone: [phone]   Fax:  [phone]  Physical Therapy Treatment  Patient Details  Name: Joshua Warner MRN: [id number] Date of Birth: 17-Sep-1950 Referring Provider: Dr. Beatrice Lecher  Encounter Date: 12/01/2016      PT End of Session - 12/01/16 1725    Visit Number 2   Number of Visits 8   Date for PT Re-Evaluation 12/16/16   PT Start Time 1652   PT Stop Time 1720   PT Time Calculation (min) 28 min   Activity Tolerance Patient tolerated treatment well   Behavior During Therapy [facility] for tasks assessed/performed      Past Medical History:  Diagnosis Date  . Hypertension since 2007  . Kidney stones 2009    Past Surgical History:  Procedure Laterality Date  . KNEE ARTHROSCOPY  12/31/2010   Right knee   . TONSILLECTOMY  1957    There were no vitals filed for this visit.      Subjective Assessment - 12/01/16 1721    Subjective has had no dizziness symptoms, no longer taking meclazine   Patient Stated Goals improve symptoms   Currently in Pain? No/denies   Multiple Pain Sites No                Vestibular Assessment - 12/01/16 0001      Vestibular Assessment   General Observation no symptoms     Occulomotor Exam   Smooth Pursuits Intact   Saccades Intact     Vestibulo-Occular Reflex   VOR Cancellation Normal     Positional Testing   Dix-Hallpike Dix-Hallpike Right;Dix-Hallpike Left     Dix-Hallpike Right   Dix-Hallpike Right Symptoms No nystagmus     Dix-Hallpike Left   Dix-Hallpike Left Symptoms No nystagmus     Cognition   Cognition Orientation Level Oriented x 4     Positional Sensitivities   Sit to Supine No dizziness   Supine to Sitting No dizziness   Right Hallpike No dizziness   Up from Right Hallpike No dizziness   Up from Left Hallpike No dizziness   Head Turning x 5 No dizziness                  Vestibular Treatment/Exercise - 12/01/16 0001      Vestibular Treatment/Exercise   Vestibular Treatment Provided Gaze   Gaze Exercises X1 Viewing Horizontal     X1 Viewing Horizontal   Foot Position seated, standing   Time --  30 seconds   Reps --  3 reps each position   Comments no symptoms                    PT Long Term Goals - 12/01/16 1726      PT LONG TERM GOAL #1   Title Patient to be independent with HEP for habituation, adaptation and canalith maneuvers as needed (12/16/16)   Status Achieved     PT LONG TERM GOAL #2   Title Patient to demonstrate negative Dix-Hallpike for greater than 2 weeks (12/16/16)   Status Achieved     PT LONG TERM GOAL #3   Title Patient to report ability to shower, lie supine, as well as perform job duties without dizziness limiting (12/16/16)   Status Achieved     PT LONG TERM GOAL #4   Title Patient to report dizziness </= 1/10 for greater than 2 weeks (12/16/16)  Status Achieved               Plan - 12/01/16 1727    Clinical Impression Statement Patient presenting to PT today with no symptoms since last visit. Patient no longer having dizzines with showering, or with head turns. Has been compliant with gaze stabilization exercises noting improved symptoms with this. Patient re-assessed today with all findings within normal limits with no onset of dizziness with Dix-Hallpike or positional changes. Patient to be d/c from PT care at this time with understanding he may return for any other needs.    PT Treatment/Interventions ADLs/Self Care Home Management;Canalith Repostioning;Cryotherapy;Electrical Stimulation;Moist Heat;Traction;Ultrasound;Neuromuscular re-education;Balance training;Therapeutic exercise;Therapeutic activities;Patient/family education;Manual techniques;Passive range of motion;Vestibular;Vasopneumatic Device;Taping;Dry needling   Consulted and Agree with Plan of Care Patient      Patient will benefit  from skilled therapeutic intervention in order to improve the following deficits and impairments:  Dizziness, Decreased activity tolerance, Impaired vision/preception  Visit Diagnosis: Dizziness and giddiness     Problem List Patient Active Problem List   Diagnosis Date Noted  . Coronary artery disease involving native coronary artery of native heart without angina pectoris 01/03/2016  . Status post insertion of drug eluting coronary artery stent 01/03/2016  . STEMI (ST elevation myocardial infarction) ([place]) 01/03/2016  . Subscapularis tendinitis of left shoulder 10/25/2014  . Hyperlipidemia 08/12/2014  . Obesity (BMI 30-39.9) 03/22/2014  . Extensor intersection syndrome of both wrists 08/02/2013  . Allergic reaction 02/08/2013  . Baker's cyst, left 01/11/2013  . Solitary pulmonary nodule 11/14/2012  . Arthritis of carpometacarpal joint 10/17/2012  . HYPOGONADISM 06/04/2009  . HYPERTENSION, BENIGN 06/04/2009  . GERD 06/04/2009  . ERECTILE DYSFUNCTION, ORGANIC 06/04/2009    Lanney Gins, PT, DPT 12/01/16 5:29 PM  PHYSICAL THERAPY DISCHARGE SUMMARY  Visits from Start of Care: 2  Current functional level related to goals / functional outcomes: See above, resolution of symptoms   Remaining deficits: none   Education / Equipment: HEP  Plan: Patient agrees to discharge.  Patient goals were met. Patient is being discharged due to meeting the stated rehab goals.  ?????    Lanney Gins, PT, DPT 12/01/16 5:29 PM   [facility] [address]  [place], [place], [address] Phone: [phone]   Fax:  [phone]  Name: Joshua Warner MRN: [id number] Date of Birth: March 08, 1951

## 2016-12-10 ENCOUNTER — Encounter: Payer: Self-pay | Admitting: Sports Medicine

## 2016-12-10 ENCOUNTER — Ambulatory Visit (INDEPENDENT_AMBULATORY_CARE_PROVIDER_SITE_OTHER): Payer: BLUE CROSS/BLUE SHIELD | Admitting: Sports Medicine

## 2016-12-10 DIAGNOSIS — M19049 Primary osteoarthritis, unspecified hand: Secondary | ICD-10-CM | POA: Diagnosis not present

## 2016-12-10 DIAGNOSIS — M19042 Primary osteoarthritis, left hand: Secondary | ICD-10-CM | POA: Diagnosis not present

## 2016-12-10 NOTE — Progress Notes (Signed)
   Subjective:    I'm seeing this patient as a consultation for:  Dr. Beatrice Lecher  CC: Left hand pain  HPI: For months this pleasant 66 year old male has had pain that he localizes in the left third knuckle, moderate, persistent with gelling, localized without radiation. Desires interventional treatment.  Right trapeziometacarpal joint arthritis: Has touched base with hand surgery, thumb suspension scheduled or at least tentatively planned for September, desires repeat injection today.  Past medical history:  Negative.  See flowsheet/record as well for more information.  Surgical history: Negative.  See flowsheet/record as well for more information.  Family history: Negative.  See flowsheet/record as well for more information.  Social history: Negative.  See flowsheet/record as well for more information.  Allergies, and medications have been entered into the medical record, reviewed, and no changes needed.   Review of Systems: No headache, visual changes, nausea, vomiting, diarrhea, constipation, dizziness, abdominal pain, skin rash, fevers, chills, night sweats, weight loss, swollen lymph nodes, body aches, joint swelling, muscle aches, chest pain, shortness of breath, mood changes, visual or auditory hallucinations.   Objective:   General: Well Developed, well nourished, and in no acute distress.  Neuro/Psych: Alert and oriented x3, extra-ocular muscles intact, able to move all 4 extremities, sensation grossly intact. Skin: Warm and dry, no rashes noted.  Respiratory: Not using accessory muscles, speaking in full sentences, trachea midline.  Cardiovascular: Pulses palpable, no extremity edema. Abdomen: Does not appear distended. Left hand: Tender to palpation with swelling at the third metacarpophalangeal joint.  Procedure: Real-time Ultrasound Guided Injection of left third MCP Device: GE Logiq E  Verbal informed consent obtained.  Time-out conducted.  Noted no overlying  erythema, induration, or other signs of local infection.  Skin prepped in a sterile fashion.  Local anesthesia: Topical Ethyl chloride.  With sterile technique and under real time ultrasound guidance:  1/2 mL kenalog 40, 1/2 mL lidocaine injected easily Completed without difficulty  Pain immediately resolved suggesting accurate placement of the medication.  Advised to call if fevers/chills, erythema, induration, drainage, or persistent bleeding.  Images permanently stored and available for review in the ultrasound unit.  Impression: Technically successful ultrasound guided injection.  Procedure: Real-time Ultrasound Guided Injection of right trapeziometacarpal joint Device: GE Logiq E  Verbal informed consent obtained.  Time-out conducted.  Noted no overlying erythema, induration, or other signs of local infection.  Skin prepped in a sterile fashion.  Local anesthesia: Topical Ethyl chloride.  With sterile technique and under real time ultrasound guidance:  1/2 mL kenalog 40, 1/2 mL lidocaine injected easily Completed without difficulty  Pain immediately resolved suggesting accurate placement of the medication.  Advised to call if fevers/chills, erythema, induration, drainage, or persistent bleeding.  Images permanently stored and available for review in the ultrasound unit.  Impression: Technically successful ultrasound guided injection.  Impression and Recommendations:   This case required medical decision making of moderate complexity.  Arthritis of carpometacarpal joint Final injection into the right trapeziometacarpal joint, he did touch base with hand surgery and thumb suspension is scheduled for September.  Primary osteoarthritis of left hand Discrete pain at the left third MCP, this was injected today. Return in one month to check this out again.

## 2016-12-10 NOTE — Assessment & Plan Note (Signed)
Discrete pain at the left third MCP, this was injected today. Return in one month to check this out again.

## 2016-12-10 NOTE — Assessment & Plan Note (Signed)
Final injection into the right trapeziometacarpal joint, he did touch base with hand surgery and thumb suspension is scheduled for September.

## 2016-12-17 ENCOUNTER — Encounter: Payer: Self-pay | Admitting: Family Medicine

## 2016-12-31 DIAGNOSIS — M25512 Pain in left shoulder: Secondary | ICD-10-CM | POA: Diagnosis not present

## 2016-12-31 DIAGNOSIS — M9901 Segmental and somatic dysfunction of cervical region: Secondary | ICD-10-CM | POA: Diagnosis not present

## 2016-12-31 DIAGNOSIS — M545 Low back pain: Secondary | ICD-10-CM | POA: Diagnosis not present

## 2016-12-31 DIAGNOSIS — M62830 Muscle spasm of back: Secondary | ICD-10-CM | POA: Diagnosis not present

## 2016-12-31 DIAGNOSIS — M542 Cervicalgia: Secondary | ICD-10-CM | POA: Diagnosis not present

## 2016-12-31 DIAGNOSIS — M9903 Segmental and somatic dysfunction of lumbar region: Secondary | ICD-10-CM | POA: Diagnosis not present

## 2016-12-31 DIAGNOSIS — M25511 Pain in right shoulder: Secondary | ICD-10-CM | POA: Diagnosis not present

## 2017-01-07 ENCOUNTER — Ambulatory Visit (INDEPENDENT_AMBULATORY_CARE_PROVIDER_SITE_OTHER): Payer: Medicare Other | Admitting: Sports Medicine

## 2017-01-07 ENCOUNTER — Encounter: Payer: Self-pay | Admitting: Sports Medicine

## 2017-01-07 DIAGNOSIS — M19042 Primary osteoarthritis, left hand: Secondary | ICD-10-CM | POA: Diagnosis not present

## 2017-01-07 DIAGNOSIS — M7122 Synovial cyst of popliteal space [Baker], left knee: Secondary | ICD-10-CM | POA: Diagnosis not present

## 2017-01-07 NOTE — Progress Notes (Signed)
  Subjective:    CC: Follow-up  HPI: Left knee Baker's cyst: Previous aspiration was a year ago, desires repeat today.  Right hand osteoarthritis: Trapeziometacarpal joint, injected the last visit, doing well but he does have an appointment for consultation for thumb suspension.  Left hand osteoarthritis: Third MCP injection was successful, no pain.  Past medical history:  Negative.  See flowsheet/record as well for more information.  Surgical history: Negative.  See flowsheet/record as well for more information.  Family history: Negative.  See flowsheet/record as well for more information.  Social history: Negative.  See flowsheet/record as well for more information.  Allergies, and medications have been entered into the medical record, reviewed, and no changes needed.   Review of Systems: No fevers, chills, night sweats, weight loss, chest pain, or shortness of breath.   Objective:    General: Well Developed, well nourished, and in no acute distress.  Neuro: Alert and oriented x3, extra-ocular muscles intact, sensation grossly intact.  HEENT: Normocephalic, atraumatic, pupils equal round reactive to light, neck supple, no masses, no lymphadenopathy, thyroid nonpalpable.  Skin: Warm and dry, no rashes. Cardiac: Regular rate and rhythm, no murmurs rubs or gallops, no lower extremity edema.  Respiratory: Clear to auscultation bilaterally. Not using accessory muscles, speaking in full sentences.  Procedure: Real-time Ultrasound Guided aspiration/injection of left knee Baker's cyst Device: GE Logiq E  Verbal informed consent obtained.  Time-out conducted.  Noted no overlying erythema, induration, or other signs of local infection.  Skin prepped in a sterile fashion.  Local anesthesia: Topical Ethyl chloride.  With sterile technique and under real time ultrasound guidance:  Aspirated 50 mL straw-colored fluid, syringe switched and 1 mL kenalog 40, 1 mL lidocaine injected  easily Completed without difficulty  Pain immediately resolved suggesting accurate placement of the medication.  Advised to call if fevers/chills, erythema, induration, drainage, or persistent bleeding.  Images permanently stored and available for review in the ultrasound unit.  Impression: Technically successful ultrasound guided injection.  The knee was then strapped with compressive dressing.  Impression and Recommendations:    Baker's cyst, left Previous aspiration was a year and 2 months ago, repeat aspiration and injection as above. Return as needed for this.  Primary osteoarthritis of left hand Left third MCP is now pain-free. Return as needed.

## 2017-01-07 NOTE — Assessment & Plan Note (Signed)
Left third MCP is now pain-free. Return as needed.

## 2017-01-07 NOTE — Assessment & Plan Note (Signed)
Previous aspiration was a year and 2 months ago, repeat aspiration and injection as above. Return as needed for this.

## 2017-01-26 DIAGNOSIS — Z955 Presence of coronary angioplasty implant and graft: Secondary | ICD-10-CM | POA: Diagnosis not present

## 2017-01-26 DIAGNOSIS — I10 Essential (primary) hypertension: Secondary | ICD-10-CM | POA: Diagnosis not present

## 2017-01-26 DIAGNOSIS — I2510 Atherosclerotic heart disease of native coronary artery without angina pectoris: Secondary | ICD-10-CM | POA: Diagnosis not present

## 2017-01-27 ENCOUNTER — Other Ambulatory Visit: Payer: Self-pay

## 2017-01-27 DIAGNOSIS — K219 Gastro-esophageal reflux disease without esophagitis: Secondary | ICD-10-CM

## 2017-01-27 MED ORDER — OMEPRAZOLE 20 MG PO CPDR
20.0000 mg | DELAYED_RELEASE_CAPSULE | Freq: Every day | ORAL | 0 refills | Status: DC
Start: 2017-01-27 — End: 2017-07-08

## 2017-02-18 DIAGNOSIS — M25511 Pain in right shoulder: Secondary | ICD-10-CM | POA: Diagnosis not present

## 2017-02-18 DIAGNOSIS — M545 Low back pain: Secondary | ICD-10-CM | POA: Diagnosis not present

## 2017-02-18 DIAGNOSIS — M62830 Muscle spasm of back: Secondary | ICD-10-CM | POA: Diagnosis not present

## 2017-02-18 DIAGNOSIS — M542 Cervicalgia: Secondary | ICD-10-CM | POA: Diagnosis not present

## 2017-02-18 DIAGNOSIS — M9901 Segmental and somatic dysfunction of cervical region: Secondary | ICD-10-CM | POA: Diagnosis not present

## 2017-02-18 DIAGNOSIS — M9903 Segmental and somatic dysfunction of lumbar region: Secondary | ICD-10-CM | POA: Diagnosis not present

## 2017-02-18 DIAGNOSIS — M25512 Pain in left shoulder: Secondary | ICD-10-CM | POA: Diagnosis not present

## 2017-03-04 ENCOUNTER — Ambulatory Visit: Payer: BLUE CROSS/BLUE SHIELD | Admitting: Family Medicine

## 2017-03-04 ENCOUNTER — Encounter: Payer: Self-pay | Admitting: Family Medicine

## 2017-03-04 ENCOUNTER — Ambulatory Visit (INDEPENDENT_AMBULATORY_CARE_PROVIDER_SITE_OTHER): Payer: Medicare Other | Admitting: Family Medicine

## 2017-03-04 VITALS — BP 111/72 | HR 67 | Wt 196.0 lb

## 2017-03-04 DIAGNOSIS — N529 Male erectile dysfunction, unspecified: Secondary | ICD-10-CM | POA: Diagnosis not present

## 2017-03-04 DIAGNOSIS — I2510 Atherosclerotic heart disease of native coronary artery without angina pectoris: Secondary | ICD-10-CM | POA: Diagnosis not present

## 2017-03-04 DIAGNOSIS — I10 Essential (primary) hypertension: Secondary | ICD-10-CM | POA: Diagnosis not present

## 2017-03-04 DIAGNOSIS — Z23 Encounter for immunization: Secondary | ICD-10-CM | POA: Diagnosis not present

## 2017-03-04 NOTE — Progress Notes (Signed)
Subjective:    CC: HTN   HPI:  Hypertension- Pt denies chest pain, SOB, dizziness, or heart palpitations.  Taking meds as directed w/o problems.  Denies medication side effects.    CAD- no recent chest pain or shortness of breath. His Effient was discontinued August 1. He's been doing well. Prior history of ST elevation M  ERECTILE DYSFUNCTION_HE WANTED TO KNOW IF THERE WAS A GENU GENERIC AVAILABLE FOR Cialis. He tried Viagra years ago and did not get a good response to try the low-dose daily Cialis back in 2010 and did greah it.  Past medical history, Surgical history, Family history not pertinant except as noted below, Social history, Allergies, and medications have been entered into the medical record, reviewed, and corrections made.   Review of Systems: No fevers, chills, night sweats, weight loss, chest pain, or shortness of breath.   Objective:    General: Well Developed, well nourished, and in no acute distress.  Neuro: Alert and oriented x3, extra-ocular muscles intact, sensation grossly intact.  HEENT: Normocephalic, atraumatic  Skin: Warm and dry, no rashes. Cardiac: Regular rate and rhythm, no murmurs rubs or gallops, no lower extremity edema.  Respiratory: Clear to auscultation bilaterally. Not using accessory muscles, speaking in full sentences.   Impression and Recommendations:    HTN - Well controlled. Continue current regimen. Follow up in  6 months. Due for BMP.   CAD- Stable. No new sxs.  Off the Effient now.  Came off 8/1.    ED  - Explained that there is no generic available. Certainly could check with his insurance to see what, covered she may have. The only generic option would be lose dose sildenafil.

## 2017-03-05 LAB — BASIC METABOLIC PANEL WITH GFR
BUN: 15 mg/dL (ref 7–25)
CO2: 26 mmol/L (ref 20–32)
Calcium: 9.6 mg/dL (ref 8.6–10.3)
Chloride: 105 mmol/L (ref 98–110)
Creat: 1.01 mg/dL (ref 0.70–1.25)
GFR, Est African American: 90 mL/min/{1.73_m2} (ref >=60)
GFR, Est Non African American: 78 mL/min/{1.73_m2} (ref >=60)
Glucose, Bld: 85 mg/dL (ref 65–99)
Potassium: 4.4 mmol/L (ref 3.5–5.3)
Sodium: 139 mmol/L (ref 135–146)

## 2017-03-07 NOTE — Progress Notes (Signed)
All labs are normal. 

## 2017-03-11 DIAGNOSIS — M542 Cervicalgia: Secondary | ICD-10-CM | POA: Diagnosis not present

## 2017-03-11 DIAGNOSIS — M62830 Muscle spasm of back: Secondary | ICD-10-CM | POA: Diagnosis not present

## 2017-03-11 DIAGNOSIS — M25512 Pain in left shoulder: Secondary | ICD-10-CM | POA: Diagnosis not present

## 2017-03-11 DIAGNOSIS — M9903 Segmental and somatic dysfunction of lumbar region: Secondary | ICD-10-CM | POA: Diagnosis not present

## 2017-03-11 DIAGNOSIS — M545 Low back pain: Secondary | ICD-10-CM | POA: Diagnosis not present

## 2017-03-11 DIAGNOSIS — M9901 Segmental and somatic dysfunction of cervical region: Secondary | ICD-10-CM | POA: Diagnosis not present

## 2017-03-11 DIAGNOSIS — M25511 Pain in right shoulder: Secondary | ICD-10-CM | POA: Diagnosis not present

## 2017-03-12 ENCOUNTER — Encounter: Payer: Self-pay | Admitting: Family Medicine

## 2017-03-14 MED ORDER — SILDENAFIL CITRATE 20 MG PO TABS: 60.0000 mg | ORAL_TABLET | Freq: Every day | ORAL | 5 refills | Status: DC | PRN

## 2017-03-15 ENCOUNTER — Telehealth: Payer: Self-pay | Admitting: *Deleted

## 2017-03-15 NOTE — Telephone Encounter (Signed)
Pre Authorization sent to cover my meds. [id number]

## 2017-04-06 DIAGNOSIS — M25551 Pain in right hip: Secondary | ICD-10-CM | POA: Diagnosis not present

## 2017-04-06 DIAGNOSIS — M545 Low back pain: Secondary | ICD-10-CM | POA: Diagnosis not present

## 2017-04-06 DIAGNOSIS — M25552 Pain in left hip: Secondary | ICD-10-CM | POA: Diagnosis not present

## 2017-04-06 DIAGNOSIS — M9901 Segmental and somatic dysfunction of cervical region: Secondary | ICD-10-CM | POA: Diagnosis not present

## 2017-04-06 DIAGNOSIS — M9903 Segmental and somatic dysfunction of lumbar region: Secondary | ICD-10-CM | POA: Diagnosis not present

## 2017-04-06 DIAGNOSIS — M542 Cervicalgia: Secondary | ICD-10-CM | POA: Diagnosis not present

## 2017-04-06 DIAGNOSIS — M62830 Muscle spasm of back: Secondary | ICD-10-CM | POA: Diagnosis not present

## 2017-04-15 DIAGNOSIS — M62830 Muscle spasm of back: Secondary | ICD-10-CM | POA: Diagnosis not present

## 2017-04-15 DIAGNOSIS — M25552 Pain in left hip: Secondary | ICD-10-CM | POA: Diagnosis not present

## 2017-04-15 DIAGNOSIS — M9901 Segmental and somatic dysfunction of cervical region: Secondary | ICD-10-CM | POA: Diagnosis not present

## 2017-04-15 DIAGNOSIS — M9903 Segmental and somatic dysfunction of lumbar region: Secondary | ICD-10-CM | POA: Diagnosis not present

## 2017-04-15 DIAGNOSIS — M542 Cervicalgia: Secondary | ICD-10-CM | POA: Diagnosis not present

## 2017-04-15 DIAGNOSIS — M545 Low back pain: Secondary | ICD-10-CM | POA: Diagnosis not present

## 2017-04-15 DIAGNOSIS — M25551 Pain in right hip: Secondary | ICD-10-CM | POA: Diagnosis not present

## 2017-04-20 ENCOUNTER — Other Ambulatory Visit: Payer: Self-pay | Admitting: *Deleted

## 2017-04-20 DIAGNOSIS — I10 Essential (primary) hypertension: Secondary | ICD-10-CM

## 2017-04-20 MED ORDER — LOSARTAN POTASSIUM 100 MG PO TABS: 100.0000 mg | ORAL_TABLET | Freq: Every day | ORAL | 2 refills | Status: DC

## 2017-05-13 DIAGNOSIS — M25552 Pain in left hip: Secondary | ICD-10-CM | POA: Diagnosis not present

## 2017-05-13 DIAGNOSIS — M542 Cervicalgia: Secondary | ICD-10-CM | POA: Diagnosis not present

## 2017-05-13 DIAGNOSIS — M9901 Segmental and somatic dysfunction of cervical region: Secondary | ICD-10-CM | POA: Diagnosis not present

## 2017-05-13 DIAGNOSIS — M545 Low back pain: Secondary | ICD-10-CM | POA: Diagnosis not present

## 2017-05-13 DIAGNOSIS — M62830 Muscle spasm of back: Secondary | ICD-10-CM | POA: Diagnosis not present

## 2017-05-13 DIAGNOSIS — M25571 Pain in right ankle and joints of right foot: Secondary | ICD-10-CM | POA: Diagnosis not present

## 2017-05-13 DIAGNOSIS — M9903 Segmental and somatic dysfunction of lumbar region: Secondary | ICD-10-CM | POA: Diagnosis not present

## 2017-05-13 DIAGNOSIS — M25551 Pain in right hip: Secondary | ICD-10-CM | POA: Diagnosis not present

## 2017-05-24 ENCOUNTER — Encounter: Payer: Self-pay | Admitting: Sports Medicine

## 2017-05-24 ENCOUNTER — Ambulatory Visit (INDEPENDENT_AMBULATORY_CARE_PROVIDER_SITE_OTHER): Payer: Medicare Other | Admitting: Sports Medicine

## 2017-05-24 DIAGNOSIS — M19071 Primary osteoarthritis, right ankle and foot: Secondary | ICD-10-CM | POA: Insufficient documentation

## 2017-05-24 NOTE — Assessment & Plan Note (Signed)
Custom orthotics and right first MTP injection as above. He does have significant deviation of his lesser toes, I would like a second opinion from orthopedic foot and ankle surgery.

## 2017-05-24 NOTE — Progress Notes (Signed)
    Patient was fitted for a : standard, cushioned, semi-rigid orthotic. The orthotic was heated and afterward the patient stood on the orthotic blank positioned on the orthotic stand. The patient was positioned in subtalar neutral position and 10 degrees of ankle dorsiflexion in a weight bearing stance. After completion of molding, a stable base was applied to the orthotic blank. The blank was ground to a stable position for weight bearing. Size: 10 Base: [place] Health and safety inspector and Padding: None The patient ambulated these, and they were very comfortable.  Procedure: Real-time Ultrasound Guided Injection of right first MTP Device: GE Logiq E  Verbal informed consent obtained.  Time-out conducted.  Noted no overlying erythema, induration, or other signs of local infection.  Skin prepped in a sterile fashion.  Local anesthesia: Topical Ethyl chloride.  With sterile technique and under real time ultrasound guidance: 1/2 cc Kenalog 40, 1/2 cc lidocaine injected easily Completed without difficulty  Pain immediately resolved suggesting accurate placement of the medication.  Advised to call if fevers/chills, erythema, induration, drainage, or persistent bleeding.  Images permanently stored and available for review in the ultrasound unit.  Impression: Technically successful ultrasound guided injection.  I spent 40 minutes with this patient, greater than 50% was face-to-face time counseling regarding the below diagnosis, this was separate from the time spent performing the above procedure.  ___________________________________________ Gwen Her. Dianah Field, M.D., ABFM., CAQSM. Primary Care and [place] Instructor of [place] of [facility]

## 2017-06-03 DIAGNOSIS — M79671 Pain in right foot: Secondary | ICD-10-CM | POA: Diagnosis not present

## 2017-06-03 DIAGNOSIS — M21611 Bunion of right foot: Secondary | ICD-10-CM | POA: Diagnosis not present

## 2017-06-03 DIAGNOSIS — M21612 Bunion of left foot: Secondary | ICD-10-CM | POA: Diagnosis not present

## 2017-06-03 DIAGNOSIS — M79672 Pain in left foot: Secondary | ICD-10-CM | POA: Diagnosis not present

## 2017-06-10 DIAGNOSIS — M545 Low back pain: Secondary | ICD-10-CM | POA: Diagnosis not present

## 2017-06-10 DIAGNOSIS — M25571 Pain in right ankle and joints of right foot: Secondary | ICD-10-CM | POA: Diagnosis not present

## 2017-06-10 DIAGNOSIS — M9903 Segmental and somatic dysfunction of lumbar region: Secondary | ICD-10-CM | POA: Diagnosis not present

## 2017-06-10 DIAGNOSIS — M25552 Pain in left hip: Secondary | ICD-10-CM | POA: Diagnosis not present

## 2017-06-10 DIAGNOSIS — M25551 Pain in right hip: Secondary | ICD-10-CM | POA: Diagnosis not present

## 2017-06-10 DIAGNOSIS — M62830 Muscle spasm of back: Secondary | ICD-10-CM | POA: Diagnosis not present

## 2017-06-10 DIAGNOSIS — M542 Cervicalgia: Secondary | ICD-10-CM | POA: Diagnosis not present

## 2017-06-10 DIAGNOSIS — M9901 Segmental and somatic dysfunction of cervical region: Secondary | ICD-10-CM | POA: Diagnosis not present

## 2017-06-15 DIAGNOSIS — M79671 Pain in right foot: Secondary | ICD-10-CM | POA: Diagnosis not present

## 2017-06-15 DIAGNOSIS — M79672 Pain in left foot: Secondary | ICD-10-CM | POA: Diagnosis not present

## 2017-07-04 ENCOUNTER — Ambulatory Visit (INDEPENDENT_AMBULATORY_CARE_PROVIDER_SITE_OTHER): Payer: Medicare Other | Admitting: Sports Medicine

## 2017-07-04 ENCOUNTER — Encounter: Payer: Self-pay | Admitting: Sports Medicine

## 2017-07-04 DIAGNOSIS — M19071 Primary osteoarthritis, right ankle and foot: Secondary | ICD-10-CM | POA: Diagnosis not present

## 2017-07-04 NOTE — Assessment & Plan Note (Signed)
Doing well after custom orthotics and right first MTP injection at the last visit. Foot and ankle surgery was able to provide him with some toe separators to improve the medial deviation of his second toes. Overall he is happy with how things are going and can return on an as-needed basis.

## 2017-07-04 NOTE — Progress Notes (Signed)
  Subjective:    CC: Recheck foot pain  HPI: First MTP pain: Resolved after injection  I reviewed the past medical history, family history, social history, surgical history, and allergies today and no changes were needed.  Please see the problem list section below in epic for further details.  Past Medical History: Past Medical History:  Diagnosis Date  . Hypertension since 2007  . Kidney stones 2009   Past Surgical History: Past Surgical History:  Procedure Laterality Date  . KNEE ARTHROSCOPY  12/31/2010   Right knee   . TONSILLECTOMY  1957   Social History: Social History   Socioeconomic History  . Marital status: Married    Spouse name: None  . Number of children: 2  . Years of education: None  . Highest education level: None  Social Needs  . Financial resource strain: None  . Food insecurity - worry: None  . Food insecurity - inability: None  . Transportation needs - medical: None  . Transportation needs - non-medical: None  Occupational History  . Occupation: Retired    Fish farm manager: [employer]  Tobacco Use  . Smoking status: Never Smoker  . Smokeless tobacco: Never Used  Substance and Sexual Activity  . Alcohol use: Yes    Alcohol/week: 2.0 oz    Types: 4 drink(s) per week  . Drug use: No  . 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  Other Topics Concern  . None  Social History Narrative   No regular exercise. Retired from [employer]   Family History: Family History  Problem Relation Age of Onset  . Hypertension Mother   . Prostate cancer Maternal Uncle        Deceased  . Depression Maternal Uncle   . Cancer Paternal Grandfather   . Cancer Paternal Grandmother   . Heart disease Father    Allergies: Allergies  Allergen Reactions  . Celecoxib     REACTION: rash  . Sulfonamide Derivatives     REACTION:  hives   Medications: See med rec.  Review of Systems: No fevers, chills, night sweats, weight loss, chest pain, or shortness of breath.   Objective:    General: Well Developed, well nourished, and in no acute distress.  Neuro: Alert and oriented x3, extra-ocular muscles intact, sensation grossly intact.  HEENT: Normocephalic, atraumatic, pupils equal round reactive to light, neck supple, no masses, no lymphadenopathy, thyroid nonpalpable.  Skin: Warm and dry, no rashes. Cardiac: Regular rate and rhythm, no murmurs rubs or gallops, no lower extremity edema.  Respiratory: Clear to auscultation bilaterally. Not using accessory muscles, speaking in full sentences.  Impression and Recommendations:    Osteoarthritis of first metatarsophalangeal (MTP) joint of right foot Doing well after custom orthotics and right first MTP injection at the last visit. Foot and ankle surgery was able to provide him with some toe separators to improve the medial deviation of his second toes. Overall he is happy with how things are going and can return on an as-needed basis.  ___________________________________________ Gwen Her. Dianah Field, M.D., ABFM., CAQSM. Primary Care and [place] Instructor of [place] of [facility]

## 2017-07-08 ENCOUNTER — Other Ambulatory Visit: Payer: Self-pay

## 2017-07-08 DIAGNOSIS — M9903 Segmental and somatic dysfunction of lumbar region: Secondary | ICD-10-CM | POA: Diagnosis not present

## 2017-07-08 DIAGNOSIS — M545 Low back pain: Secondary | ICD-10-CM | POA: Diagnosis not present

## 2017-07-08 DIAGNOSIS — M25552 Pain in left hip: Secondary | ICD-10-CM | POA: Diagnosis not present

## 2017-07-08 DIAGNOSIS — M25571 Pain in right ankle and joints of right foot: Secondary | ICD-10-CM | POA: Diagnosis not present

## 2017-07-08 DIAGNOSIS — M542 Cervicalgia: Secondary | ICD-10-CM | POA: Diagnosis not present

## 2017-07-08 DIAGNOSIS — M25551 Pain in right hip: Secondary | ICD-10-CM | POA: Diagnosis not present

## 2017-07-08 DIAGNOSIS — M9901 Segmental and somatic dysfunction of cervical region: Secondary | ICD-10-CM | POA: Diagnosis not present

## 2017-07-08 DIAGNOSIS — K219 Gastro-esophageal reflux disease without esophagitis: Secondary | ICD-10-CM

## 2017-07-08 DIAGNOSIS — M62830 Muscle spasm of back: Secondary | ICD-10-CM | POA: Diagnosis not present

## 2017-07-08 MED ORDER — OMEPRAZOLE 20 MG PO CPDR: 20.0000 mg | DELAYED_RELEASE_CAPSULE | Freq: Every day | ORAL | 1 refills | Status: DC

## 2017-08-05 DIAGNOSIS — M25551 Pain in right hip: Secondary | ICD-10-CM | POA: Diagnosis not present

## 2017-08-05 DIAGNOSIS — M545 Low back pain: Secondary | ICD-10-CM | POA: Diagnosis not present

## 2017-08-05 DIAGNOSIS — M25552 Pain in left hip: Secondary | ICD-10-CM | POA: Diagnosis not present

## 2017-08-05 DIAGNOSIS — M62830 Muscle spasm of back: Secondary | ICD-10-CM | POA: Diagnosis not present

## 2017-08-05 DIAGNOSIS — M9903 Segmental and somatic dysfunction of lumbar region: Secondary | ICD-10-CM | POA: Diagnosis not present

## 2017-08-05 DIAGNOSIS — M542 Cervicalgia: Secondary | ICD-10-CM | POA: Diagnosis not present

## 2017-08-05 DIAGNOSIS — M25571 Pain in right ankle and joints of right foot: Secondary | ICD-10-CM | POA: Diagnosis not present

## 2017-08-05 DIAGNOSIS — M9901 Segmental and somatic dysfunction of cervical region: Secondary | ICD-10-CM | POA: Diagnosis not present

## 2017-08-23 ENCOUNTER — Other Ambulatory Visit: Payer: Self-pay

## 2017-08-23 DIAGNOSIS — Z1329 Encounter for screening for other suspected endocrine disorder: Secondary | ICD-10-CM

## 2017-08-23 DIAGNOSIS — E785 Hyperlipidemia, unspecified: Secondary | ICD-10-CM

## 2017-08-23 DIAGNOSIS — Z0000 Encounter for general adult medical examination without abnormal findings: Secondary | ICD-10-CM

## 2017-08-23 DIAGNOSIS — Z125 Encounter for screening for malignant neoplasm of prostate: Secondary | ICD-10-CM

## 2017-08-26 DIAGNOSIS — Z125 Encounter for screening for malignant neoplasm of prostate: Secondary | ICD-10-CM | POA: Diagnosis not present

## 2017-08-26 DIAGNOSIS — E785 Hyperlipidemia, unspecified: Secondary | ICD-10-CM | POA: Diagnosis not present

## 2017-08-26 DIAGNOSIS — Z0000 Encounter for general adult medical examination without abnormal findings: Secondary | ICD-10-CM | POA: Diagnosis not present

## 2017-08-26 DIAGNOSIS — Z1329 Encounter for screening for other suspected endocrine disorder: Secondary | ICD-10-CM | POA: Diagnosis not present

## 2017-08-27 LAB — LIPID PANEL W/REFLEX DIRECT LDL
CHOLESTEROL: 95 mg/dL (ref <200)
HDL: 46 mg/dL (ref >40)
LDL Cholesterol (Calc): 37 mg/dL (calc)
NON-HDL CHOLESTEROL (CALC): 49 mg/dL (ref <130)
Total CHOL/HDL Ratio: 2.1 (calc) (ref <5.0)
Triglycerides: 50 mg/dL (ref <150)

## 2017-08-27 LAB — COMPLETE METABOLIC PANEL WITH GFR
AG RATIO: 2.3 (calc) (ref 1.0–2.5)
ALT: 28 U/L (ref 9–46)
AST: 27 U/L (ref 10–35)
Albumin: 4.2 g/dL (ref 3.6–5.1)
Alkaline phosphatase (APISO): 69 U/L (ref 40–115)
BUN: 22 mg/dL (ref 7–25)
CALCIUM: 9.2 mg/dL (ref 8.6–10.3)
CO2: 26 mmol/L (ref 20–32)
CREATININE: 1.09 mg/dL (ref 0.70–1.25)
Chloride: 106 mmol/L (ref 98–110)
GFR, EST NON AFRICAN AMERICAN: 71 mL/min/{1.73_m2} (ref >=60)
GFR, Est African American: 82 mL/min/{1.73_m2} (ref >=60)
Globulin: 1.8 g/dL (calc) — ABNORMAL LOW (ref 1.9–3.7)
Glucose, Bld: 92 mg/dL (ref 65–99)
POTASSIUM: 4.6 mmol/L (ref 3.5–5.3)
Sodium: 138 mmol/L (ref 135–146)
TOTAL PROTEIN: 6 g/dL — AB (ref 6.1–8.1)
Total Bilirubin: 0.9 mg/dL (ref 0.2–1.2)

## 2017-08-27 LAB — PSA: PSA: 0.7 ng/mL (ref <=4.0)

## 2017-08-27 LAB — TSH: TSH: 1.23 mIU/L (ref 0.40–4.50)

## 2017-09-02 ENCOUNTER — Ambulatory Visit: Payer: Medicare (Managed Care) | Admitting: Family Medicine

## 2017-09-09 ENCOUNTER — Encounter: Payer: Self-pay | Admitting: Family Medicine

## 2017-09-09 ENCOUNTER — Ambulatory Visit (INDEPENDENT_AMBULATORY_CARE_PROVIDER_SITE_OTHER): Payer: Medicare Other | Admitting: Family Medicine

## 2017-09-09 VITALS — BP 105/64 | HR 53 | Ht 70.0 in | Wt 196.0 lb

## 2017-09-09 DIAGNOSIS — Z6828 Body mass index (BMI) 28.0-28.9, adult: Secondary | ICD-10-CM

## 2017-09-09 DIAGNOSIS — I10 Essential (primary) hypertension: Secondary | ICD-10-CM

## 2017-09-09 DIAGNOSIS — I2510 Atherosclerotic heart disease of native coronary artery without angina pectoris: Secondary | ICD-10-CM

## 2017-09-09 MED ORDER — NITROGLYCERIN 0.4 MG SL SUBL: 0.4000 mg | SUBLINGUAL_TABLET | SUBLINGUAL | 1 refills | Status: DC | PRN

## 2017-09-09 NOTE — Progress Notes (Signed)
Subjective:    CC: BP, CAD   HPI:  Hypertension- Pt denies chest pain, SOB, dizziness, or heart palpitations.  Taking meds as directed w/o problems.  Denies medication side effects.    F/U CAD s/p MI in 2017- Pt denies chest pain, SOB, dizziness, or heart palpitations. Next OV with Dr. Ralph Dowdy is   Past medical history, Surgical history, Family history not pertinant except as noted below, Social history, Allergies, and medications have been entered into the medical record, reviewed, and corrections made.   Review of Systems: No fevers, chills, night sweats, weight loss, chest pain, or shortness of breath.   Objective:    General: Well Developed, well nourished, and in no acute distress.  Neuro: Alert and oriented x3, extra-ocular muscles intact, sensation grossly intact.  HEENT: Normocephalic, atraumatic  Skin: Warm and dry, no rashes. Cardiac: Regular rate and rhythm, no murmurs rubs or gallops, no lower extremity edema.  Respiratory: Clear to auscultation bilaterally. Not using accessory muscles, speaking in full sentences.   Impression and Recommendations:    HTN -blood pressures actually little bit low today.  I did have him cut his losartan in half for the next couple of weeks and monitor his blood pressure at home.  And follow-up in 1 month for nurse visit to recheck it.  I think he will actually do really well and 50 mg.  We can then adjust his prescription.  Continue current regimen. Follow up in  6 months.  Labs are up to date.    CAD - No new sxs.  Continue current regimen.  Lab Results  Component Value Date   CHOL 95 08/26/2017   HDL 46 08/26/2017   LDLCALC 37 08/26/2017   TRIG 50 08/26/2017   CHOLHDL 2.1 08/26/2017   BMI 28-weight loss looks great.  He is doing a Chief Technology Officer job.  He is walking about 3 miles per day at work and then still going to the gym which is great encouraged him to keep that up.

## 2017-09-09 NOTE — Patient Instructions (Signed)
Decrease her losartan to half a tab daily.  Follow-up in about 4 weeks for nurse visit.  Try to track at home as well just to make sure the blood pressures are going up too high.    Follow-up with me in about 6 months for your regular checkup and we will give your second pneumonia shot at that time.

## 2017-10-07 ENCOUNTER — Ambulatory Visit (INDEPENDENT_AMBULATORY_CARE_PROVIDER_SITE_OTHER): Payer: Medicare Other | Admitting: Family Medicine

## 2017-10-07 VITALS — BP 125/74 | HR 53

## 2017-10-07 DIAGNOSIS — I10 Essential (primary) hypertension: Secondary | ICD-10-CM | POA: Diagnosis not present

## 2017-10-07 DIAGNOSIS — I2510 Atherosclerotic heart disease of native coronary artery without angina pectoris: Secondary | ICD-10-CM

## 2017-10-07 MED ORDER — LOSARTAN POTASSIUM 50 MG PO TABS: 50.0000 mg | ORAL_TABLET | Freq: Every day | ORAL | 3 refills | Status: DC

## 2017-10-07 NOTE — Progress Notes (Signed)
Pt came into clinic today for BP check. At last OV, his BP was low so the Losartan 100mg  Rx was cut in half. Pt reports he has been taking 50mg  Losartan daily and checking his BP at home. States the readings are "886-484" systolic and "around 70" diastolic. Pt would like an Rx sent for the 50mg  Losartan tabs to local [facility] if PCP is going to keep him on current dose. Will pend. No further questions.

## 2017-10-07 NOTE — Progress Notes (Signed)
Agree with documentation as above.   Catherine Metheney, MD  

## 2017-11-09 DIAGNOSIS — M9901 Segmental and somatic dysfunction of cervical region: Secondary | ICD-10-CM | POA: Diagnosis not present

## 2017-11-09 DIAGNOSIS — M542 Cervicalgia: Secondary | ICD-10-CM | POA: Diagnosis not present

## 2017-11-09 DIAGNOSIS — M25551 Pain in right hip: Secondary | ICD-10-CM | POA: Diagnosis not present

## 2017-11-09 DIAGNOSIS — M25571 Pain in right ankle and joints of right foot: Secondary | ICD-10-CM | POA: Diagnosis not present

## 2017-11-09 DIAGNOSIS — M62830 Muscle spasm of back: Secondary | ICD-10-CM | POA: Diagnosis not present

## 2017-11-09 DIAGNOSIS — M545 Low back pain: Secondary | ICD-10-CM | POA: Diagnosis not present

## 2017-11-09 DIAGNOSIS — M9903 Segmental and somatic dysfunction of lumbar region: Secondary | ICD-10-CM | POA: Diagnosis not present

## 2017-11-09 DIAGNOSIS — M25552 Pain in left hip: Secondary | ICD-10-CM | POA: Diagnosis not present

## 2017-11-10 ENCOUNTER — Ambulatory Visit (INDEPENDENT_AMBULATORY_CARE_PROVIDER_SITE_OTHER): Payer: Medicare Other | Admitting: Sports Medicine

## 2017-11-10 ENCOUNTER — Encounter: Payer: Self-pay | Admitting: Sports Medicine

## 2017-11-10 DIAGNOSIS — I2510 Atherosclerotic heart disease of native coronary artery without angina pectoris: Secondary | ICD-10-CM | POA: Diagnosis not present

## 2017-11-10 DIAGNOSIS — M19071 Primary osteoarthritis, right ankle and foot: Secondary | ICD-10-CM

## 2017-11-10 NOTE — Assessment & Plan Note (Signed)
Orthotics were modified with an additional scaphoid pad on both sides and a first metatarsal ray post on the right. This seemed to provide good pain relief. MTP was injected about 6 months ago.

## 2017-11-10 NOTE — Progress Notes (Signed)
Subjective:    CC: Follow-up  HPI: This is a pleasant 67 year old male, we recently made of a new set of custom orthotics last year, he initially did well with first MTP injection, and the orthotics, he is now telling me he is got a little bit more pain at the first metatarsal and some pain under the arch.  Pain is moderate, persistent, no radiation.  I reviewed the past medical history, family history, social history, surgical history, and allergies today and no changes were needed.  Please see the problem list section below in epic for further details.  Past Medical History: Past Medical History:  Diagnosis Date  . Hypertension since 2007  . Kidney stones 2009   Past Surgical History: Past Surgical History:  Procedure Laterality Date  . KNEE ARTHROSCOPY  12/31/2010   Right knee   . TONSILLECTOMY  1957   Social History: Social History   Socioeconomic History  . Marital status: Married    Spouse name: Not on file  . Number of children: 2  . Years of education: Not on file  . Highest education level: Not on file  Occupational History  . Occupation: Retired    Fish farm manager: [employer]  . Financial resource strain: Not on file  . Food insecurity:    Worry: Not on file    Inability: Not on file  . Transportation needs:    Medical: Not on file    Non-medical: Not on file  Tobacco Use  . Smoking status: Never Smoker  . Smokeless tobacco: Never Used  Substance and Sexual Activity  . Alcohol use: Yes    Alcohol/week: 2.0 oz    Types: 4 drink(s) per week  . Drug use: No  . 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  Lifestyle  . Physical activity:    Days per week: Not on file    Minutes per session: Not on file  . Stress: Not on file  Relationships  . Social connections:    Talks on phone: Not on file    Gets  together: Not on file    Attends religious service: Not on file    Active member of club or organization: Not on file    Attends meetings of clubs or organizations: Not on file    Relationship status: Not on file  Other Topics Concern  . Not on file  Social History Narrative   No regular exercise. Retired from [employer]   Family History: Family History  Problem Relation Age of Onset  . Hypertension Mother   . Prostate cancer Maternal Uncle        Deceased  . Depression Maternal Uncle   . Cancer Paternal Grandfather   . Cancer Paternal Grandmother   . Heart disease Father    Allergies: Allergies  Allergen Reactions  . Celecoxib     REACTION: rash  . Sulfonamide Derivatives     REACTION: hives   Medications: See med rec.  Review of Systems: No fevers, chills, night sweats, weight loss, chest pain, or shortness of breath.   Objective:    General: Well Developed, well nourished, and in no acute distress.  Neuro: Alert and oriented x3, extra-ocular muscles intact, sensation grossly intact.  HEENT: Normocephalic, atraumatic, pupils equal round reactive to light, neck supple, no masses, no lymphadenopathy, thyroid nonpalpable.  Skin: Warm and dry, no rashes. Cardiac: Regular rate and rhythm, no murmurs rubs or gallops, no lower extremity edema.  Respiratory: Clear to auscultation bilaterally. Not using accessory muscles, speaking in full sentences.  Scaphoid pads into both orthotics and a right sided first metatarsal ray post.  Impression and Recommendations:    Osteoarthritis of first metatarsophalangeal (MTP) joint of right foot Orthotics were modified with an additional scaphoid pad on both sides and a first metatarsal ray post on the right. This seemed to provide good pain relief. MTP was injected about 6 months ago.  I spent 25 minutes with this patient, greater than 50% was face-to-face time counseling regarding the above  diagnoses ___________________________________________ Gwen Her. Dianah Field, M.D., ABFM., CAQSM. Primary Care and [place] Instructor of [place] of [facility]

## 2017-11-16 DIAGNOSIS — M25551 Pain in right hip: Secondary | ICD-10-CM | POA: Diagnosis not present

## 2017-11-16 DIAGNOSIS — M25552 Pain in left hip: Secondary | ICD-10-CM | POA: Diagnosis not present

## 2017-11-16 DIAGNOSIS — M9903 Segmental and somatic dysfunction of lumbar region: Secondary | ICD-10-CM | POA: Diagnosis not present

## 2017-11-16 DIAGNOSIS — M545 Low back pain: Secondary | ICD-10-CM | POA: Diagnosis not present

## 2017-11-16 DIAGNOSIS — M9901 Segmental and somatic dysfunction of cervical region: Secondary | ICD-10-CM | POA: Diagnosis not present

## 2017-11-16 DIAGNOSIS — M25571 Pain in right ankle and joints of right foot: Secondary | ICD-10-CM | POA: Diagnosis not present

## 2017-11-16 DIAGNOSIS — M62830 Muscle spasm of back: Secondary | ICD-10-CM | POA: Diagnosis not present

## 2017-11-16 DIAGNOSIS — M542 Cervicalgia: Secondary | ICD-10-CM | POA: Diagnosis not present

## 2017-11-18 ENCOUNTER — Ambulatory Visit (INDEPENDENT_AMBULATORY_CARE_PROVIDER_SITE_OTHER): Payer: Medicare Other | Admitting: Sports Medicine

## 2017-11-18 ENCOUNTER — Encounter: Payer: Self-pay | Admitting: Sports Medicine

## 2017-11-18 DIAGNOSIS — I2510 Atherosclerotic heart disease of native coronary artery without angina pectoris: Secondary | ICD-10-CM

## 2017-11-18 DIAGNOSIS — M19071 Primary osteoarthritis, right ankle and foot: Secondary | ICD-10-CM

## 2017-11-18 NOTE — Progress Notes (Signed)
Subjective:    CC: Right foot pain  HPI: Joshua Warner is a pleasant 67 year old male with right first MTP osteoarthritis, we added first metatarsal ray post to his orthotic which helped significantly, still has considerable pain.  Moderate, persist, localized without radiation, no trauma.  I reviewed the past medical history, family history, social history, surgical history, and allergies today and no changes were needed.  Please see the problem list section below in epic for further details.  Past Medical History: Past Medical History:  Diagnosis Date  . Hypertension since 2007  . Kidney stones 2009   Past Surgical History: Past Surgical History:  Procedure Laterality Date  . KNEE ARTHROSCOPY  12/31/2010   Right knee   . TONSILLECTOMY  1957   Social History: Social History   Socioeconomic History  . Marital status: Married    Spouse name: Not on file  . Number of children: 2  . Years of education: Not on file  . Highest education level: Not on file  Occupational History  . Occupation: Retired    Fish farm manager: [employer]  . Financial resource strain: Not on file  . Food insecurity:    Worry: Not on file    Inability: Not on file  . Transportation needs:    Medical: Not on file    Non-medical: Not on file  Tobacco Use  . Smoking status: Never Smoker  . Smokeless tobacco: Never Used  Substance and Sexual Activity  . Alcohol use: Yes    Alcohol/week: 2.0 oz    Types: 4 drink(s) per week  . Drug use: No  . 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  Lifestyle  . Physical activity:    Days per week: Not on file    Minutes per session: Not on file  . Stress: Not on file  Relationships  . Social connections:    Talks on phone: Not on file    Gets together: Not on file    Attends religious service: Not on file   Active member of club or organization: Not on file    Attends meetings of clubs or organizations: Not on file    Relationship status: Not on file  Other Topics Concern  . Not on file  Social History Narrative   No regular exercise. Retired from [employer]   Family History: Family History  Problem Relation Age of Onset  . Hypertension Mother   . Prostate cancer Maternal Uncle        Deceased  . Depression Maternal Uncle   . Cancer Paternal Grandfather   . Cancer Paternal Grandmother   . Heart disease Father    Allergies: Allergies  Allergen Reactions  . Celecoxib     REACTION: rash  . Sulfonamide Derivatives     REACTION: hives   Medications: See med rec.  Review of Systems: No fevers, chills, night sweats, weight loss, chest pain, or shortness of breath.   Objective:    General: Well Developed, well nourished, and in no acute distress.  Neuro: Alert and oriented x3, extra-ocular muscles intact, sensation grossly intact.  HEENT: Normocephalic, atraumatic, pupils equal round reactive to light, neck supple, no masses, no lymphadenopathy, thyroid nonpalpable.  Skin: Warm and dry, no rashes. Cardiac: Regular rate and rhythm, no murmurs rubs or gallops, no lower extremity edema.  Respiratory: Clear  to auscultation bilaterally. Not using accessory muscles, speaking in full sentences. Right foot: No visible erythema or swelling. Range of motion is full in all directions. Strength is 5/5 in all directions. Hallux valgus with swelling and tenderness at the first MTP No pes cavus or pes planus. No abnormal callus noted. No pain over the navicular prominence, or base of fifth metatarsal. No tenderness to palpation of the calcaneal insertion of plantar fascia. No pain at the Achilles insertion. No pain over the calcaneal bursa. No pain of the retrocalcaneal bursa. No tenderness to palpation over the tarsals, metatarsals, or phalanges. No hallux rigidus or limitus. No tenderness  palpation over interphalangeal joints. No pain with compression of the metatarsal heads. Neurovascularly intact distally.  Procedure: Real-time Ultrasound Guided Injection of right first MTP Device: GE Logiq E  Verbal informed consent obtained.  Time-out conducted.  Noted no overlying erythema, induration, or other signs of local infection.  Skin prepped in a sterile fashion.  Local anesthesia: Topical Ethyl chloride.  With sterile technique and under real time ultrasound guidance: 1/2 cc Kenalog 40, 1/2 cc lidocaine injected easily Completed without difficulty  Pain immediately resolved suggesting accurate placement of the medication.  Advised to call if fevers/chills, erythema, induration, drainage, or persistent bleeding.  Images permanently stored and available for review in the ultrasound unit.  Impression: Technically successful ultrasound guided injection.  Impression and Recommendations:    Osteoarthritis of first metatarsophalangeal (MTP) joint of right foot With fairly severe hallux valgus, I did modify his orthotics about 2 weeks ago with an additional scaphoid pad and a first metatarsal ray post. We injected his first MTP about 6 to 7 months ago. Partial pain relief with the above treatment, he did need an injection today. Return as needed. ___________________________________________ Gwen Her. Dianah Field, M.D., ABFM., CAQSM. Primary Care and [place] Instructor of [place] of [facility] of Medicine

## 2017-11-18 NOTE — Assessment & Plan Note (Signed)
With fairly severe hallux valgus, I did modify his orthotics about 2 weeks ago with an additional scaphoid pad and a first metatarsal ray post. We injected his first MTP about 6 to 7 months ago. Partial pain relief with the above treatment, he did need an injection today. Return as needed.

## 2017-12-08 ENCOUNTER — Telehealth: Payer: Self-pay

## 2017-12-08 NOTE — Telephone Encounter (Signed)
Pt left Vm on triage line requesting that a copy of his latest labs be faxed to his cardiologist, Dr Ralph Dowdy.  Labs faxed and pt advised. No further needs at this time.

## 2017-12-14 DIAGNOSIS — M545 Low back pain: Secondary | ICD-10-CM | POA: Diagnosis not present

## 2017-12-14 DIAGNOSIS — M542 Cervicalgia: Secondary | ICD-10-CM | POA: Diagnosis not present

## 2017-12-14 DIAGNOSIS — M9901 Segmental and somatic dysfunction of cervical region: Secondary | ICD-10-CM | POA: Diagnosis not present

## 2017-12-14 DIAGNOSIS — M25552 Pain in left hip: Secondary | ICD-10-CM | POA: Diagnosis not present

## 2017-12-14 DIAGNOSIS — M62830 Muscle spasm of back: Secondary | ICD-10-CM | POA: Diagnosis not present

## 2017-12-14 DIAGNOSIS — M25551 Pain in right hip: Secondary | ICD-10-CM | POA: Diagnosis not present

## 2017-12-14 DIAGNOSIS — M25571 Pain in right ankle and joints of right foot: Secondary | ICD-10-CM | POA: Diagnosis not present

## 2017-12-14 DIAGNOSIS — M9903 Segmental and somatic dysfunction of lumbar region: Secondary | ICD-10-CM | POA: Diagnosis not present

## 2017-12-23 DIAGNOSIS — M545 Low back pain: Secondary | ICD-10-CM | POA: Diagnosis not present

## 2017-12-23 DIAGNOSIS — M9901 Segmental and somatic dysfunction of cervical region: Secondary | ICD-10-CM | POA: Diagnosis not present

## 2017-12-23 DIAGNOSIS — M25552 Pain in left hip: Secondary | ICD-10-CM | POA: Diagnosis not present

## 2017-12-23 DIAGNOSIS — M25571 Pain in right ankle and joints of right foot: Secondary | ICD-10-CM | POA: Diagnosis not present

## 2017-12-23 DIAGNOSIS — M9903 Segmental and somatic dysfunction of lumbar region: Secondary | ICD-10-CM | POA: Diagnosis not present

## 2017-12-23 DIAGNOSIS — M542 Cervicalgia: Secondary | ICD-10-CM | POA: Diagnosis not present

## 2017-12-23 DIAGNOSIS — M62830 Muscle spasm of back: Secondary | ICD-10-CM | POA: Diagnosis not present

## 2017-12-23 DIAGNOSIS — M25551 Pain in right hip: Secondary | ICD-10-CM | POA: Diagnosis not present

## 2017-12-29 ENCOUNTER — Other Ambulatory Visit: Payer: Self-pay | Admitting: Family Medicine

## 2017-12-29 DIAGNOSIS — K219 Gastro-esophageal reflux disease without esophagitis: Secondary | ICD-10-CM

## 2018-01-20 DIAGNOSIS — M542 Cervicalgia: Secondary | ICD-10-CM | POA: Diagnosis not present

## 2018-01-20 DIAGNOSIS — M9901 Segmental and somatic dysfunction of cervical region: Secondary | ICD-10-CM | POA: Diagnosis not present

## 2018-01-20 DIAGNOSIS — M25552 Pain in left hip: Secondary | ICD-10-CM | POA: Diagnosis not present

## 2018-01-20 DIAGNOSIS — M9903 Segmental and somatic dysfunction of lumbar region: Secondary | ICD-10-CM | POA: Diagnosis not present

## 2018-01-20 DIAGNOSIS — M62830 Muscle spasm of back: Secondary | ICD-10-CM | POA: Diagnosis not present

## 2018-01-20 DIAGNOSIS — M545 Low back pain: Secondary | ICD-10-CM | POA: Diagnosis not present

## 2018-01-20 DIAGNOSIS — M25551 Pain in right hip: Secondary | ICD-10-CM | POA: Diagnosis not present

## 2018-02-16 DIAGNOSIS — I2510 Atherosclerotic heart disease of native coronary artery without angina pectoris: Secondary | ICD-10-CM | POA: Diagnosis not present

## 2018-02-16 DIAGNOSIS — Z955 Presence of coronary angioplasty implant and graft: Secondary | ICD-10-CM | POA: Diagnosis not present

## 2018-02-17 DIAGNOSIS — M545 Low back pain: Secondary | ICD-10-CM | POA: Diagnosis not present

## 2018-02-17 DIAGNOSIS — M9903 Segmental and somatic dysfunction of lumbar region: Secondary | ICD-10-CM | POA: Diagnosis not present

## 2018-02-17 DIAGNOSIS — M62830 Muscle spasm of back: Secondary | ICD-10-CM | POA: Diagnosis not present

## 2018-02-17 DIAGNOSIS — M542 Cervicalgia: Secondary | ICD-10-CM | POA: Diagnosis not present

## 2018-02-17 DIAGNOSIS — M25552 Pain in left hip: Secondary | ICD-10-CM | POA: Diagnosis not present

## 2018-02-17 DIAGNOSIS — M25551 Pain in right hip: Secondary | ICD-10-CM | POA: Diagnosis not present

## 2018-02-17 DIAGNOSIS — M9901 Segmental and somatic dysfunction of cervical region: Secondary | ICD-10-CM | POA: Diagnosis not present

## 2018-03-10 ENCOUNTER — Ambulatory Visit (INDEPENDENT_AMBULATORY_CARE_PROVIDER_SITE_OTHER): Payer: Medicare Other | Admitting: Sports Medicine

## 2018-03-10 DIAGNOSIS — M19071 Primary osteoarthritis, right ankle and foot: Secondary | ICD-10-CM | POA: Diagnosis not present

## 2018-03-10 DIAGNOSIS — I2510 Atherosclerotic heart disease of native coronary artery without angina pectoris: Secondary | ICD-10-CM

## 2018-03-10 NOTE — Progress Notes (Signed)
Subjective:    CC: Right foot pain  HPI: This is a pleasant 67 year old male with known right first MTP osteoarthritis.  We have done a couple of injections, each 1 lasting somewhere between 5 to 7 months, most recent of which was about 4 months ago.  Recurrence of pain, he does have a bunion as well at this location.  We have done custom orthotics with first metatarsal ray posting, NSAIDs.  Pain is moderate, persistent, localized without radiation.  I reviewed the past medical history, family history, social history, surgical history, and allergies today and no changes were needed.  Please see the problem list section below in epic for further details.  Past Medical History: Past Medical History:  Diagnosis Date  . Hypertension since 2007  . Kidney stones 2009   Past Surgical History: Past Surgical History:  Procedure Laterality Date  . KNEE ARTHROSCOPY  12/31/2010   Right knee   . TONSILLECTOMY  1957   Social History: Social History   Socioeconomic History  . Marital status: Married    Spouse name: Not on file  . Number of children: 2  . Years of education: Not on file  . Highest education level: Not on file  Occupational History  . Occupation: Retired    Fish farm manager: [employer]  . Financial resource strain: Not on file  . Food insecurity:    Worry: Not on file    Inability: Not on file  . Transportation needs:    Medical: Not on file    Non-medical: Not on file  Tobacco Use  . Smoking status: Never Smoker  . Smokeless tobacco: Never Used  Substance and Sexual Activity  . Alcohol use: Yes    Alcohol/week: 4.0 standard drinks    Types: 4 drink(s) per week  . Drug use: No  . 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  Lifestyle  . Physical activity:    Days per week: Not on file    Minutes per session: Not  on file  . Stress: Not on file  Relationships  . Social connections:    Talks on phone: Not on file    Gets together: Not on file    Attends religious service: Not on file    Active member of club or organization: Not on file    Attends meetings of clubs or organizations: Not on file    Relationship status: Not on file  Other Topics Concern  . Not on file  Social History Narrative   No regular exercise. Retired from [employer]   Family History: Family History  Problem Relation Age of Onset  . Hypertension Mother   . Prostate cancer Maternal Uncle        Deceased  . Depression Maternal Uncle   . Cancer Paternal Grandfather   . Cancer Paternal Grandmother   . Heart disease Father    Allergies: Allergies  Allergen Reactions  . Celecoxib     REACTION: rash  . Sulfonamide Derivatives     REACTION: hives   Medications: See med rec.  Review of Systems: No fevers, chills, night sweats, weight loss, chest pain, or shortness of breath.   Objective:    General: Well Developed, well nourished, and in no acute distress.  Neuro: Alert and oriented x3, extra-ocular muscles intact, sensation grossly intact.  HEENT:  Normocephalic, atraumatic, pupils equal round reactive to light, neck supple, no masses, no lymphadenopathy, thyroid nonpalpable.  Skin: Warm and dry, no rashes. Cardiac: Regular rate and rhythm, no murmurs rubs or gallops, no lower extremity edema.  Respiratory: Clear to auscultation bilaterally. Not using accessory muscles, speaking in full sentences. Right foot: No visible erythema or swelling. Range of motion is full in all directions. Strength is 5/5 in all directions. Hallux valgus with first MTP swelling and pain.  No pes cavus or pes planus. No abnormal callus noted. No pain over the navicular prominence, or base of fifth metatarsal. No tenderness to palpation of the calcaneal insertion of plantar fascia. No pain at the Achilles insertion. No pain over the  calcaneal bursa. No pain of the retrocalcaneal bursa. No tenderness to palpation over the tarsals, metatarsals, or phalanges. No hallux rigidus or limitus. No tenderness palpation over interphalangeal joints. No pain with compression of the metatarsal heads. Neurovascularly intact distally.  Procedure: Real-time Ultrasound Guided Injection of right first MTP Device: GE Logiq E  Verbal informed consent obtained.  Time-out conducted.  Noted no overlying erythema, induration, or other signs of local infection.  Skin prepped in a sterile fashion.  Local anesthesia: Topical Ethyl chloride.  With sterile technique and under real time ultrasound guidance: 1 cc kenalog 40, 1/2 cc lidocaine injected easily Completed without difficulty  Pain immediately resolved suggesting accurate placement of the medication.  Advised to call if fevers/chills, erythema, induration, drainage, or persistent bleeding.  Images permanently stored and available for review in the ultrasound unit.  Impression: Technically successful ultrasound guided injection.  Impression and Recommendations:    Osteoarthritis of first metatarsophalangeal (MTP) joint of right foot Orthotics with first metatarsal ray posting not sufficiently effective. Last injected in May. Repeat first MTP injection today, referral to podiatry for consideration of first MTP fusion, I do not think a simple chevron osteotomy will improve his pain considering the degenerative changes in his MTP. He may also benefit from a first metatarsal ray plate addition to his orthotic.  ___________________________________________ Gwen Her. Dianah Field, M.D., ABFM., CAQSM. Primary Care and [place] Instructor of [place] of [facility] of Medicine

## 2018-03-10 NOTE — Assessment & Plan Note (Signed)
Orthotics with first metatarsal ray posting not sufficiently effective. Last injected in May. Repeat first MTP injection today, referral to podiatry for consideration of first MTP fusion, I do not think a simple chevron osteotomy will improve his pain considering the degenerative changes in his MTP. He may also benefit from a first metatarsal ray plate addition to his orthotic.

## 2018-03-13 ENCOUNTER — Encounter: Payer: Self-pay | Admitting: Family Medicine

## 2018-03-13 ENCOUNTER — Ambulatory Visit (INDEPENDENT_AMBULATORY_CARE_PROVIDER_SITE_OTHER): Payer: Medicare Other | Admitting: Family Medicine

## 2018-03-13 VITALS — BP 126/72 | HR 52 | Ht 70.0 in | Wt 200.0 lb

## 2018-03-13 DIAGNOSIS — I10 Essential (primary) hypertension: Secondary | ICD-10-CM

## 2018-03-13 DIAGNOSIS — I2510 Atherosclerotic heart disease of native coronary artery without angina pectoris: Secondary | ICD-10-CM | POA: Diagnosis not present

## 2018-03-13 DIAGNOSIS — M19071 Primary osteoarthritis, right ankle and foot: Secondary | ICD-10-CM

## 2018-03-13 DIAGNOSIS — Z23 Encounter for immunization: Secondary | ICD-10-CM

## 2018-03-13 NOTE — Progress Notes (Signed)
Subjective:    CC: HTN   HPI:  Hypertension- Pt denies chest pain, SOB, dizziness, or heart palpitations.  Taking meds as directed w/o problems.  Denies medication side effects.  We did cut his losartan in half the last time I saw him because of low blood pressures.  He says at home his blood pressures have looked great when he is working out regularly his blood pressure is often between 115 and 120.  CAD f/u -recent chest pain or shortness of breath.  He is actually been able to lose 5 pounds.  He is taking a daily aspirin as well as statin.  Also wanted to update me and let me know that he has been having some problems with his right foot he was diagnosed with arthritis of the first metatarsophalangeal joint and he also has a second toe that tends to overlap over the great toe.  He has been seeing 1 of our sports medicine doctors in fact just had an injection on Friday.  He is being referred to podiatry for further evaluation for the issue with his second toe.  Unfortunately this has kept him from working out as much as he would like.  Past medical history, Surgical history, Family history not pertinant except as noted below, Social history, Allergies, and medications have been entered into the medical record, reviewed, and corrections made.   Review of Systems: No fevers, chills, night sweats, weight loss, chest pain, or shortness of breath.   Objective:    General: Well Developed, well nourished, and in no acute distress.  Neuro: Alert and oriented x3, extra-ocular muscles intact, sensation grossly intact.  HEENT: Normocephalic, atraumatic  Skin: Warm and dry, no rashes. Cardiac: Regular rate and rhythm, no murmurs rubs or gallops, no lower extremity edema.  Respiratory: Clear to auscultation bilaterally. Not using accessory muscles, speaking in full sentences.   Impression and Recommendations:    HTN - Well controlled. Continue current regimen. Follow up in  6 months.   CAD -  stable. Saw Cards about 2 months ago and is following up yearly.  He is due for a BMP.  Arthritis of the MTP joint of the right foot-just had an injection on Friday and has gotten some relief with that.  We also discussed strategies such as stationary bike and rowing machine instead of getting on the treadmill.  He will schedule an appointment for consultation with podiatry.

## 2018-03-16 DIAGNOSIS — I10 Essential (primary) hypertension: Secondary | ICD-10-CM | POA: Diagnosis not present

## 2018-03-16 DIAGNOSIS — I2510 Atherosclerotic heart disease of native coronary artery without angina pectoris: Secondary | ICD-10-CM | POA: Diagnosis not present

## 2018-03-16 LAB — BASIC METABOLIC PANEL WITH GFR
BUN: 22 mg/dL (ref 7–25)
CHLORIDE: 103 mmol/L (ref 98–110)
CO2: 24 mmol/L (ref 20–32)
Calcium: 9.1 mg/dL (ref 8.6–10.3)
Creat: 1.16 mg/dL (ref 0.70–1.25)
GFR, EST AFRICAN AMERICAN: 76 mL/min/{1.73_m2} (ref >=60)
GFR, EST NON AFRICAN AMERICAN: 65 mL/min/{1.73_m2} (ref >=60)
Glucose, Bld: 98 mg/dL (ref 65–99)
Potassium: 4.3 mmol/L (ref 3.5–5.3)
Sodium: 138 mmol/L (ref 135–146)

## 2018-03-17 DIAGNOSIS — M25552 Pain in left hip: Secondary | ICD-10-CM | POA: Diagnosis not present

## 2018-03-17 DIAGNOSIS — M62830 Muscle spasm of back: Secondary | ICD-10-CM | POA: Diagnosis not present

## 2018-03-17 DIAGNOSIS — M25551 Pain in right hip: Secondary | ICD-10-CM | POA: Diagnosis not present

## 2018-03-17 DIAGNOSIS — M9903 Segmental and somatic dysfunction of lumbar region: Secondary | ICD-10-CM | POA: Diagnosis not present

## 2018-03-17 DIAGNOSIS — M9901 Segmental and somatic dysfunction of cervical region: Secondary | ICD-10-CM | POA: Diagnosis not present

## 2018-03-17 DIAGNOSIS — M545 Low back pain: Secondary | ICD-10-CM | POA: Diagnosis not present

## 2018-03-17 DIAGNOSIS — M542 Cervicalgia: Secondary | ICD-10-CM | POA: Diagnosis not present

## 2018-03-17 NOTE — Progress Notes (Signed)
All labs are normal. 

## 2018-04-03 ENCOUNTER — Ambulatory Visit (INDEPENDENT_AMBULATORY_CARE_PROVIDER_SITE_OTHER): Payer: Medicare Other | Admitting: Podiatry

## 2018-04-03 ENCOUNTER — Encounter: Payer: Self-pay | Admitting: Podiatry

## 2018-04-03 DIAGNOSIS — M79671 Pain in right foot: Secondary | ICD-10-CM

## 2018-04-03 DIAGNOSIS — M21619 Bunion of unspecified foot: Secondary | ICD-10-CM | POA: Diagnosis not present

## 2018-04-03 DIAGNOSIS — M2011 Hallux valgus (acquired), right foot: Secondary | ICD-10-CM

## 2018-04-03 DIAGNOSIS — M79672 Pain in left foot: Secondary | ICD-10-CM | POA: Diagnosis not present

## 2018-04-03 DIAGNOSIS — I2510 Atherosclerotic heart disease of native coronary artery without angina pectoris: Secondary | ICD-10-CM | POA: Diagnosis not present

## 2018-04-03 DIAGNOSIS — M2012 Hallux valgus (acquired), left foot: Secondary | ICD-10-CM

## 2018-04-03 NOTE — Patient Instructions (Signed)
Seen for bilateral bunion pain. Discussed palliative care using Coban wrap and Gel toe spreader.  Return as needed.

## 2018-04-03 NOTE — Progress Notes (Signed)
SUBJECTIVE: 67 y.o. year old male presents complaining of painful bunions R>L duration of several years. Right side is start hurting more for the last one year. Since 2 years ago as doing more physical exercise after a heart condition feet are hurting more. Pain is not as much if not exercise. Using Orthotic made by Dr. Darene Lamer and gets injection as needed.  Review of Systems  Constitutional: Negative.   HENT: Negative.   Eyes: Negative.   Respiratory: Negative.   Cardiovascular:       Heart attack 2 years ago.  Gastrointestinal: Negative.   Genitourinary: Negative.   Musculoskeletal: Negative.   Skin: Negative.      OBJECTIVE: DERMATOLOGIC EXAMINATION: No abnormal skin lesions noted.  VASCULAR EXAMINATION OF LOWER LIMBS: All pedal pulses are palpable with normal pulsation.  Capillary Filling times within 3 seconds in all digits.  No acute edema or erythema noted. Temperature gradient from tibial crest to dorsum of foot is within normal bilateral.  NEUROLOGIC EXAMINATION OF THE LOWER LIMBS: All epicritic and tactile sensations grossly intact. Sharp and Dull discriminatory sensations at the plantar ball of hallux is intact bilateral.   MUSCULOSKELETAL EXAMINATION: Positive for severe hallux valgus with bunion bilateral. Overriding 2nd toe over the big toe bilateral.   ASSESSMENT: HAV with bunion bilateral. Overapping 2nd toe over the first bilateral. Pain with ambulation.  PLAN: Reviewed clinical findings and available treatment options. Buddy wrapping on 2nd and 3rd toe with Coban bandage, Gel toe spreader at the first interdigital space done bilateral.. Return as needed.

## 2018-04-05 ENCOUNTER — Encounter: Payer: Self-pay | Admitting: Family Medicine

## 2018-04-05 ENCOUNTER — Ambulatory Visit (INDEPENDENT_AMBULATORY_CARE_PROVIDER_SITE_OTHER): Payer: Medicare Other | Admitting: Family Medicine

## 2018-04-05 VITALS — BP 103/71 | HR 58 | Temp 98.4°F | Ht 70.0 in | Wt 201.0 lb

## 2018-04-05 DIAGNOSIS — I2510 Atherosclerotic heart disease of native coronary artery without angina pectoris: Secondary | ICD-10-CM

## 2018-04-05 DIAGNOSIS — J069 Acute upper respiratory infection, unspecified: Secondary | ICD-10-CM | POA: Diagnosis not present

## 2018-04-05 MED ORDER — HYDROCODONE-HOMATROPINE 5-1.5 MG/5ML PO SYRP: 5.0000 mL | ORAL_SOLUTION | Freq: Every evening | ORAL | 0 refills | Status: DC | PRN

## 2018-04-05 MED ORDER — BENZONATATE 200 MG PO CAPS: 200.0000 mg | ORAL_CAPSULE | Freq: Two times a day (BID) | ORAL | 0 refills | Status: DC | PRN

## 2018-04-05 NOTE — Progress Notes (Signed)
   Subjective:    Patient ID: Joshua Warner, male    DOB: 08/10/1950, 67 y.o.   MRN: [id number]  HPI 67 year old male comes in today complaining of a dry cough for approximately 2 weeks.  He is been using Robitussin.  He denies any sinus congestion or drainage.  No fevers chills or sweats. Using cough drops as well.  Occ wheeze. Not sleeping well at night bc of cough.     Review of Systems     Objective:   Physical Exam  Constitutional: He is oriented to person, place, and time. He appears well-developed and well-nourished.  HENT:  Head: Normocephalic and atraumatic.  Right Ear: External ear normal.  Left Ear: External ear normal.  Nose: Nose normal.  Mouth/Throat: Oropharynx is clear and moist.  TMs and canals are clear.   Eyes: Pupils are equal, round, and reactive to light. Conjunctivae and EOM are normal.  Neck: Neck supple. No thyromegaly present.  Cardiovascular: Normal rate and normal heart sounds.  Pulmonary/Chest: Effort normal and breath sounds normal.  Lymphadenopathy:    He has no cervical adenopathy.  Neurological: He is alert and oriented to person, place, and time.  Skin: Skin is warm and dry.  Psychiatric: He has a normal mood and affect.        Assessment & Plan:  URI -discussed likely viral.  We will send her prescription for Tessalon Perles to use during the day and a small amount of cough syrup to use just at bedtime.  Did warn about potential for sedation he says he is taken it before.  If not at least 50% better by Monday then please call us back.  If at any point he feels like he is getting worse, become short of breath or develops a fever then please let us know sooner.

## 2018-04-14 DIAGNOSIS — M9901 Segmental and somatic dysfunction of cervical region: Secondary | ICD-10-CM | POA: Diagnosis not present

## 2018-04-14 DIAGNOSIS — M25551 Pain in right hip: Secondary | ICD-10-CM | POA: Diagnosis not present

## 2018-04-14 DIAGNOSIS — M62830 Muscle spasm of back: Secondary | ICD-10-CM | POA: Diagnosis not present

## 2018-04-14 DIAGNOSIS — M25552 Pain in left hip: Secondary | ICD-10-CM | POA: Diagnosis not present

## 2018-04-14 DIAGNOSIS — M9903 Segmental and somatic dysfunction of lumbar region: Secondary | ICD-10-CM | POA: Diagnosis not present

## 2018-04-14 DIAGNOSIS — M545 Low back pain: Secondary | ICD-10-CM | POA: Diagnosis not present

## 2018-04-14 DIAGNOSIS — M542 Cervicalgia: Secondary | ICD-10-CM | POA: Diagnosis not present

## 2018-05-12 DIAGNOSIS — M542 Cervicalgia: Secondary | ICD-10-CM | POA: Diagnosis not present

## 2018-05-12 DIAGNOSIS — M25552 Pain in left hip: Secondary | ICD-10-CM | POA: Diagnosis not present

## 2018-05-12 DIAGNOSIS — M9903 Segmental and somatic dysfunction of lumbar region: Secondary | ICD-10-CM | POA: Diagnosis not present

## 2018-05-12 DIAGNOSIS — M9901 Segmental and somatic dysfunction of cervical region: Secondary | ICD-10-CM | POA: Diagnosis not present

## 2018-05-12 DIAGNOSIS — M25551 Pain in right hip: Secondary | ICD-10-CM | POA: Diagnosis not present

## 2018-05-12 DIAGNOSIS — M62830 Muscle spasm of back: Secondary | ICD-10-CM | POA: Diagnosis not present

## 2018-05-12 DIAGNOSIS — M545 Low back pain: Secondary | ICD-10-CM | POA: Diagnosis not present

## 2018-06-07 ENCOUNTER — Other Ambulatory Visit: Payer: Self-pay | Admitting: Family Medicine

## 2018-06-07 DIAGNOSIS — K219 Gastro-esophageal reflux disease without esophagitis: Secondary | ICD-10-CM

## 2018-06-14 DIAGNOSIS — M25552 Pain in left hip: Secondary | ICD-10-CM | POA: Diagnosis not present

## 2018-06-14 DIAGNOSIS — M9903 Segmental and somatic dysfunction of lumbar region: Secondary | ICD-10-CM | POA: Diagnosis not present

## 2018-06-14 DIAGNOSIS — M25551 Pain in right hip: Secondary | ICD-10-CM | POA: Diagnosis not present

## 2018-06-14 DIAGNOSIS — M62830 Muscle spasm of back: Secondary | ICD-10-CM | POA: Diagnosis not present

## 2018-06-14 DIAGNOSIS — M9901 Segmental and somatic dysfunction of cervical region: Secondary | ICD-10-CM | POA: Diagnosis not present

## 2018-06-14 DIAGNOSIS — M542 Cervicalgia: Secondary | ICD-10-CM | POA: Diagnosis not present

## 2018-06-14 DIAGNOSIS — M545 Low back pain: Secondary | ICD-10-CM | POA: Diagnosis not present

## 2018-08-04 DIAGNOSIS — M25551 Pain in right hip: Secondary | ICD-10-CM | POA: Diagnosis not present

## 2018-08-04 DIAGNOSIS — M542 Cervicalgia: Secondary | ICD-10-CM | POA: Diagnosis not present

## 2018-08-04 DIAGNOSIS — M545 Low back pain: Secondary | ICD-10-CM | POA: Diagnosis not present

## 2018-08-04 DIAGNOSIS — M62830 Muscle spasm of back: Secondary | ICD-10-CM | POA: Diagnosis not present

## 2018-08-04 DIAGNOSIS — M9903 Segmental and somatic dysfunction of lumbar region: Secondary | ICD-10-CM | POA: Diagnosis not present

## 2018-08-04 DIAGNOSIS — M9901 Segmental and somatic dysfunction of cervical region: Secondary | ICD-10-CM | POA: Diagnosis not present

## 2018-08-04 DIAGNOSIS — M25552 Pain in left hip: Secondary | ICD-10-CM | POA: Diagnosis not present

## 2018-08-14 DIAGNOSIS — M542 Cervicalgia: Secondary | ICD-10-CM | POA: Diagnosis not present

## 2018-08-14 DIAGNOSIS — M62830 Muscle spasm of back: Secondary | ICD-10-CM | POA: Diagnosis not present

## 2018-08-14 DIAGNOSIS — M9901 Segmental and somatic dysfunction of cervical region: Secondary | ICD-10-CM | POA: Diagnosis not present

## 2018-08-14 DIAGNOSIS — M25551 Pain in right hip: Secondary | ICD-10-CM | POA: Diagnosis not present

## 2018-08-14 DIAGNOSIS — M9903 Segmental and somatic dysfunction of lumbar region: Secondary | ICD-10-CM | POA: Diagnosis not present

## 2018-08-14 DIAGNOSIS — M25552 Pain in left hip: Secondary | ICD-10-CM | POA: Diagnosis not present

## 2018-08-14 DIAGNOSIS — M545 Low back pain: Secondary | ICD-10-CM | POA: Diagnosis not present

## 2018-09-01 DIAGNOSIS — M25551 Pain in right hip: Secondary | ICD-10-CM | POA: Diagnosis not present

## 2018-09-01 DIAGNOSIS — M545 Low back pain: Secondary | ICD-10-CM | POA: Diagnosis not present

## 2018-09-01 DIAGNOSIS — M62830 Muscle spasm of back: Secondary | ICD-10-CM | POA: Diagnosis not present

## 2018-09-01 DIAGNOSIS — M25552 Pain in left hip: Secondary | ICD-10-CM | POA: Diagnosis not present

## 2018-09-01 DIAGNOSIS — M9903 Segmental and somatic dysfunction of lumbar region: Secondary | ICD-10-CM | POA: Diagnosis not present

## 2018-09-01 DIAGNOSIS — M9901 Segmental and somatic dysfunction of cervical region: Secondary | ICD-10-CM | POA: Diagnosis not present

## 2018-09-01 DIAGNOSIS — M542 Cervicalgia: Secondary | ICD-10-CM | POA: Diagnosis not present

## 2018-09-04 ENCOUNTER — Telehealth: Payer: Self-pay

## 2018-09-04 DIAGNOSIS — Z125 Encounter for screening for malignant neoplasm of prostate: Secondary | ICD-10-CM

## 2018-09-04 DIAGNOSIS — Z1329 Encounter for screening for other suspected endocrine disorder: Secondary | ICD-10-CM

## 2018-09-04 DIAGNOSIS — Z0000 Encounter for general adult medical examination without abnormal findings: Secondary | ICD-10-CM

## 2018-09-04 DIAGNOSIS — E785 Hyperlipidemia, unspecified: Secondary | ICD-10-CM

## 2018-09-04 DIAGNOSIS — I10 Essential (primary) hypertension: Secondary | ICD-10-CM

## 2018-09-04 DIAGNOSIS — Z8042 Family history of malignant neoplasm of prostate: Secondary | ICD-10-CM

## 2018-09-04 NOTE — Telephone Encounter (Signed)
Labs ordered.  Patient aware.

## 2018-09-05 DIAGNOSIS — Z125 Encounter for screening for malignant neoplasm of prostate: Secondary | ICD-10-CM | POA: Diagnosis not present

## 2018-09-05 DIAGNOSIS — Z0000 Encounter for general adult medical examination without abnormal findings: Secondary | ICD-10-CM | POA: Diagnosis not present

## 2018-09-05 DIAGNOSIS — I10 Essential (primary) hypertension: Secondary | ICD-10-CM | POA: Diagnosis not present

## 2018-09-05 DIAGNOSIS — E785 Hyperlipidemia, unspecified: Secondary | ICD-10-CM | POA: Diagnosis not present

## 2018-09-05 DIAGNOSIS — Z8042 Family history of malignant neoplasm of prostate: Secondary | ICD-10-CM | POA: Diagnosis not present

## 2018-09-05 DIAGNOSIS — Z1329 Encounter for screening for other suspected endocrine disorder: Secondary | ICD-10-CM | POA: Diagnosis not present

## 2018-09-06 LAB — TSH: TSH: 1.05 mIU/L (ref 0.40–4.50)

## 2018-09-06 LAB — COMPLETE METABOLIC PANEL WITH GFR
AG Ratio: 2.2 (calc) (ref 1.0–2.5)
ALT: 23 U/L (ref 9–46)
AST: 25 U/L (ref 10–35)
Albumin: 4.3 g/dL (ref 3.6–5.1)
Alkaline phosphatase (APISO): 63 U/L (ref 35–144)
BUN: 16 mg/dL (ref 7–25)
CO2: 26 mmol/L (ref 20–32)
CREATININE: 1.03 mg/dL (ref 0.70–1.25)
Calcium: 9.4 mg/dL (ref 8.6–10.3)
Chloride: 104 mmol/L (ref 98–110)
GFR, EST NON AFRICAN AMERICAN: 75 mL/min/{1.73_m2} (ref >=60)
GFR, Est African American: 87 mL/min/{1.73_m2} (ref >=60)
GLUCOSE: 90 mg/dL (ref 65–99)
Globulin: 2 g/dL (calc) (ref 1.9–3.7)
Potassium: 4.3 mmol/L (ref 3.5–5.3)
Sodium: 137 mmol/L (ref 135–146)
Total Bilirubin: 0.8 mg/dL (ref 0.2–1.2)
Total Protein: 6.3 g/dL (ref 6.1–8.1)

## 2018-09-06 LAB — LIPID PANEL W/REFLEX DIRECT LDL
Cholesterol: 107 mg/dL (ref <200)
HDL: 48 mg/dL (ref >=40)
LDL Cholesterol (Calc): 45 mg/dL (calc)
Non-HDL Cholesterol (Calc): 59 mg/dL (calc) (ref <130)
Total CHOL/HDL Ratio: 2.2 (calc) (ref <5.0)
Triglycerides: 64 mg/dL (ref <150)

## 2018-09-06 LAB — PSA: PSA: 0.8 ng/mL (ref <=4.0)

## 2018-09-06 NOTE — Telephone Encounter (Signed)
All labs are normal. 

## 2018-09-11 ENCOUNTER — Encounter: Payer: Self-pay | Admitting: Family Medicine

## 2018-09-11 ENCOUNTER — Ambulatory Visit (INDEPENDENT_AMBULATORY_CARE_PROVIDER_SITE_OTHER): Payer: Medicare Other | Admitting: Family Medicine

## 2018-09-11 ENCOUNTER — Other Ambulatory Visit: Payer: Self-pay

## 2018-09-11 VITALS — BP 132/82 | HR 57 | Ht 70.0 in | Wt 200.4 lb

## 2018-09-11 DIAGNOSIS — L570 Actinic keratosis: Secondary | ICD-10-CM

## 2018-09-11 DIAGNOSIS — I2510 Atherosclerotic heart disease of native coronary artery without angina pectoris: Secondary | ICD-10-CM | POA: Diagnosis not present

## 2018-09-11 DIAGNOSIS — Z23 Encounter for immunization: Secondary | ICD-10-CM | POA: Diagnosis not present

## 2018-09-11 DIAGNOSIS — I10 Essential (primary) hypertension: Secondary | ICD-10-CM

## 2018-09-11 DIAGNOSIS — M25512 Pain in left shoulder: Secondary | ICD-10-CM | POA: Diagnosis not present

## 2018-09-11 MED ORDER — AMBULATORY NON FORMULARY MEDICATION: 0 refills | Status: DC

## 2018-09-11 NOTE — Patient Instructions (Signed)
You should schedule with Dr. Dianah Field for your shoulder if you are not getting better over the next couple of weeks.

## 2018-09-11 NOTE — Progress Notes (Signed)
Subjective:    CC:   HPI:  Hypertension- Pt denies chest pain, SOB, dizziness, or heart palpitations.  Taking meds as directed w/o problems.  Denies medication side effects.    CAD - no CP or SOB.  Denies any recent chest pain.  He also complains of left shoulder pain.  He has had problems with that shoulder before his fact he had an injection in it about 3 years ago with Dr. Dianah Field.  Couple months ago he feels like he may have tweaked it and it was sore for a little while but then got better.  But then Friday he felt like there was a little swollen area on the top part of the shoulder near the [facility] joint he has been icing it and says it does feel better today.  Does not remember any injury or trauma on Friday.  He also wonders let me know that a couple weeks ago he had another episode of vertigo it happens occasionally he said he bent over to get something up out of the freezer and when he stood back up he had vertigo that lasted a couple of hours.  Also had a couple of seborrheic keratoses and a actinic keratoses on his forehead that he would like evaluated and treated today.  Past medical history, Surgical history, Family history not pertinant except as noted below, Social history, Allergies, and medications have been entered into the medical record, reviewed, and corrections made.   Review of Systems: No fevers, chills, night sweats, weight loss, chest pain, or shortness of breath.   Objective:    General: Well Developed, well nourished, and in no acute distress.  Neuro: Alert and oriented x3, extra-ocular muscles intact, sensation grossly intact.  HEENT: Normocephalic, atraumatic  Skin: Warm and dry, no rashes.  Forehead just at the hairline to the left side he has an actinic keratosis.  He has a erythematous scaly spot. Cardiac: Regular rate and rhythm, no murmurs rubs or gallops, no lower extremity edema.  Respiratory: Clear to auscultation bilaterally. Not using accessory  muscles, speaking in full sentences MSK: Left shoulder with normal range of motion strength is 5 out of 5.  Negative empty can test.  Just a little bit tender over the [facility] joint.  He has some slight weakness with liftoff test compared to his right arm.   Impression and Recommendations:    HTN - Well controlled. Continue current regimen. Follow up in  70months.   CAD - Stable.  No recent changes.  Pressure is well controlled and he continues to take an high dose statin nightly.  Left shoulder pain-exam is fairly normal though he has some weakness with liftoff test compared to his right arm.  Recommend continued conservative therapy with ice and band stretches at home.  He says he still has those from when he did PT at one point if not improving over the next month encouraged him to follow-up with Dr. Dianah Field for further evaluation.  Actinic keratosis on the left side of the forehead near the hairline. And scattered over his forehead on the left side.   Treated with cryotherapy.  Patient tolerated well.  Pneumovax 23 discussed.    Given rx for Tdap since Medicare.   Cryotherapy Procedure Note  Pre-operative Diagnosis: Actinic keratosis  Post-operative Diagnosis: same  Locations: forehead x 4 lesions   Indications: pre-cancerous lesion   Anesthesia: not required    Procedure Details  Patient informed of risks (permanent scarring, infection, light or dark discoloration,  bleeding, infection, weakness, numbness and recurrence of the lesion) and benefits of the procedure and verbal informed consent obtained.  The areas are treated with liquid nitrogen therapy, frozen until ice ball extended 1-2 mm beyond lesion, allowed to thaw, and treated again. The patient tolerated procedure well.  The patient was instructed on post-op care, warned that there may be blister formation, redness and pain. Recommend OTC analgesia as needed for  pain.  Condition: Stable  Complications: none.  Plan: 1. Instructed to keep the area dry and covered for 24-48h and clean thereafter. 2. Warning signs of infection were reviewed.   3. Recommended that the patient use OTC acetaminophen as needed for pain.  4. Return PRN

## 2018-10-14 ENCOUNTER — Encounter: Payer: Self-pay | Admitting: Emergency Medicine

## 2018-10-14 ENCOUNTER — Emergency Department (INDEPENDENT_AMBULATORY_CARE_PROVIDER_SITE_OTHER)
Admission: EM | Admit: 2018-10-14 | Discharge: 2018-10-14 | Disposition: A | Discharge status: Home / Self Care | Payer: Medicare Other | Source: Home / Self Care

## 2018-10-14 DIAGNOSIS — S90851A Superficial foreign body, right foot, initial encounter: Secondary | ICD-10-CM

## 2018-10-14 NOTE — Discharge Instructions (Signed)
°  You may gently clean the wound with warm water and mild soap.  You may also cover with a clean dry bandage until the wound has healed, about 1 week.  Please follow up with family medicine or return to urgent care if concern for infection- increased pain, redness, drainage of pus.

## 2018-10-14 NOTE — ED Provider Notes (Signed)
Joshua Warner CARE    CSN: [id number] Arrival date & time: 10/14/18  1008     History   Chief Complaint Chief Complaint  Patient presents with  . Foot Pain    HPI Joshua Warner is a 67 y.o. male.   HPI Joshua Warner is a 68 y.o. male presenting to UC with c/o Right heel pain after stepping on glass last night in his kitchen.  He tried to get it out at home but was concerned he was pushing it back further. Pain is worse when walking on it a certain way.   Pain is 5/10. Last tetanus was 2 years ago.   Past Medical History:  Diagnosis Date  . Hypertension since 2007  . Kidney stones 2009    Patient Active Problem List   Diagnosis Date Noted  . Osteoarthritis of first metatarsophalangeal (MTP) joint of right foot 05/24/2017  . Primary osteoarthritis of left hand 12/10/2016  . Coronary artery disease involving native coronary artery of native heart without angina pectoris 01/03/2016  . Status post insertion of drug eluting coronary artery stent 01/03/2016  . STEMI (ST elevation myocardial infarction) ([place]) 01/03/2016  . Complete heart block ([place]) 01/03/2016  . Syncope 01/03/2016  . Subscapularis tendinitis of left shoulder 10/25/2014  . Hyperlipidemia 08/12/2014  . BMI 28.0-28.9,adult 03/22/2014  . Extensor intersection syndrome of both wrists 08/02/2013  . Allergic reaction 02/08/2013  . Baker's cyst, left 01/11/2013  . Solitary pulmonary nodule 11/14/2012  . Arthritis of carpometacarpal joint 10/17/2012  . HYPOGONADISM 06/04/2009  . HYPERTENSION, BENIGN 06/04/2009  . GERD 06/04/2009  . ERECTILE DYSFUNCTION, ORGANIC 06/04/2009    Past Surgical History:  Procedure Laterality Date  . KNEE ARTHROSCOPY  12/31/2010   Right knee   . TONSILLECTOMY  1957       Home Medications    Prior to Admission medications   Medication Sig Start Date End Date Taking? Authorizing Provider  AMBULATORY NON FORMULARY MEDICATION Medication Name: TDAP X 1 IM 09/11/18    Joshua Marry, MD  ASPIRIN ADULT PO Take 65 mg by mouth.    [provider]  atorvastatin (LIPITOR) 80 MG tablet Take 80 mg by mouth at bedtime. 01/28/16   [provider]  b complex vitamins tablet Take 1 tablet by mouth daily.    [provider]  Glucosamine-Chondroit-Vit C-Mn (GLUCOSAMINE CHONDR 1500 COMPLX) CAPS Take by mouth daily.      [provider]  loratadine (CLARITIN) 10 MG tablet Take 10 mg by mouth daily.    [provider]  losartan (COZAAR) 50 MG tablet Take 1 tablet (50 mg total) by mouth daily. 10/07/17   Joshua Marry, MD  metoprolol tartrate (LOPRESSOR) 25 MG tablet Take 12.5 mg by mouth 2 (two) times daily. 01/05/16 01/04/17  [provider]  Multiple Vitamin (MULTIVITAMIN) tablet Take 1 tablet by mouth daily.      [provider]  nitroGLYCERIN (NITROSTAT) 0.4 MG SL tablet Place 1 tablet (0.4 mg total) under the tongue every 5 (five) minutes as needed. 09/09/17 09/09/18  Joshua Marry, MD  omeprazole (PRILOSEC) 20 MG capsule TAKE 1 CAPSULE BY MOUTH EVERY DAY 06/07/18   Joshua Marry, MD  TURMERIC PO Take 1 capsule by mouth daily.    [provider]  vitamin C (ASCORBIC ACID) 500 MG tablet Take 1,000 mg by mouth daily.    [provider]    Family History Family History  Problem Relation Age of Onset  .  Heart disease Father   . Hypertension Mother   . Prostate cancer Maternal Uncle        Deceased  . Depression Maternal Uncle   . Cancer Paternal Grandfather   . Cancer Paternal Grandmother     Social History Social History   Tobacco Use  . Smoking status: Never Smoker  . Smokeless tobacco: Never Used  Substance Use Topics  . Alcohol use: Yes    Alcohol/week: 4.0 standard drinks    Types: 4 Standard drinks or equivalent per week  . Drug use: No     Allergies   Celecoxib; Sulfa antibiotics; and Sulfonamide derivatives   Review of Systems Review of  Systems  Musculoskeletal: Positive for myalgias ( bottom Right heel). Negative for arthralgias and joint swelling.  Skin: Positive for wound. Negative for color change.  Neurological: Negative for weakness and numbness.     Physical Exam Triage Vital Signs ED Triage Vitals  Enc Vitals Group     BP 10/14/18 1042 119/80     Pulse Rate 10/14/18 1042 80     Resp --      Temp 10/14/18 1042 97.8 F (36.6 C)     Temp Source 10/14/18 1042 Oral     SpO2 10/14/18 1042 96 %     Weight 10/14/18 1043 210 lb (95.3 kg)     Height --      Head Circumference --      Peak Flow --      Pain Score 10/14/18 1043 5     Pain Loc --      Pain Edu? --      Excl. in [place]? --    No data found.  Updated Vital Signs BP 119/80 (BP Location: Right Arm)   Pulse 80   Temp 97.8 F (36.6 C) (Oral)   Wt 210 lb (95.3 kg)   SpO2 96%   BMI 30.13 kg/m   Visual Acuity Right Eye Distance:   Left Eye Distance:   Bilateral Distance:    Right Eye Near:   Left Eye Near:    Bilateral Near:     Physical Exam Vitals signs and nursing note reviewed.  Constitutional:      Appearance: He is well-developed.  HENT:     Head: Normocephalic and atraumatic.  Neck:     Musculoskeletal: Normal range of motion.  Cardiovascular:     Rate and Rhythm: Normal rate.  Pulmonary:     Effort: Pulmonary effort is normal.  Musculoskeletal: Normal range of motion.        General: Tenderness ( bottom Right heel over wound) present.  Skin:    General: Skin is warm and dry.     Capillary Refill: Capillary refill takes less than 2 seconds.     Comments: Right heel plantar aspect: 25mm puncture wound, palpable foreign body.  Neurological:     Mental Status: He is alert and oriented to person, place, and time.  Psychiatric:        Behavior: Behavior normal.      UC Treatments / Results  Labs (all labs ordered are listed, but only abnormal results are displayed) Labs Reviewed - No data to display  EKG None   Radiology No results found.  Procedures Foreign Body Removal Date/Time: 10/14/2018 2:58 PM Performed by: Noe Gens, PA-C Authorized by: Noe Gens, PA-C   Consent:    Consent obtained:  Verbal   Consent given by:  Patient   Risks discussed:  Bleeding,  infection, pain, worsening of condition and incomplete removal   Alternatives discussed:  No treatment and delayed treatment Location:    Location:  Foot   Foot location:  R heel   Depth:  Intradermal   Tendon involvement:  None Pre-procedure details:    Imaging:  None   Neurovascular status: intact     Preparation: Patient was prepped and draped in usual sterile fashion   Anesthesia (see MAR for exact dosages):    Anesthesia method:  Local infiltration   Local anesthetic:  Lidocaine 2% WITH epi Procedure type:    Procedure complexity:  Simple Procedure details:    Scalpel size:  11   Incision length:  0.5   Localization method:  Probed   Dissection of underlying tissues: no     Bloodless field: minimal bleeding.     Removal mechanism:  Forceps   Foreign bodies recovered:  1   Description:  Glass   Intact foreign body removal: yes   Post-procedure details:    Neurovascular status: intact     Confirmation:  No additional foreign bodies on visualization   Skin closure:  None   Dressing:  Antibiotic ointment and adhesive bandage   Patient tolerance of procedure:  Tolerated well, no immediate complications   (including critical care time)  Medications Ordered in UC Medications - No data to display  Initial Impression / Assessment and Plan / UC Course  I have reviewed the triage vital signs and the nursing notes.  Pertinent labs & imaging results that were available during my care of the patient were reviewed by me and considered in my medical decision making (see chart for details).     Glass in bottom of Right heel, removed as noted above Home care info provided.  Final Clinical Impressions(s) / UC  Diagnoses   Final diagnoses:  Acute foreign body of right heel, initial encounter     Discharge Instructions      You may gently clean the wound with warm water and mild soap.  You may also cover with a clean dry bandage until the wound has healed, about 1 week.  Please follow up with family medicine or return to urgent care if concern for infection- increased pain, redness, drainage of pus.     ED Prescriptions    None     Controlled Substance Prescriptions [place] Controlled Substance Registry consulted? Not Applicable   Joshua Warner 10/14/18 0045

## 2018-10-14 NOTE — ED Triage Notes (Signed)
Pt c/o stepping on glass yesterday. Pain in right heel only with walking. States he tried to get it out with no success

## 2018-11-08 ENCOUNTER — Encounter: Payer: Self-pay | Admitting: Sports Medicine

## 2018-11-08 ENCOUNTER — Ambulatory Visit (INDEPENDENT_AMBULATORY_CARE_PROVIDER_SITE_OTHER): Payer: Medicare Other | Admitting: Sports Medicine

## 2018-11-08 DIAGNOSIS — M19071 Primary osteoarthritis, right ankle and foot: Secondary | ICD-10-CM | POA: Diagnosis not present

## 2018-11-08 DIAGNOSIS — I2510 Atherosclerotic heart disease of native coronary artery without angina pectoris: Secondary | ICD-10-CM | POA: Diagnosis not present

## 2018-11-08 NOTE — Progress Notes (Signed)
Subjective:    CC: Right foot pain  HPI: This is a pleasant 68 year old male, for the past couple weeks has had increasing pain in his right first metatarsophalangeal joint.  He has osteoarthritis here, we did an injection previously about 8 months ago.  Pain is moderate, persistent, localized without radiation, no trauma, no constitutional symptoms.  I reviewed the past medical history, family history, social history, surgical history, and allergies today and no changes were needed.  Please see the problem list section below in epic for further details.  Past Medical History: Past Medical History:  Diagnosis Date  . Hypertension since 2007  . Kidney stones 2009   Past Surgical History: Past Surgical History:  Procedure Laterality Date  . KNEE ARTHROSCOPY  12/31/2010   Right knee   . TONSILLECTOMY  1957   Social History: Social History   Socioeconomic History  . Marital status: Married    Spouse name: Not on file  . Number of children: 2  . Years of education: Not on file  . Highest education level: Not on file  Occupational History  . Occupation: Retired    Fish farm manager: [place]  . Financial resource strain: Not on file  . Food insecurity:    Worry: Not on file    Inability: Not on file  . Transportation needs:    Medical: Not on file    Non-medical: Not on file  Tobacco Use  . Smoking status: Never Smoker  . Smokeless tobacco: Never Used  Substance and Sexual Activity  . Alcohol use: Yes    Alcohol/week: 4.0 standard drinks    Types: 4 Standard drinks or equivalent per week  . Drug use: No  . 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  Lifestyle  . Physical activity:    Days per week: Not on file    Minutes per session: Not on file  . Stress: Not on file  Relationships  . Social connections:    Talks  on phone: Not on file    Gets together: Not on file    Attends religious service: Not on file    Active member of club or organization: Not on file    Attends meetings of clubs or organizations: Not on file    Relationship status: Not on file  Other Topics Concern  . Not on file  Social History Narrative   No regular exercise. Retired from [employer]   Family History: Family History  Problem Relation Age of Onset  . Heart disease Father   . Hypertension Mother   . Prostate cancer Maternal Uncle        Deceased  . Depression Maternal Uncle   . Cancer Paternal Grandfather   . Cancer Paternal Grandmother    Allergies: Allergies  Allergen Reactions  . Celecoxib Nausea And Vomiting    REACTION: rash REACTION: rash  . Sulfa Antibiotics     Other reaction(s): Unknown  . Sulfonamide Derivatives     REACTION: hives   Medications: See med rec.  Review of Systems: No fevers, chills, night sweats, weight loss, chest pain, or shortness of breath.   Objective:    General: Well Developed, well nourished, and in no acute distress.  Neuro: Alert and oriented x3, extra-ocular muscles intact, sensation grossly intact.  HEENT: Normocephalic, atraumatic, pupils equal round reactive  to light, neck supple, no masses, no lymphadenopathy, thyroid nonpalpable.  Skin: Warm and dry, no rashes. Cardiac: Regular rate and rhythm, no murmurs rubs or gallops, no lower extremity edema.  Respiratory: Clear to auscultation bilaterally. Not using accessory muscles, speaking in full sentences. Right foot: No visible erythema or swelling. Range of motion is full in all directions. Strength is 5/5 in all directions. No hallux valgus. No pes cavus or pes planus. No abnormal callus noted. No pain over the navicular prominence, or base of fifth metatarsal. No tenderness to palpation of the calcaneal insertion of plantar fascia. No pain at the Achilles insertion. No pain over the calcaneal bursa. No pain  of the retrocalcaneal bursa. No tenderness to palpation over the tarsals, metatarsals, or phalanges. Hallux limitus, enlarged first MTP with a bunion. No tenderness palpation over interphalangeal joints. No pain with compression of the metatarsal heads. Neurovascularly intact distally.  Procedure: Real-time Ultrasound Guided injection of the right first metatarsophalangeal joint Device: GE Logiq E  Verbal informed consent obtained.  Time-out conducted.  Noted no overlying erythema, induration, or other signs of local infection.  Skin prepped in a sterile fashion.  Local anesthesia: Topical Ethyl chloride.  With sterile technique and under real time ultrasound guidance:  1/2 cc Kenalog 40, 1/2 cc lidocaine injected easily Completed without difficulty  Pain immediately resolved suggesting accurate placement of the medication.  Advised to call if fevers/chills, erythema, induration, drainage, or persistent bleeding.  Images permanently stored and available for review in the ultrasound unit.  Impression: Technically successful ultrasound guided injection.  Impression and Recommendations:    Osteoarthritis of first metatarsophalangeal (MTP) joint of right foot Repeat right first MTP injection today, he does have a toe spacer, previous injection was 9 months ago, return as needed.   ___________________________________________ Gwen Her. Dianah Field, M.D., ABFM., CAQSM. Primary Care and Sports Medicine [place] [facility]  Adjunct Professor of [place] [employer]

## 2018-11-08 NOTE — Assessment & Plan Note (Signed)
Repeat right first MTP injection today, he does have a toe spacer, previous injection was 9 months ago, return as needed.

## 2018-11-22 DIAGNOSIS — M542 Cervicalgia: Secondary | ICD-10-CM | POA: Diagnosis not present

## 2018-11-22 DIAGNOSIS — M545 Low back pain: Secondary | ICD-10-CM | POA: Diagnosis not present

## 2018-11-22 DIAGNOSIS — M25552 Pain in left hip: Secondary | ICD-10-CM | POA: Diagnosis not present

## 2018-11-22 DIAGNOSIS — M25551 Pain in right hip: Secondary | ICD-10-CM | POA: Diagnosis not present

## 2018-11-22 DIAGNOSIS — M9901 Segmental and somatic dysfunction of cervical region: Secondary | ICD-10-CM | POA: Diagnosis not present

## 2018-11-22 DIAGNOSIS — M9903 Segmental and somatic dysfunction of lumbar region: Secondary | ICD-10-CM | POA: Diagnosis not present

## 2018-11-22 DIAGNOSIS — M62830 Muscle spasm of back: Secondary | ICD-10-CM | POA: Diagnosis not present

## 2018-12-06 ENCOUNTER — Other Ambulatory Visit: Payer: Self-pay | Admitting: Family Medicine

## 2018-12-06 DIAGNOSIS — K219 Gastro-esophageal reflux disease without esophagitis: Secondary | ICD-10-CM

## 2018-12-13 DIAGNOSIS — M62830 Muscle spasm of back: Secondary | ICD-10-CM | POA: Diagnosis not present

## 2018-12-13 DIAGNOSIS — M25552 Pain in left hip: Secondary | ICD-10-CM | POA: Diagnosis not present

## 2018-12-13 DIAGNOSIS — M545 Low back pain: Secondary | ICD-10-CM | POA: Diagnosis not present

## 2018-12-13 DIAGNOSIS — M542 Cervicalgia: Secondary | ICD-10-CM | POA: Diagnosis not present

## 2018-12-13 DIAGNOSIS — M25551 Pain in right hip: Secondary | ICD-10-CM | POA: Diagnosis not present

## 2018-12-13 DIAGNOSIS — M9903 Segmental and somatic dysfunction of lumbar region: Secondary | ICD-10-CM | POA: Diagnosis not present

## 2018-12-13 DIAGNOSIS — M9901 Segmental and somatic dysfunction of cervical region: Secondary | ICD-10-CM | POA: Diagnosis not present

## 2018-12-22 DIAGNOSIS — E785 Hyperlipidemia, unspecified: Secondary | ICD-10-CM | POA: Diagnosis not present

## 2018-12-22 DIAGNOSIS — L550 Sunburn of first degree: Secondary | ICD-10-CM | POA: Diagnosis not present

## 2018-12-22 DIAGNOSIS — I10 Essential (primary) hypertension: Secondary | ICD-10-CM | POA: Diagnosis not present

## 2019-01-08 ENCOUNTER — Other Ambulatory Visit: Payer: Self-pay

## 2019-01-08 MED ORDER — LOSARTAN POTASSIUM 50 MG PO TABS: 50.0000 mg | ORAL_TABLET | Freq: Every day | ORAL | 1 refills | Status: DC

## 2019-01-11 ENCOUNTER — Telehealth: Payer: Self-pay

## 2019-01-11 NOTE — Telephone Encounter (Signed)
Pt takes Losartan 50 mg, he states it is on backorder  Pt requesting alternate medication. Denies ever having any interactions with BP medication in the past

## 2019-01-12 MED ORDER — VALSARTAN 80 MG PO TABS: 80.0000 mg | ORAL_TABLET | Freq: Every day | ORAL | 1 refills | Status: DC

## 2019-01-12 NOTE — Telephone Encounter (Signed)
Pt advised. RX sent to local pharmacy per pt request

## 2019-01-12 NOTE — Telephone Encounter (Signed)
Put in prescription for valsartan which is very similar.  Unfortunately several that I tried to type and said they were covered and I am not sure if it is just not accurate or if they really are not covered to the pharmacy will just have to let us know.  I pended it but was not sure if it needed to go to mail order or local

## 2019-01-15 MED ORDER — LISINOPRIL 20 MG PO TABS: 20.0000 mg | ORAL_TABLET | Freq: Every day | ORAL | 3 refills | Status: DC

## 2019-01-15 NOTE — Addendum Note (Signed)
Addended by: Beatrice Lecher D on: 01/15/2019 12:17 PM   Modules accepted: Orders

## 2019-01-15 NOTE — Telephone Encounter (Signed)
Patient advised.

## 2019-01-15 NOTE — Telephone Encounter (Signed)
Bryon called this morning stating the new blood pressure medication is causing increased urination and he feels wiped out. He would like a different medication. Please advise.

## 2019-01-15 NOTE — Telephone Encounter (Signed)
Okay, new prescription sent for lisinopril to local pharmacy.

## 2019-01-22 ENCOUNTER — Other Ambulatory Visit: Payer: Self-pay | Admitting: *Deleted

## 2019-01-22 MED ORDER — NITROGLYCERIN 0.4 MG SL SUBL: 0.4000 mg | SUBLINGUAL_TABLET | SUBLINGUAL | 1 refills | Status: DC | PRN

## 2019-02-16 ENCOUNTER — Ambulatory Visit (INDEPENDENT_AMBULATORY_CARE_PROVIDER_SITE_OTHER): Payer: Medicare Other | Admitting: Sports Medicine

## 2019-02-16 ENCOUNTER — Other Ambulatory Visit: Payer: Self-pay

## 2019-02-16 ENCOUNTER — Encounter: Payer: Self-pay | Admitting: Sports Medicine

## 2019-02-16 DIAGNOSIS — M19071 Primary osteoarthritis, right ankle and foot: Secondary | ICD-10-CM | POA: Diagnosis not present

## 2019-02-16 DIAGNOSIS — I2510 Atherosclerotic heart disease of native coronary artery without angina pectoris: Secondary | ICD-10-CM

## 2019-02-16 NOTE — Progress Notes (Signed)
Subjective:    CC: Right foot pain  HPI: This is a very pleasant 68 year old male, he has right foot hallux valgus with MTP osteoarthritis, we last injected his right first MTP in April of this year.  He is now having recurrence of pain, moderate, persistent, localized without radiation.  I reviewed the past medical history, family history, social history, surgical history, and allergies today and no changes were needed.  Please see the problem list section below in epic for further details.  Past Medical History: Past Medical History:  Diagnosis Date  . Hypertension since 2007  . Kidney stones 2009   Past Surgical History: Past Surgical History:  Procedure Laterality Date  . KNEE ARTHROSCOPY  12/31/2010   Right knee   . TONSILLECTOMY  1957   Social History: Social History   Socioeconomic History  . Marital status: Married    Spouse name: Not on file  . Number of children: 2  . Years of education: Not on file  . Highest education level: Not on file  Occupational History  . Occupation: Retired    Fish farm manager: [place]  . Financial resource strain: Not on file  . Food insecurity    Worry: Not on file    Inability: Not on file  . Transportation needs    Medical: Not on file    Non-medical: Not on file  Tobacco Use  . Smoking status: Never Smoker  . Smokeless tobacco: Never Used  Substance and Sexual Activity  . Alcohol use: Yes    Alcohol/week: 4.0 standard drinks    Types: 4 Standard drinks or equivalent per week  . Drug use: No  . 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  Lifestyle  . Physical activity    Days per week: Not on file    Minutes per session: Not on file  . Stress: Not on file  Relationships  . Social Herbalist on phone: Not on file    Gets together: Not on file    Attends  religious service: Not on file    Active member of club or organization: Not on file    Attends meetings of clubs or organizations: Not on file    Relationship status: Not on file  Other Topics Concern  . Not on file  Social History Narrative   No regular exercise. Retired from [employer]   Family History: Family History  Problem Relation Age of Onset  . Heart disease Father   . Hypertension Mother   . Prostate cancer Maternal Uncle        Deceased  . Depression Maternal Uncle   . Cancer Paternal Grandfather   . Cancer Paternal Grandmother    Allergies: Allergies  Allergen Reactions  . Celecoxib Nausea And Vomiting    REACTION: rash REACTION: rash  . Sulfa Antibiotics     Other reaction(s): Unknown  . Sulfonamide Derivatives     REACTION: hives   Medications: See med rec.  Review of Systems: No fevers, chills, night sweats, weight loss, chest pain, or shortness of breath.   Objective:    General: Well Developed, well nourished, and in no acute distress.  Neuro: Alert and oriented x3, extra-ocular muscles intact, sensation grossly intact.  HEENT: Normocephalic, atraumatic, pupils equal round reactive to light, neck supple, no masses, no  lymphadenopathy, thyroid nonpalpable.  Skin: Warm and dry, no rashes. Cardiac: Regular rate and rhythm, no murmurs rubs or gallops, no lower extremity edema.  Respiratory: Clear to auscultation bilaterally. Not using accessory muscles, speaking in full sentences. Right foot: No visible erythema or swelling. Range of motion is full in all directions. Strength is 5/5 in all directions. Hallux valgus with swelling and pain at the first MTP No pes cavus or pes planus. No abnormal callus noted. No pain over the navicular prominence, or base of fifth metatarsal. No tenderness to palpation of the calcaneal insertion of plantar fascia. No pain at the Achilles insertion. No pain over the calcaneal bursa. No pain of the retrocalcaneal bursa.  No tenderness to palpation over the tarsals, metatarsals, or phalanges. No hallux rigidus or limitus. No tenderness palpation over interphalangeal joints. No pain with compression of the metatarsal heads. Neurovascularly intact distally.  Procedure: Real-time Ultrasound Guided injection of the right first MTP Device: GE Logiq E  Verbal informed consent obtained.  Time-out conducted.  Noted no overlying erythema, induration, or other signs of local infection.  Skin prepped in a sterile fashion.  Local anesthesia: Topical Ethyl chloride.  With sterile technique and under real time ultrasound guidance:  1 cc Kenalog 40, 1/2 cc lidocaine injected easily Completed without difficulty  Pain immediately resolved suggesting accurate placement of the medication.  Advised to call if fevers/chills, erythema, induration, drainage, or persistent bleeding.  Images permanently stored and available for review in the ultrasound unit.  Impression: Technically successful ultrasound guided injection.  Impression and Recommendations:    Osteoarthritis of first metatarsophalangeal (MTP) joint of right foot First MTP osteoarthritis with hallux valgus. I did perform an ultrasound-guided first MTP injection today, these have worked very well, last injection was about 4 months ago. He does okay with occasional injections, he wears a toe separator as well which tends to help. He is interested in a consultation to discuss surgical management, I told him it would likely involve a chevron osteotomy. I would like him to touch base with Dr. Wylene Simmer.   ___________________________________________ Gwen Her. Dianah Field, M.D., ABFM., CAQSM. Primary Care and Sports Medicine [place] [facility]  Adjunct Professor of [place] [employer]

## 2019-02-16 NOTE — Assessment & Plan Note (Signed)
First MTP osteoarthritis with hallux valgus. I did perform an ultrasound-guided first MTP injection today, these have worked very well, last injection was about 4 months ago. He does okay with occasional injections, he wears a toe separator as well which tends to help. He is interested in a consultation to discuss surgical management, I told him it would likely involve a chevron osteotomy. I would like him to touch base with Dr. Wylene Simmer.

## 2019-03-01 ENCOUNTER — Other Ambulatory Visit: Payer: Self-pay | Admitting: Family Medicine

## 2019-03-01 DIAGNOSIS — K219 Gastro-esophageal reflux disease without esophagitis: Secondary | ICD-10-CM

## 2019-03-04 ENCOUNTER — Other Ambulatory Visit: Payer: Self-pay | Admitting: Family Medicine

## 2019-03-13 ENCOUNTER — Ambulatory Visit (INDEPENDENT_AMBULATORY_CARE_PROVIDER_SITE_OTHER): Payer: Medicare Other | Admitting: Family Medicine

## 2019-03-13 ENCOUNTER — Other Ambulatory Visit: Payer: Self-pay

## 2019-03-13 ENCOUNTER — Encounter: Payer: Self-pay | Admitting: Family Medicine

## 2019-03-13 VITALS — BP 120/79 | HR 68 | Temp 98.4°F | Ht 70.0 in | Wt 212.0 lb

## 2019-03-13 DIAGNOSIS — I10 Essential (primary) hypertension: Secondary | ICD-10-CM | POA: Diagnosis not present

## 2019-03-13 DIAGNOSIS — I2510 Atherosclerotic heart disease of native coronary artery without angina pectoris: Secondary | ICD-10-CM | POA: Diagnosis not present

## 2019-03-13 DIAGNOSIS — R05 Cough: Secondary | ICD-10-CM

## 2019-03-13 DIAGNOSIS — R059 Cough, unspecified: Secondary | ICD-10-CM

## 2019-03-13 DIAGNOSIS — Z23 Encounter for immunization: Secondary | ICD-10-CM | POA: Diagnosis not present

## 2019-03-13 MED ORDER — LOSARTAN POTASSIUM 50 MG PO TABS: 50.0000 mg | ORAL_TABLET | Freq: Every day | ORAL | 1 refills | Status: DC

## 2019-03-13 NOTE — Assessment & Plan Note (Signed)
Well-controlled.  Recommend switch lisinopril back to losartan which is what he was on previously.  I do feel like the lisinopril is likely contributing to his cough.  Also consider losartan still could be potentially causing some cough and we discussed that today

## 2019-03-13 NOTE — Assessment & Plan Note (Signed)
Stable.  Asymptomatic.  Continue statin, aspirin, low-dose beta-blocker.

## 2019-03-13 NOTE — Progress Notes (Signed)
Established Patient Office Visit  Subjective:  Patient ID: Joshua Warner, male    DOB: 1951-05-02  Age: 68 y.o. MRN: [id number]  CC:  Chief Complaint  Patient presents with  . Cough    HPI Joshua Warner presents for  Hypertension- Pt denies chest pain, SOB, dizziness, or heart palpitations.  Taking meds as directed w/o problems.  Denies medication side effects.    He also c/o of a cough for several months.  He was started on an ACE about 2 mo ago.    F/U CAD -history of STEMI.  Currently on aspirin, statin, and low-dose beta-blocker  Also c/o cough started about 3 days after he saw me in March.  It comes and goes and seems more prominent at night.  Feels like the cough is in his throat.has been using some OTC cough syrup and that helps some.    Past Medical History:  Diagnosis Date  . Hypertension since 2007  . Kidney stones 2009    Past Surgical History:  Procedure Laterality Date  . KNEE ARTHROSCOPY  12/31/2010   Right knee   . TONSILLECTOMY  1957    Family History  Problem Relation Age of Onset  . Heart disease Father   . Hypertension Mother   . Prostate cancer Maternal Uncle        Deceased  . Depression Maternal Uncle   . Cancer Paternal Grandfather   . Cancer Paternal Grandmother     Social History   Socioeconomic History  . Marital status: Married    Spouse name: Not on file  . Number of children: 2  . Years of education: Not on file  . Highest education level: Not on file  Occupational History  . Occupation: Retired    Fish farm manager: [employer]  . Financial resource strain: Not on file  . Food insecurity    Worry: Not on file    Inability: Not on file  . Transportation needs    Medical: Not on file    Non-medical: Not on file  Tobacco Use  . Smoking status: Never Smoker  . Smokeless tobacco: Never Used  Substance and Sexual Activity  . Alcohol use: Yes    Alcohol/week: 4.0 standard drinks    Types: 4 Standard drinks or  equivalent per week  . Drug use: No  . 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  Lifestyle  . Physical activity    Days per week: Not on file    Minutes per session: Not on file  . Stress: Not on file  Relationships  . Social Herbalist on phone: Not on file    Gets together: Not on file    Attends religious service: Not on file    Active member of club or organization: Not on file    Attends meetings of clubs or organizations: Not on file    Relationship status: Not on file  . Intimate partner violence    Fear of current or ex partner: Not on file    Emotionally abused: Not on file    Physically abused: Not on file    Forced sexual activity: Not on file  Other Topics Concern  . Not on file  Social History Narrative   No regular exercise. Retired from [employer]  Medications Prior to Visit  Medication Sig Dispense Refill  . ASPIRIN ADULT PO Take 65 mg by mouth.    Marland Kitchen atorvastatin (LIPITOR) 80 MG tablet Take 80 mg by mouth at bedtime.    Marland Kitchen b complex vitamins tablet Take 1 tablet by mouth daily.    . Glucosamine-Chondroit-Vit C-Mn (GLUCOSAMINE CHONDR 1500 COMPLX) CAPS Take by mouth daily.      Marland Kitchen loratadine (CLARITIN) 10 MG tablet Take 10 mg by mouth daily.    . metoprolol tartrate (LOPRESSOR) 25 MG tablet Take 12.5 mg by mouth 2 (two) times daily.    . Multiple Vitamin (MULTIVITAMIN) tablet Take 1 tablet by mouth daily.      . nitroGLYCERIN (NITROSTAT) 0.4 MG SL tablet PLACE 1 TABLET (0.4 MG TOTAL) UNDER THE TONGUE EVERY 5 (FIVE) MINUTES AS NEEDED. 25 tablet 1  . omeprazole (PRILOSEC) 20 MG capsule TAKE 1 CAPSULE BY MOUTH EVERY DAY 90 capsule 1  . TURMERIC PO Take 1 capsule by mouth daily.    . vitamin C (ASCORBIC ACID) 500 MG tablet Take 1,000 mg by mouth daily.    Marland Kitchen lisinopril (ZESTRIL) 20 MG tablet Take 1  tablet (20 mg total) by mouth daily. 90 tablet 3  . AMBULATORY NON FORMULARY MEDICATION Medication Name: TDAP X 1 IM 1 vial 0   No facility-administered medications prior to visit.     Allergies  Allergen Reactions  . Celecoxib Nausea And Vomiting    REACTION: rash REACTION: rash  . Sulfa Antibiotics     Other reaction(s): Unknown  . Sulfonamide Derivatives     REACTION: hives  . Lisinopril Other (See Comments)    Cough    ROS Review of Systems    Objective:    Physical Exam  Constitutional: He is oriented to person, place, and time. He appears well-developed and well-nourished.  HENT:  Head: Normocephalic and atraumatic.  Right Ear: External ear normal.  Left Ear: External ear normal.  Nose: Nose normal.  Mouth/Throat: Oropharynx is clear and moist.  TMs and canals are clear.   Eyes: Pupils are equal, round, and reactive to light. Conjunctivae and EOM are normal.  Neck: Neck supple. No thyromegaly present.  Cardiovascular: Normal rate and normal heart sounds.  Pulmonary/Chest: Effort normal and breath sounds normal.  Lymphadenopathy:    He has no cervical adenopathy.  Neurological: He is alert and oriented to person, place, and time.  Skin: Skin is warm and dry.  Psychiatric: He has a normal mood and affect.    BP 120/79   Pulse 68   Temp 98.4 F (36.9 C)   Ht 5\' 10"  (1.778 m)   Wt 212 lb (96.2 kg)   SpO2 97%   BMI 30.42 kg/m  Wt Readings from Last 3 Encounters:  03/13/19 212 lb (96.2 kg)  02/16/19 216 lb (98 kg)  11/08/18 216 lb (98 kg)     Health Maintenance Due  Topic Date Due  . TETANUS/TDAP  04/28/2018  . INFLUENZA VACCINE  01/27/2019    There are no preventive care reminders to display for this patient.  Lab Results  Component Value Date   TSH 1.05 09/05/2018   Lab Results  Component Value Date   WBC 8.1 08/28/2012   HGB 17.1 (H) 08/28/2012   HCT 48.8 08/28/2012   MCV 89.1 08/28/2012   PLT 245 08/28/2012   Lab Results  Component  Value Date   NA 137 09/05/2018   K 4.3 09/05/2018   CO2 26 09/05/2018  GLUCOSE 90 09/05/2018   BUN 16 09/05/2018   CREATININE 1.03 09/05/2018   BILITOT 0.8 09/05/2018   ALKPHOS 61 08/20/2016   AST 25 09/05/2018   ALT 23 09/05/2018   PROT 6.3 09/05/2018   ALBUMIN 4.1 08/20/2016   CALCIUM 9.4 09/05/2018   Lab Results  Component Value Date   CHOL 107 09/05/2018   Lab Results  Component Value Date   HDL 48 09/05/2018   Lab Results  Component Value Date   LDLCALC 45 09/05/2018   Lab Results  Component Value Date   TRIG 64 09/05/2018   Lab Results  Component Value Date   CHOLHDL 2.2 09/05/2018   No results found for: HGBA1C    Assessment & Plan:   Problem List Items Addressed This Visit      Cardiovascular and Mediastinum   HYPERTENSION, BENIGN - Primary    Well-controlled.  Recommend switch lisinopril back to losartan which is what he was on previously.  I do feel like the lisinopril is likely contributing to his cough.  Also consider losartan still could be potentially causing some cough and we discussed that today      Relevant Medications   losartan (COZAAR) 50 MG tablet   Coronary artery disease involving native coronary artery of native heart without angina pectoris    Stable.  Asymptomatic.  Continue statin, aspirin, low-dose beta-blocker.      Relevant Medications   losartan (COZAAR) 50 MG tablet    Other Visit Diagnoses    Cough       Need for immunization against influenza       Relevant Orders   Flu Vaccine QUAD High Dose(Fluad) (Completed)     Cough-discussed potential differential diagnosis of medication side effect versus postnasal drip versus GERD.   Meds ordered this encounter  Medications  . losartan (COZAAR) 50 MG tablet    Sig: Take 1 tablet (50 mg total) by mouth daily.    Dispense:  90 tablet    Refill:  1    Follow-up: Return in about 6 months (around 09/10/2019).    Beatrice Lecher, MD

## 2019-03-13 NOTE — Patient Instructions (Signed)
Call us back if your cough is not improving over the next 2 to 3 weeks.  Discontinue lisinopril and will change you back to losartan.

## 2019-03-26 DIAGNOSIS — Z20828 Contact with and (suspected) exposure to other viral communicable diseases: Secondary | ICD-10-CM | POA: Diagnosis not present

## 2019-04-27 DIAGNOSIS — M542 Cervicalgia: Secondary | ICD-10-CM | POA: Diagnosis not present

## 2019-04-27 DIAGNOSIS — M25552 Pain in left hip: Secondary | ICD-10-CM | POA: Diagnosis not present

## 2019-04-27 DIAGNOSIS — M545 Low back pain: Secondary | ICD-10-CM | POA: Diagnosis not present

## 2019-04-27 DIAGNOSIS — M62830 Muscle spasm of back: Secondary | ICD-10-CM | POA: Diagnosis not present

## 2019-04-27 DIAGNOSIS — M9901 Segmental and somatic dysfunction of cervical region: Secondary | ICD-10-CM | POA: Diagnosis not present

## 2019-04-27 DIAGNOSIS — M25551 Pain in right hip: Secondary | ICD-10-CM | POA: Diagnosis not present

## 2019-04-27 DIAGNOSIS — M9903 Segmental and somatic dysfunction of lumbar region: Secondary | ICD-10-CM | POA: Diagnosis not present

## 2019-05-28 DIAGNOSIS — M25552 Pain in left hip: Secondary | ICD-10-CM | POA: Diagnosis not present

## 2019-05-28 DIAGNOSIS — M62830 Muscle spasm of back: Secondary | ICD-10-CM | POA: Diagnosis not present

## 2019-05-28 DIAGNOSIS — M545 Low back pain: Secondary | ICD-10-CM | POA: Diagnosis not present

## 2019-05-28 DIAGNOSIS — M9903 Segmental and somatic dysfunction of lumbar region: Secondary | ICD-10-CM | POA: Diagnosis not present

## 2019-05-28 DIAGNOSIS — M9901 Segmental and somatic dysfunction of cervical region: Secondary | ICD-10-CM | POA: Diagnosis not present

## 2019-05-28 DIAGNOSIS — M25551 Pain in right hip: Secondary | ICD-10-CM | POA: Diagnosis not present

## 2019-05-28 DIAGNOSIS — M542 Cervicalgia: Secondary | ICD-10-CM | POA: Diagnosis not present

## 2019-06-07 ENCOUNTER — Encounter: Payer: Self-pay | Admitting: Family Medicine

## 2019-06-07 ENCOUNTER — Ambulatory Visit (INDEPENDENT_AMBULATORY_CARE_PROVIDER_SITE_OTHER): Payer: Medicare Other | Admitting: Family Medicine

## 2019-06-07 ENCOUNTER — Telehealth: Payer: Self-pay

## 2019-06-07 VITALS — BP 130/85 | Temp 97.8°F | Ht 70.0 in | Wt 214.0 lb

## 2019-06-07 DIAGNOSIS — Z20828 Contact with and (suspected) exposure to other viral communicable diseases: Secondary | ICD-10-CM | POA: Diagnosis not present

## 2019-06-07 DIAGNOSIS — R05 Cough: Secondary | ICD-10-CM | POA: Diagnosis not present

## 2019-06-07 DIAGNOSIS — Z20822 Contact with and (suspected) exposure to covid-19: Secondary | ICD-10-CM

## 2019-06-07 DIAGNOSIS — I2510 Atherosclerotic heart disease of native coronary artery without angina pectoris: Secondary | ICD-10-CM | POA: Diagnosis not present

## 2019-06-07 DIAGNOSIS — R059 Cough, unspecified: Secondary | ICD-10-CM

## 2019-06-07 MED ORDER — HYDROCODONE-HOMATROPINE 5-1.5 MG/5ML PO SYRP: 5.0000 mL | ORAL_SOLUTION | Freq: Every evening | ORAL | 0 refills | Status: DC | PRN

## 2019-06-07 MED ORDER — BENZONATATE 200 MG PO CAPS: 200.0000 mg | ORAL_CAPSULE | Freq: Two times a day (BID) | ORAL | 0 refills | Status: DC | PRN

## 2019-06-07 NOTE — Telephone Encounter (Signed)
Patient scheduled for nurse visit today.

## 2019-06-07 NOTE — Progress Notes (Signed)
Virtual Visit via Video Note  I connected with Joshua Warner on 06/07/19 at 10:50 AM EST by a video enabled telemedicine application and verified that I am speaking with the correct person using two identifiers.   I discussed the limitations of evaluation and management by telemedicine and the availability of in person appointments. The patient expressed understanding and agreed to proceed.     Acute Office Visit  Subjective:    Patient ID: Joshua Warner, male    DOB: 11-Mar-1951, 68 y.o.   MRN: [id number]  Chief Complaint  Patient presents with  . Cough    since march after PNE vaccine    HPI Patient is in today for cough that has been worse since yesterday.  Says has had a mild cough on and off since March. Says started about 1-2 weeks after his PNA shot.  He had actually had a cough prior to that and we actually had switched him from an ACE inhibitor to an ARB.  He says the cough would be mild and it would come and go but then starting yesterday the cough ramped up significantly.  He says is mostly dry but occasionally getting a little bit of phlegm.  He denies any postnasal drip.  No fever chills or sweats.  No GI upset or diarrhea.  He feels like the cough is mostly in his throat he is not had any shortness of breath or chest pain.  No body aches.  No known sick contacts but he does have his elderly mother-in-law who lives in the home and is very worried about potential for Covid as he does go into work and he is around other people.  He has to get into other people's cars.  And he did go to church this past week given even though he did wear a mask.  He has been taken Robitussin equate and says that the cough actually kept him up most of last night he only got a couple hours of sleep.  He does have a history of GERD and does take his omeprazole daily and has not noticed any recent increase in symptoms.  He also would like something called in for cough if possible.  No headache or  loss of taste or smell.  Past Medical History:  Diagnosis Date  . Hypertension since 2007  . Kidney stones 2009    Past Surgical History:  Procedure Laterality Date  . KNEE ARTHROSCOPY  12/31/2010   Right knee   . TONSILLECTOMY  1957    Family History  Problem Relation Age of Onset  . Heart disease Father   . Hypertension Mother   . Prostate cancer Maternal Uncle        Deceased  . Depression Maternal Uncle   . Cancer Paternal Grandfather   . Cancer Paternal Grandmother     Social History   Socioeconomic History  . Marital status: Married    Spouse name: Not on file  . Number of children: 2  . Years of education: Not on file  . Highest education level: Not on file  Occupational History  . Occupation: Retired    Fish farm manager: [employer]  Tobacco Use  . Smoking status: Never Smoker  . Smokeless tobacco: Never Used  Substance and Sexual Activity  . Alcohol use: Yes    Alcohol/week: 4.0 standard drinks    Types: 4 Standard drinks or equivalent per week  . Drug use: No  . 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  Other Topics Concern  . Not on file  Social History Narrative   No regular exercise. Retired from [employer] Strain:   . Difficulty of Paying Living Expenses: Not on file  Food Insecurity:   . Worried About Charity fundraiser in the Last Year: Not on file  . Ran Out of Food in the Last Year: Not on file  Transportation Needs:   . Lack of Transportation (Medical): Not on file  . Lack of Transportation (Non-Medical): Not on file  Physical Activity:   . Days of Exercise per Week: Not on file  . Minutes of Exercise per Session: Not on file  Stress:   . Feeling of Stress : Not on file  Social Connections:   . Frequency of Communication with Friends and  Family: Not on file  . Frequency of Social Gatherings with Friends and Family: Not on file  . Attends Religious Services: Not on file  . Active Member of Clubs or Organizations: Not on file  . Attends Archivist Meetings: Not on file  . Marital Status: Not on file  Intimate Partner Violence:   . Fear of Current or Ex-Partner: Not on file  . Emotionally Abused: Not on file  . Physically Abused: Not on file  . Sexually Abused: Not on file    Outpatient Medications Prior to Visit  Medication Sig Dispense Refill  . ASPIRIN ADULT PO Take 65 mg by mouth.    Marland Kitchen atorvastatin (LIPITOR) 80 MG tablet Take 80 mg by mouth at bedtime.    Marland Kitchen b complex vitamins tablet Take 1 tablet by mouth daily.    . Glucosamine-Chondroit-Vit C-Mn (GLUCOSAMINE CHONDR 1500 COMPLX) CAPS Take by mouth daily.      Marland Kitchen loratadine (CLARITIN) 10 MG tablet Take 10 mg by mouth daily.    Marland Kitchen losartan (COZAAR) 50 MG tablet Take 1 tablet (50 mg total) by mouth daily. 90 tablet 1  . metoprolol tartrate (LOPRESSOR) 25 MG tablet Take 12.5 mg by mouth 2 (two) times daily.    . Multiple Vitamin (MULTIVITAMIN) tablet Take 1 tablet by mouth daily.      . nitroGLYCERIN (NITROSTAT) 0.4 MG SL tablet PLACE 1 TABLET (0.4 MG TOTAL) UNDER THE TONGUE EVERY 5 (FIVE) MINUTES AS NEEDED. 25 tablet 1  . omeprazole (PRILOSEC) 20 MG capsule TAKE 1 CAPSULE BY MOUTH EVERY DAY 90 capsule 1  . TURMERIC PO Take 1 capsule by mouth daily.    . vitamin C (ASCORBIC ACID) 500 MG tablet Take 1,000 mg by mouth daily.     No facility-administered medications prior to visit.    Allergies  Allergen Reactions  . Celecoxib Nausea And Vomiting    REACTION: rash REACTION: rash  . Sulfa Antibiotics     Other reaction(s): Unknown  . Sulfonamide Derivatives     REACTION: hives  . Lisinopril Other (See Comments)    Cough    Review of Systems     Objective:    Physical Exam  BP 130/85   Temp 97.8 F (36.6 C)   Ht 5\' 10"  (1.778 m)   Wt 214 lb  (97.1 kg)   BMI 30.71 kg/m  Wt Readings from Last 3 Encounters:  06/07/19 214 lb (97.1 kg)  03/13/19 212 lb (96.2 kg)  02/16/19 216 lb (98  kg)    There are no preventive care reminders to display for this patient.  There are no preventive care reminders to display for this patient.   Lab Results  Component Value Date   TSH 1.05 09/05/2018   Lab Results  Component Value Date   WBC 8.1 08/28/2012   HGB 17.1 (H) 08/28/2012   HCT 48.8 08/28/2012   MCV 89.1 08/28/2012   PLT 245 08/28/2012   Lab Results  Component Value Date   NA 137 09/05/2018   K 4.3 09/05/2018   CO2 26 09/05/2018   GLUCOSE 90 09/05/2018   BUN 16 09/05/2018   CREATININE 1.03 09/05/2018   BILITOT 0.8 09/05/2018   ALKPHOS 61 08/20/2016   AST 25 09/05/2018   ALT 23 09/05/2018   PROT 6.3 09/05/2018   ALBUMIN 4.1 08/20/2016   CALCIUM 9.4 09/05/2018   Lab Results  Component Value Date   CHOL 107 09/05/2018   Lab Results  Component Value Date   HDL 48 09/05/2018   Lab Results  Component Value Date   LDLCALC 45 09/05/2018   Lab Results  Component Value Date   TRIG 64 09/05/2018   Lab Results  Component Value Date   CHOLHDL 2.2 09/05/2018   No results found for: HGBA1C     Assessment & Plan:   Problem List Items Addressed This Visit    None    Visit Diagnoses    Cough    -  Primary   Relevant Medications   benzonatate (TESSALON) 200 MG capsule   HYDROcodone-homatropine (HYCODAN) 5-1.5 MG/5ML syrup   Suspected COVID-19 virus infection         Cough-unclear etiology.  He does go to work and is around others and has been going to church.  And a with an elderly person in the home he would like to be tested which I think is a great idea considering that the positivity rate in her areas currently between 9 and 10%.  He will try to get that scheduled today if he has difficulty in doing so he will give [place] call back.  In the meantime okay to continue Robitussin.  Did send over prescription for  [facility] as well as some nighttime cough syrup.  Did warn about potential for sedation.  We also reviewed signs and symptoms for which to give [place] call back if things are changing or worsening or just not improving.  I discussed the assessment and treatment plan with the patient. The patient was provided an opportunity to ask questions and all were answered. The patient agreed with the plan and demonstrated an understanding of the instructions.   The patient was advised to call back or seek an in-person evaluation if the symptoms worsen or if the condition fails to improve as anticipated.   Meds ordered this encounter  Medications  . benzonatate (TESSALON) 200 MG capsule    Sig: Take 1 capsule (200 mg total) by mouth 2 (two) times daily as needed for cough.    Dispense:  20 capsule    Refill:  0  . HYDROcodone-homatropine (HYCODAN) 5-1.5 MG/5ML syrup    Sig: Take 5 mLs by mouth at bedtime as needed for cough.    Dispense:  120 mL    Refill:  0     Beatrice Lecher, MD

## 2019-06-07 NOTE — Telephone Encounter (Signed)
Can we put him on the nurse schedule to be drive-by swabbed this afternoon.  I really do not think he has Covid but he wants to be on the safe side because his elderly mother-in-law lives with him.

## 2019-06-07 NOTE — Addendum Note (Signed)
Addended by: Narda Rutherford on: 06/07/2019 01:13 PM   Modules accepted: Orders

## 2019-06-07 NOTE — Telephone Encounter (Signed)
Torry states he was not able to find a place to have COVID-19 testing. He was advised to call back.

## 2019-06-08 LAB — NOVEL CORONAVIRUS, NAA: SARS-CoV-2, NAA: DETECTED — AB

## 2019-06-08 NOTE — Telephone Encounter (Signed)
Joshua Warner called about his positive COVID-19 results.     People with COVID-19 have had a wide range of symptoms reported - ranging from mild symptoms to severe illness. Symptoms may appear 2-14 days after exposure to the virus. People with these symptoms may have COVID-19: . Fever or chills . Cough . Shortness of breath or difficulty breathing . Fatigue . Muscle or body aches . Headache . New loss of taste or smell . Sore throat . Congestion or runny nose . Nausea or vomiting . Diarrhea .  When to Seek Emergency Medical Attention Look for emergency warning signs* for COVID-19. If someone is showing any of these signs, seek emergency medical care immediately . Trouble breathing . Persistent pain or pressure in the chest . New confusion . Inability to wake or stay awake . Bluish lips or face  How to self-isolate  . Use a separate room and bathroom for sick household members (if possible). [name] your hands often with soap and water for at least 20 seconds, especially after blowing your nose, coughing, or sneezing; going to the bathroom; and before eating or preparing food. . If soap and water are not readily available, use an alcohol-based hand sanitizer with at least 60% alcohol. Always wash hands with soap and water if hands are visibly dirty. . Provide your sick household member with clean disposable facemasks to wear at home, if available, to help prevent spreading COVID-19 to others. . Clean the sick room and bathroom, as needed, to avoid unnecessary contact with the sick person. Marland Kitchen Avoid sharing personal items like utensils, food, and drinks.   If you feel healthy but: . Recently had close contact with a person with COVID-19 Steps to take. Stay Home and Monitor Your Health [facility]) . Stay home until 14 days after your last exposure. . Check your temperature twice a day and watch for symptoms of COVID-19. . If possible, stay away from people who are at higher-risk for  getting very sick from COVID-19.  If you: . Have been diagnosed with COVID-19, or . Are waiting for test results, or . Have cough, fever, or shortness of breath, or other symptoms of COVID-19 Steps to take. Isolate Yourself from Others (Isolation) . Stay home until it is safe to be around others. . If you live with others, stay in a specific "sick room" or area and away from other people or animals, including pets. Use a separate bathroom, if available. . Read important information about caring for yourself or someone else who is sick, including when it's safe to end home isolation.

## 2019-06-11 ENCOUNTER — Telehealth: Payer: Self-pay | Admitting: Nurse Practitioner

## 2019-06-11 NOTE — Telephone Encounter (Signed)
Called to Discuss with patient about Covid symptoms and the use of bamlanivimab, a monoclonal antibody infusion for those with mild to moderate Covid symptoms and at a high risk of hospitalization.     Pt is qualified for this infusion at the [facility] infusion center due to co-morbid conditions and/or a member of an at-risk group.    Patient Active Problem List   Diagnosis Date Noted  . Osteoarthritis of first metatarsophalangeal (MTP) joint of right foot 05/24/2017  . Primary osteoarthritis of left hand 12/10/2016  . Coronary artery disease involving native coronary artery of native heart without angina pectoris 01/03/2016  . Status post insertion of drug eluting coronary artery stent 01/03/2016  . STEMI (ST elevation myocardial infarction) ([place]) 01/03/2016  . Complete heart block ([place]) 01/03/2016  . Syncope 01/03/2016  . Subscapularis tendinitis of left shoulder 10/25/2014  . Hyperlipidemia 08/12/2014  . BMI 28.0-28.9,adult 03/22/2014  . Extensor intersection syndrome of both wrists 08/02/2013  . Allergic reaction 02/08/2013  . Baker's cyst, left 01/11/2013  . Solitary pulmonary nodule 11/14/2012  . Arthritis of carpometacarpal joint 10/17/2012  . HYPOGONADISM 06/04/2009  . HYPERTENSION, BENIGN 06/04/2009  . GERD 06/04/2009  . ERECTILE DYSFUNCTION, ORGANIC 06/04/2009      Unable to reach pt

## 2019-07-02 ENCOUNTER — Telehealth: Payer: Self-pay | Admitting: Family Medicine

## 2019-07-02 NOTE — Telephone Encounter (Signed)
Patient called and and tested positive for Covid-19 on 06/07/2019. He is wanting to know if he would need to be re-tested again. He is only having a dry nagging cough. No other symptoms. Please advise.

## 2019-07-02 NOTE — Telephone Encounter (Signed)
Patient advised and did not have any questions.

## 2019-07-02 NOTE — Telephone Encounter (Signed)
No retesting needed. OK to come off quarantine if has been 10- 14 days since test date and if feeling much better and is fever free.

## 2019-07-27 NOTE — Progress Notes (Signed)
Subjective:   Tano Marcum is a 69 y.o. male who presents for an Initial Medicare Annual Wellness Visit.  Review of Systems  No ROS.  Medicare Wellness Virtual Visit.  Visual/audio telehealth visit, UTA vital signs.   See social history for additional risk factors.    Cardiac Risk Factors include: advanced age (>47men, >72 women);hypertension;male gender;sedentary lifestyle  Sleep patterns: Getting 6 hours of sleep a night.Wakes up 1 time a night to void. Wakes up and feels rested until he had COVID and now wakes up sluggish.   Home Safety/Smoke Alarms: Feels safe in home. Smoke alarms in place.  Living environment; Lives with wife in 2 story home and stairs have hand rails on them. Shower is a step over tub combo and grab bars in place. Seat Belt Safety/Bike Helmet: Wears seat belt.  Male:   CCS- UTD    PSA-  UTD Lab Results  Component Value Date   PSA 0.8 09/05/2018   PSA 0.7 08/26/2017   PSA 0.73 08/15/2015       Objective:    Today's Vitals   07/31/19 1027  BP: 127/85  Pulse: 63  Temp: 98 F (36.7 C)  TempSrc: Oral  SpO2: 95%  Weight: 218 lb (98.9 kg)  Height: 5\' 10"  (1.778 m)   Body mass index is 31.28 kg/m.  Advanced Directives 07/31/2019 11/18/2016 08/16/2014  Does Patient Have a Medical Advance Directive? No Yes No  Does patient want to make changes to medical advance directive? - No - Patient declined -  Would patient like information on creating a medical advance directive? No - Patient declined - No - patient declined information    Current Medications (verified) Outpatient Encounter Medications as of 07/31/2019  Medication Sig  . ASPIRIN ADULT PO Take 65 mg by mouth.  Marland Kitchen atorvastatin (LIPITOR) 80 MG tablet Take 80 mg by mouth at bedtime.  Marland Kitchen b complex vitamins tablet Take 1 tablet by mouth daily.  . Cholecalciferol 125 MCG (5000 UT) capsule Take 5,000 Units by mouth daily.  . Glucosamine-Chondroit-Vit C-Mn (GLUCOSAMINE CHONDR 1500 COMPLX) CAPS Take  by mouth daily.    Marland Kitchen loratadine (CLARITIN) 10 MG tablet Take 10 mg by mouth daily.  Marland Kitchen losartan (COZAAR) 50 MG tablet Take 1 tablet (50 mg total) by mouth daily.  . metoprolol tartrate (LOPRESSOR) 25 MG tablet Take 12.5 mg by mouth 2 (two) times daily.  . Multiple Vitamin (MULTIVITAMIN) tablet Take 1 tablet by mouth daily.    . nitroGLYCERIN (NITROSTAT) 0.4 MG SL tablet PLACE 1 TABLET (0.4 MG TOTAL) UNDER THE TONGUE EVERY 5 (FIVE) MINUTES AS NEEDED.  Marland Kitchen omeprazole (PRILOSEC) 20 MG capsule TAKE 1 CAPSULE BY MOUTH EVERY DAY  . TURMERIC PO Take 1 capsule by mouth daily.  . vitamin C (ASCORBIC ACID) 500 MG tablet Take 1,000 mg by mouth daily.  . [DISCONTINUED] benzonatate (TESSALON) 200 MG capsule Take 1 capsule (200 mg total) by mouth 2 (two) times daily as needed for cough.  . [DISCONTINUED] HYDROcodone-homatropine (HYCODAN) 5-1.5 MG/5ML syrup Take 5 mLs by mouth at bedtime as needed for cough.   No facility-administered encounter medications on file as of 07/31/2019.    Allergies (verified) Celecoxib, Sulfa antibiotics, Sulfonamide derivatives, and Lisinopril   History: Past Medical History:  Diagnosis Date  . Hypertension since 2007  . Kidney stones 2009   Past Surgical History:  Procedure Laterality Date  . KNEE ARTHROSCOPY  12/31/2010   Right knee   . [place]  History  Problem Relation Age of Onset  . Heart disease Father   . Hypertension Mother   . Prostate cancer Maternal Uncle        Deceased  . Depression Maternal Uncle   . Cancer Paternal Grandfather   . Cancer Paternal Grandmother    Social History   Socioeconomic History  . Marital status: Married    Spouse name: [name]  . Number of children: 2  . Years of education: 6  . Highest education level: Some college, no degree  Occupational History  . Occupation: Retired    Fish farm manager: [employer]  Tobacco Use  . Smoking status: Never Smoker  . Smokeless tobacco: Never Used  Substance and Sexual  Activity  . Alcohol use: Yes    Alcohol/week: 1.0 standard drinks    Types: 1 Glasses of wine per week    Comment: occasionally wine  . Drug use: Never  . 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  Other Topics Concern  . Not on file  Social History Narrative   No regular exercise. Retired from [employer] Strain:   . Difficulty of Paying Living Expenses: Not on file  Food Insecurity:   . Worried About Charity fundraiser in the Last Year: Not on file  . Ran Out of Food in the Last Year: Not on file  Transportation Needs:   . Lack of Transportation (Medical): Not on file  . Lack of Transportation (Non-Medical): Not on file  Physical Activity:   . Days of Exercise per Week: Not on file  . Minutes of Exercise per Session: Not on file  Stress:   . Feeling of Stress : Not on file  Social Connections:   . Frequency of Communication with Friends and Family: Not on file  . Frequency of Social Gatherings with Friends and Family: Not on file  . Attends Religious Services: Not on file  . Active Member of Clubs or Organizations: Not on file  . Attends Archivist Meetings: Not on file  . Marital Status: Not on file   Tobacco Counseling Counseling given: Not Answered   Clinical Intake:  Pre-visit preparation completed: Yes  Pain : No/denies pain     Nutritional Risks: None Diabetes: No  How often do you need to have someone help you when you read instructions, pamphlets, or other written materials from your doctor or pharmacy?: 1 - Never What is the last grade level you completed in school?: 15  Interpreter Needed?: No  Information entered by :: Orlie Dakin, LPN  Activities of Daily Living In your present state of health, do you have any difficulty  performing the following activities: 07/31/2019  Hearing? Y  Comment wears hearing aids  Vision? N  Difficulty concentrating or making decisions? Y  Comment remembering sometimes is an issue  Walking or climbing stairs? N  Dressing or bathing? N  Doing errands, shopping? N  Preparing Food and eating ? N  Using the Toilet? N  In the past six months, have you accidently leaked urine? N  Do you have problems with loss of bowel control? N  Managing your Medications? N  Managing your Finances? N  Housekeeping or managing your Housekeeping? N  Some recent data might be hidden  Immunizations and Health Maintenance Immunization History  Administered Date(s) Administered  . Fluad Quad(high Dose 65+) 03/13/2019  . Influenza Split 03/28/2012, 03/28/2012  . Influenza Whole 04/28/2009, 03/28/2010  . Influenza, High Dose Seasonal PF 03/04/2017  . Influenza, Seasonal, Injecte, Preservative Fre 03/22/2014, 03/28/2015, 02/20/2016  . Influenza,inj,Quad PF,6+ Mos 03/22/2014, 03/28/2015, 02/20/2016, 03/13/2018  . Influenza-Unspecified 04/19/2013  . PFIZER SARS-COV-2 Vaccination 07/30/2019  . Pneumococcal Conjugate-13 03/04/2017  . Pneumococcal Polysaccharide-23 09/11/2018  . Td 04/28/2008  . Tdap 09/13/2018  . Zoster 12/14/2013   There are no preventive care reminders to display for this patient.  Patient Care Team: Hali Marry, MD as PCP - General Ralph Dowdy Gwenyth Bouillon, MD as Referring Physician (Cardiology)  Indicate any recent Medical Services you may have received from other than [facility] providers in the past year (date may be approximate).    Assessment:   This is a routine wellness examination for Sajan.Physical assessment deferred to PCP.   Hearing/Vision screen No exam data present  Dietary issues and exercise activities discussed: Current Exercise Habits: The patient does not participate in regular exercise at present, Exercise limited by: None identified Diet Eats a  fairly healthy diet- trying to cut down on portion sizes. Breakfast: Oatmeal, mini bagel with peanut butter and banana. Lunch: sandwich or soup Dinner: Meat and vegetables  Drinks 1 bottle of water daily     Goals    . Weight (lb) < 200 lb (90.7 kg)     Would like to loose at least 20 lbs.      Depression Screen PHQ 2/9 Scores 07/31/2019 09/11/2018 03/13/2018 09/09/2017  PHQ - 2 Score 0 0 0 0    Fall Risk Fall Risk  07/31/2019 03/13/2019 03/13/2018 03/04/2017  Falls in the past year? 0 0 No No  Risk for fall due to : No Fall Risks - - -  Follow up Falls prevention discussed - - -    Is the patient's home free of loose throw rugs in walkways, pet beds, electrical cords, etc?   yes      Grab bars in the bathroom? no      Handrails on the stairs?   yes      Adequate lighting?   yes   Cognitive Function:     6CIT Screen 07/31/2019  What Year? 0 points  What month? 0 points  What time? 0 points  Count back from 20 0 points  Months in reverse 0 points  Repeat phrase 0 points  Total Score 0    Screening Tests Health Maintenance  Topic Date Due  . COLONOSCOPY  10/02/2026  . TETANUS/TDAP  09/12/2028  . INFLUENZA VACCINE  Completed  . Hepatitis C Screening  Completed  . PNA vac Low Risk Adult  Completed      Plan:  Please schedule your next medicare wellness visit with me in 1 yr.  Mr. Bowlen , Thank you for taking time to come for your Medicare Wellness Visit. I appreciate your ongoing commitment to your health goals. Please review the following plan we discussed and let me know if I can assist you in the future.  Continue doing brain stimulating activities (puzzles, reading, adult coloring books, staying active) to keep memory sharp.   Encouraged to increase water intake to at least 32 ounces daily.   These are the goals we discussed: Goals    . Weight (lb) < 200 lb (90.7 kg)     Would like to loose at least 20 lbs.  This is a list of the screening recommended  for you and due dates:  Health Maintenance  Topic Date Due  . Colon Cancer Screening  10/02/2026  . Tetanus Vaccine  09/12/2028  . Flu Shot  Completed  .  Hepatitis C: One time screening is recommended by Center for Disease Control  (CDC) for  adults born from [birth year] through 1964.   Completed  . Pneumonia vaccines  Completed     I have personally reviewed and noted the following in the patient's chart:   . Medical and social history . Use of alcohol, tobacco or illicit drugs  . Current medications and supplements . Functional ability and status . Nutritional status . Physical activity . Advanced directives . List of other physicians . Hospitalizations, surgeries, and ER visits in previous 12 months . Vitals . Screenings to include cognitive, depression, and falls . Referrals and appointments  In addition, I have reviewed and discussed with patient certain preventive protocols, quality metrics, and best practice recommendations. A written personalized care plan for preventive services as well as general preventive health recommendations were provided to patient.     Joanne Chars, LPN   [id number]

## 2019-07-30 DIAGNOSIS — Z23 Encounter for immunization: Secondary | ICD-10-CM | POA: Diagnosis not present

## 2019-07-31 ENCOUNTER — Ambulatory Visit (INDEPENDENT_AMBULATORY_CARE_PROVIDER_SITE_OTHER): Payer: Medicare Other | Admitting: *Deleted

## 2019-07-31 VITALS — BP 127/85 | HR 63 | Temp 98.0°F | Ht 70.0 in | Wt 218.0 lb

## 2019-07-31 DIAGNOSIS — Z0000 Encounter for general adult medical examination without abnormal findings: Secondary | ICD-10-CM

## 2019-07-31 NOTE — Patient Instructions (Addendum)
Please schedule your next medicare wellness visit with me in 1 yr.  Mr. Go , Thank you for taking time to come for your Medicare Wellness Visit. I appreciate your ongoing commitment to your health goals. Please review the following plan we discussed and let me know if I can assist you in the future.  Continue doing brain stimulating activities (puzzles, reading, adult coloring books, staying active) to keep memory sharp.   Encouraged to increase water intake to at least 32 ounces daily.  These are the goals we discussed: Goals    . Weight (lb) < 200 lb (90.7 kg)     Would like to loose at least 20 lbs.

## 2019-08-22 DIAGNOSIS — M542 Cervicalgia: Secondary | ICD-10-CM | POA: Diagnosis not present

## 2019-08-22 DIAGNOSIS — M25511 Pain in right shoulder: Secondary | ICD-10-CM | POA: Diagnosis not present

## 2019-08-22 DIAGNOSIS — M9901 Segmental and somatic dysfunction of cervical region: Secondary | ICD-10-CM | POA: Diagnosis not present

## 2019-08-22 DIAGNOSIS — M25552 Pain in left hip: Secondary | ICD-10-CM | POA: Diagnosis not present

## 2019-08-22 DIAGNOSIS — M545 Low back pain: Secondary | ICD-10-CM | POA: Diagnosis not present

## 2019-08-22 DIAGNOSIS — M62830 Muscle spasm of back: Secondary | ICD-10-CM | POA: Diagnosis not present

## 2019-08-22 DIAGNOSIS — M9903 Segmental and somatic dysfunction of lumbar region: Secondary | ICD-10-CM | POA: Diagnosis not present

## 2019-08-27 DIAGNOSIS — Z23 Encounter for immunization: Secondary | ICD-10-CM | POA: Diagnosis not present

## 2019-09-03 ENCOUNTER — Other Ambulatory Visit: Payer: Self-pay | Admitting: Family Medicine

## 2019-09-10 ENCOUNTER — Encounter: Payer: Self-pay | Admitting: Gastroenterology

## 2019-09-10 ENCOUNTER — Other Ambulatory Visit: Payer: Self-pay

## 2019-09-10 ENCOUNTER — Ambulatory Visit (INDEPENDENT_AMBULATORY_CARE_PROVIDER_SITE_OTHER): Payer: Medicare Other | Admitting: Family Medicine

## 2019-09-10 ENCOUNTER — Encounter: Payer: Self-pay | Admitting: Family Medicine

## 2019-09-10 VITALS — BP 133/78 | HR 64 | Ht 70.0 in | Wt 224.0 lb

## 2019-09-10 DIAGNOSIS — Z125 Encounter for screening for malignant neoplasm of prostate: Secondary | ICD-10-CM | POA: Diagnosis not present

## 2019-09-10 DIAGNOSIS — R05 Cough: Secondary | ICD-10-CM | POA: Diagnosis not present

## 2019-09-10 DIAGNOSIS — K222 Esophageal obstruction: Secondary | ICD-10-CM | POA: Diagnosis not present

## 2019-09-10 DIAGNOSIS — I10 Essential (primary) hypertension: Secondary | ICD-10-CM

## 2019-09-10 DIAGNOSIS — R053 Chronic cough: Secondary | ICD-10-CM

## 2019-09-10 DIAGNOSIS — I2510 Atherosclerotic heart disease of native coronary artery without angina pectoris: Secondary | ICD-10-CM

## 2019-09-10 DIAGNOSIS — Z955 Presence of coronary angioplasty implant and graft: Secondary | ICD-10-CM

## 2019-09-10 DIAGNOSIS — Z6832 Body mass index (BMI) 32.0-32.9, adult: Secondary | ICD-10-CM | POA: Diagnosis not present

## 2019-09-10 MED ORDER — FLUTICASONE PROPIONATE 50 MCG/ACT NA SUSP: 1.0000 | Freq: Every day | NASAL | 12 refills | Status: DC

## 2019-09-10 NOTE — Patient Instructions (Signed)
Go for labs in the morning.

## 2019-09-10 NOTE — Assessment & Plan Note (Signed)
Chest pain but some shortness of breath but I think it is from having had Covid and deconditioning.

## 2019-09-10 NOTE — Progress Notes (Signed)
Established Patient Office Visit  Subjective:  Patient ID: Joshua Warner, male    DOB: October 03, 1950  Age: 69 y.o. MRN: [id number]  CC:  Chief Complaint  Patient presents with  . Hypertension    HPI Wilson Brar presents for   Hypertension- Pt denies chest pain, dizziness, or heart palpitations.  Taking meds as directed w/o problems.  Denies medication side effects.    F/ CAD -   no recent chest pain.  He has a little bit of shortness of breath mostly with activity but he thinks that this still left over from when he had Covid in December plus he is gained some weight over this last year.  Recently he has tried to start walking at one of the local parks to try to get a little bit more exercise.  He tried to go back to the gym but they required to wear masks and he said he just could not do it he felt too short of breath.  He also complains of a cough that has had for about a year usually is worse when he get first gets up in the morning and there is usually a little bit of phlegm and congestion with it.  He denies any heartburn symptoms but does have a lot of postnasal drip.  He also complains of nasal congestion especially if he lays on his left side at night.  He does have a prior history of a Schatzki's ring and had an endoscopy about 15 years ago and more recently has noticed that he is feeling like food will get a little bit stuck in the lower chest area when he eats.  Also reports more recently has started taking a supplemental zinc and it actually caused his stomach to her even stopped it and then retried it.  Past Medical History:  Diagnosis Date  . Hypertension since 2007  . Kidney stones 2009    Past Surgical History:  Procedure Laterality Date  . KNEE ARTHROSCOPY  12/31/2010   Right knee   . TONSILLECTOMY  1957    Family History  Problem Relation Age of Onset  . Heart disease Father   . Hypertension Mother   . Prostate cancer Maternal Uncle        Deceased  .  Depression Maternal Uncle   . Cancer Paternal Grandfather   . Cancer Paternal Grandmother     Social History   Socioeconomic History  . Marital status: Married    Spouse name: [name]  . Number of children: 2  . Years of education: 54  . Highest education level: Some college, no degree  Occupational History  . Occupation: Retired    Fish farm manager: [employer]  Tobacco Use  . Smoking status: Never Smoker  . Smokeless tobacco: Never Used  Substance and Sexual Activity  . Alcohol use: Yes    Alcohol/week: 1.0 standard drinks    Types: 1 Glasses of wine per week    Comment: occasionally wine  . Drug use: Never  . 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  Other Topics Concern  . Not on file  Social History Narrative   No regular exercise. Retired from [employer] Strain:   . Difficulty of Paying Living Expenses:   Food Insecurity:   . Worried  About Running Out of Food in the Last Year:   . [place] in the Last Year:   Transportation Needs:   . Lack of Transportation (Medical):   Marland Kitchen Lack of Transportation (Non-Medical):   Physical Activity:   . Days of Exercise per Week:   . Minutes of Exercise per Session:   Stress:   . Feeling of Stress :   Social Connections:   . Frequency of Communication with Friends and Family:   . Frequency of Social Gatherings with Friends and Family:   . Attends Religious Services:   . Active Member of Clubs or Organizations:   . Attends Archivist Meetings:   Marland Kitchen Marital Status:   Intimate Partner Violence:   . Fear of Current or Ex-Partner:   . Emotionally Abused:   Marland Kitchen Physically Abused:   . Sexually Abused:     Outpatient Medications Prior to Visit  Medication Sig Dispense Refill  . ASPIRIN ADULT PO Take 65 mg by mouth.     Marland Kitchen atorvastatin (LIPITOR) 80 MG tablet Take 80 mg by mouth at bedtime.    Marland Kitchen b complex vitamins tablet Take 1 tablet by mouth daily.    . Cholecalciferol 125 MCG (5000 UT) capsule Take 5,000 Units by mouth daily.    . Glucosamine-Chondroit-Vit C-Mn (GLUCOSAMINE CHONDR 1500 COMPLX) CAPS Take by mouth daily.      Marland Kitchen loratadine (CLARITIN) 10 MG tablet Take 10 mg by mouth daily.    Marland Kitchen losartan (COZAAR) 50 MG tablet TAKE 1 TABLET BY MOUTH EVERY DAY 90 tablet 0  . metoprolol tartrate (LOPRESSOR) 25 MG tablet Take 12.5 mg by mouth 2 (two) times daily.    . Multiple Vitamin (MULTIVITAMIN) tablet Take 1 tablet by mouth daily.      . nitroGLYCERIN (NITROSTAT) 0.4 MG SL tablet PLACE 1 TABLET (0.4 MG TOTAL) UNDER THE TONGUE EVERY 5 (FIVE) MINUTES AS NEEDED. 25 tablet 1  . omeprazole (PRILOSEC) 20 MG capsule TAKE 1 CAPSULE BY MOUTH EVERY DAY 90 capsule 1  . TURMERIC PO Take 1 capsule by mouth daily.    . vitamin C (ASCORBIC ACID) 500 MG tablet Take 1,000 mg by mouth daily.    . metoprolol tartrate (LOPRESSOR) 25 MG tablet Take 12.5 mg by mouth 2 (two) times daily.     No facility-administered medications prior to visit.    Allergies  Allergen Reactions  . Celecoxib Nausea And Vomiting    REACTION: rash REACTION: rash  . Sulfa Antibiotics     Other reaction(s): Unknown  . Sulfonamide Derivatives     REACTION: hives  . Lisinopril Other (See Comments)    Cough    ROS Review of Systems    Objective:    Physical Exam  Constitutional: He is oriented to person, place, and time. He appears well-developed and well-nourished.  HENT:  Head: Normocephalic and atraumatic.  Right Ear: External ear normal.  Left Ear: External ear normal.  Eyes: Conjunctivae are normal.  Cardiovascular: Normal rate, regular rhythm and normal heart sounds.  Pulmonary/Chest: Effort normal and breath sounds normal.  Neurological: He is alert and oriented to person, place, and time.  Skin: Skin is warm and dry.   Psychiatric: He has a normal mood and affect. His behavior is normal.    BP 133/78   Pulse 64   Ht 5\' 10"  (1.778 m)   Wt 224 lb (101.6 kg)   SpO2 96%   BMI 32.14 kg/m  Wt Readings from Last 3  Encounters:  09/10/19 224 lb (101.6 kg)  07/31/19 218 lb (98.9 kg)  06/07/19 214 lb (97.1 kg)     There are no preventive care reminders to display for this patient.  There are no preventive care reminders to display for this patient.  Lab Results  Component Value Date   TSH 1.05 09/05/2018   Lab Results  Component Value Date   WBC 8.1 08/28/2012   HGB 17.1 (H) 08/28/2012   HCT 48.8 08/28/2012   MCV 89.1 08/28/2012   PLT 245 08/28/2012   Lab Results  Component Value Date   NA 137 09/05/2018   K 4.3 09/05/2018   CO2 26 09/05/2018   GLUCOSE 90 09/05/2018   BUN 16 09/05/2018   CREATININE 1.03 09/05/2018   BILITOT 0.8 09/05/2018   ALKPHOS 61 08/20/2016   AST 25 09/05/2018   ALT 23 09/05/2018   PROT 6.3 09/05/2018   ALBUMIN 4.1 08/20/2016   CALCIUM 9.4 09/05/2018   Lab Results  Component Value Date   CHOL 107 09/05/2018   Lab Results  Component Value Date   HDL 48 09/05/2018   Lab Results  Component Value Date   LDLCALC 45 09/05/2018   Lab Results  Component Value Date   TRIG 64 09/05/2018   Lab Results  Component Value Date   CHOLHDL 2.2 09/05/2018   No results found for: HGBA1C    Assessment & Plan:   Problem List Items Addressed This Visit      Cardiovascular and Mediastinum   HYPERTENSION, BENIGN - Primary    Well controlled. Continue current regimen. Follow up in  6 mo. Due for labs.       Relevant Medications   metoprolol tartrate (LOPRESSOR) 25 MG tablet   Other Relevant Orders   CBC   COMPLETE METABOLIC PANEL WITH GFR   Lipid panel   Coronary artery disease involving native coronary artery of native heart without angina pectoris    Chest pain but some shortness of breath but I think it is from having had Covid and deconditioning.       Relevant Medications   metoprolol tartrate (LOPRESSOR) 25 MG tablet   Other Relevant Orders   CBC   COMPLETE METABOLIC PANEL WITH GFR   Lipid panel     Digestive   Schatzki's ring   Relevant Orders   Ambulatory referral to Gastroenterology     Other   Status post insertion of drug eluting coronary artery stent    Other Visit Diagnoses    Screening for prostate cancer       Relevant Orders   PSA   BMI 32.0-32.9,adult       Chronic cough         BMI 32-encouraged to continue to work on exercise and weight loss I think this would help with some of his shortness of breath and deconditioning.  Chronic cough-discussed potential causes including postnasal drip, GERD, or his losartan.  We can go ahead and refer him to GI for the history of Schatzki's ring he is already takes a PPI daily.  We will add Flonase to see if this helps with postnasal drip which she does complain of at night.  Also consider holding his losartan.  Meds ordered this encounter  Medications  . fluticasone (FLONASE) 50 MCG/ACT nasal spray    Sig: Place 1-2 sprays into both nostrils daily.    Dispense:  16 g    Refill:  12    Follow-up: Return in about 6  months (around 03/12/2020).    Beatrice Lecher, MD

## 2019-09-10 NOTE — Assessment & Plan Note (Signed)
Well controlled. Continue current regimen. Follow up in  6 mo. Due for labs.  

## 2019-09-11 DIAGNOSIS — I10 Essential (primary) hypertension: Secondary | ICD-10-CM | POA: Diagnosis not present

## 2019-09-11 DIAGNOSIS — Z125 Encounter for screening for malignant neoplasm of prostate: Secondary | ICD-10-CM | POA: Diagnosis not present

## 2019-09-11 DIAGNOSIS — I2510 Atherosclerotic heart disease of native coronary artery without angina pectoris: Secondary | ICD-10-CM | POA: Diagnosis not present

## 2019-09-12 LAB — COMPLETE METABOLIC PANEL WITH GFR
AG Ratio: 2 (calc) (ref 1.0–2.5)
ALT: 25 U/L (ref 9–46)
AST: 25 U/L (ref 10–35)
Albumin: 4 g/dL (ref 3.6–5.1)
Alkaline phosphatase (APISO): 69 U/L (ref 35–144)
BUN: 18 mg/dL (ref 7–25)
CO2: 24 mmol/L (ref 20–32)
Calcium: 8.8 mg/dL (ref 8.6–10.3)
Chloride: 106 mmol/L (ref 98–110)
Creat: 1.03 mg/dL (ref 0.70–1.25)
GFR, Est African American: 87 mL/min/{1.73_m2} (ref >=60)
GFR, Est Non African American: 75 mL/min/{1.73_m2} (ref >=60)
Globulin: 2 g/dL (calc) (ref 1.9–3.7)
Glucose, Bld: 98 mg/dL (ref 65–99)
Potassium: 4.3 mmol/L (ref 3.5–5.3)
Sodium: 139 mmol/L (ref 135–146)
Total Bilirubin: 0.6 mg/dL (ref 0.2–1.2)
Total Protein: 6 g/dL — ABNORMAL LOW (ref 6.1–8.1)

## 2019-09-12 LAB — CBC
HCT: 50 % (ref 38.5–50.0)
Hemoglobin: 17.2 g/dL — ABNORMAL HIGH (ref 13.2–17.1)
MCH: 31.2 pg (ref 27.0–33.0)
MCHC: 34.4 g/dL (ref 32.0–36.0)
MCV: 90.6 fL (ref 80.0–100.0)
MPV: 10.6 fL (ref 7.5–12.5)
Platelets: 204 10*3/uL (ref 140–400)
RBC: 5.52 10*6/uL (ref 4.20–5.80)
RDW: 13.2 % (ref 11.0–15.0)
WBC: 5.5 10*3/uL (ref 3.8–10.8)

## 2019-09-12 LAB — LIPID PANEL
Cholesterol: 116 mg/dL (ref <200)
HDL: 44 mg/dL (ref >=40)
LDL Cholesterol (Calc): 57 mg/dL (calc)
Non-HDL Cholesterol (Calc): 72 mg/dL (calc) (ref <130)
Total CHOL/HDL Ratio: 2.6 (calc) (ref <5.0)
Triglycerides: 71 mg/dL (ref <150)

## 2019-09-12 LAB — PSA: PSA: 0.7 ng/mL (ref <=4.0)

## 2019-09-25 ENCOUNTER — Encounter: Payer: Self-pay | Admitting: Gastroenterology

## 2019-09-25 ENCOUNTER — Other Ambulatory Visit: Payer: Self-pay

## 2019-09-25 ENCOUNTER — Ambulatory Visit (INDEPENDENT_AMBULATORY_CARE_PROVIDER_SITE_OTHER): Payer: Medicare Other | Admitting: Gastroenterology

## 2019-09-25 VITALS — BP 156/84 | HR 76 | Temp 97.3°F | Ht 70.0 in | Wt 227.0 lb

## 2019-09-25 DIAGNOSIS — R1319 Other dysphagia: Secondary | ICD-10-CM

## 2019-09-25 DIAGNOSIS — R05 Cough: Secondary | ICD-10-CM

## 2019-09-25 DIAGNOSIS — R1310 Dysphagia, unspecified: Secondary | ICD-10-CM

## 2019-09-25 DIAGNOSIS — K222 Esophageal obstruction: Secondary | ICD-10-CM | POA: Diagnosis not present

## 2019-09-25 DIAGNOSIS — Z01818 Encounter for other preprocedural examination: Secondary | ICD-10-CM | POA: Diagnosis not present

## 2019-09-25 DIAGNOSIS — K219 Gastro-esophageal reflux disease without esophagitis: Secondary | ICD-10-CM

## 2019-09-25 DIAGNOSIS — R059 Cough, unspecified: Secondary | ICD-10-CM

## 2019-09-25 NOTE — Progress Notes (Signed)
Chief Complaint:   Referring Provider:  Hali Marry, *      ASSESSMENT AND PLAN;   #1. GERD with eso dysphagia in pt with H/O Schatzki's ring in past.  #2. Chronic cough. H/O pulmonary nodule. Also on losartan (lisinopril was stopped due to cough)  #3. H/O Colonic polyps 09/2016 (at [facility]).  Advised to repeat in 3 years.  Plan: -Continue Omeprazole 20mg  po qd. -Ba swallow with barium tablet -EGD with dil.  I have discussed risks and benefits. -Therafter may need pulm consultation. -Wants to wait for colonoscopy for now.  Would like to have upper GI symptoms and pulmonary symptoms resolved before going in for colonoscopy.   HPI:    Joshua Warner is a 69 y.o. male  Cough since March 2020 after pneumonia shot.  He does have some shortness of breath.  Had CT lungs 2015 which showed a small pulmonary nodule, scarring of both lung bases.  He has not seen pulm yet.  Seen in the GI clinic with complaints of cough and dysphagia.  He has longstanding history of solid food dysphagia for 40 years, attributed to Schatzki's ring.  This is intermittent, solid food, lower chest, at times associated with cough.  He has been taking omeprazole 20 mg p.o. once a day for several years.  Had EGD with esophageal dilatation in [place] about 15 years ago.   Son in [place] got covid-was on ventilator for 42 days-Dx with ILD  Patient also had had covid 05/2019  No nausea, vomiting, heartburn, regurgitation, odynophagia.  No significant diarrhea or constipation.  No melena or hematochezia. No unintentional weight loss. No abdominal pain.   Colon: 2007/2018 multiple polyps. Rpt in 3 yrs.  Past Medical History:  Diagnosis Date  . Hypertension since 2007  . Kidney stones 2009    Past Surgical History:  Procedure Laterality Date  . KNEE ARTHROSCOPY  12/31/2010   Right knee   . TONSILLECTOMY  1957    Family History  Problem Relation Age of Onset  . Heart disease Father    . Hypertension Mother   . Prostate cancer Maternal Uncle        Deceased  . Depression Maternal Uncle   . Cancer Paternal Grandfather   . Cancer Paternal Grandmother   . Colon cancer Neg Hx     Social History   Tobacco Use  . Smoking status: Never Smoker  . Smokeless tobacco: Never Used  Substance Use Topics  . Alcohol use: Yes    Alcohol/week: 1.0 standard drinks    Types: 1 Glasses of wine per week    Comment: occasionally wine  . Drug use: Never    Current Outpatient Medications  Medication Sig Dispense Refill  . ASPIRIN ADULT PO Take 65 mg by mouth.    Marland Kitchen atorvastatin (LIPITOR) 80 MG tablet Take 80 mg by mouth at bedtime.    Marland Kitchen b complex vitamins tablet Take 1 tablet by mouth daily.    . Cholecalciferol 125 MCG (5000 UT) capsule Take 5,000 Units by mouth daily.    . fluticasone (FLONASE) 50 MCG/ACT nasal spray Place 1-2 sprays into both nostrils daily. 16 g 12  . Glucosamine-Chondroit-Vit C-Mn (GLUCOSAMINE CHONDR 1500 COMPLX) CAPS Take by mouth daily.      Marland Kitchen loratadine (CLARITIN) 10 MG tablet Take 10 mg by mouth daily.    Marland Kitchen losartan (COZAAR) 50 MG tablet TAKE 1 TABLET BY MOUTH EVERY DAY 90 tablet 0  . metoprolol tartrate (LOPRESSOR) 25 MG  tablet Take 12.5 mg by mouth 2 (two) times daily.    . Multiple Vitamin (MULTIVITAMIN) tablet Take 1 tablet by mouth daily.      . nitroGLYCERIN (NITROSTAT) 0.4 MG SL tablet PLACE 1 TABLET (0.4 MG TOTAL) UNDER THE TONGUE EVERY 5 (FIVE) MINUTES AS NEEDED. 25 tablet 1  . omeprazole (PRILOSEC) 20 MG capsule TAKE 1 CAPSULE BY MOUTH EVERY DAY 90 capsule 1  . vitamin C (ASCORBIC ACID) 500 MG tablet Take 1,000 mg by mouth daily.     No current facility-administered medications for this visit.    Allergies  Allergen Reactions  . Celecoxib Nausea And Vomiting    REACTION: rash REACTION: rash  . Sulfa Antibiotics     Other reaction(s): Unknown  . Sulfonamide Derivatives     REACTION: hives  . Lisinopril Other (See Comments)    Cough     Review of Systems:  Constitutional: Denies fever, chills, diaphoresis, appetite change and fatigue.  HEENT: Denies photophobia, eye pain, redness, hearing loss, ear pain, congestion, sore throat, rhinorrhea, sneezing, mouth sores, neck pain, neck stiffness and tinnitus.   Respiratory: Has SOB, DOE, cough, No chest tightness,  and wheezing.   Cardiovascular: Denies chest pain, palpitations and leg swelling.  Genitourinary: Denies dysuria, urgency, frequency, hematuria, flank pain and difficulty urinating.  Musculoskeletal: Denies myalgias, back pain, joint swelling, arthralgias and gait problem.  Skin: No rash.  Neurological: Denies dizziness, seizures, syncope, weakness, light-headedness, numbness and headaches.  Hematological: Denies adenopathy. Easy bruising, personal or family bleeding history  Psychiatric/Behavioral: No anxiety or depression     Physical Exam:    BP (!) 156/84   Pulse 76   Temp (!) 97.3 F (36.3 C)   Ht 5\' 10"  (1.778 m)   Wt 227 lb (103 kg)   BMI 32.57 kg/m  Wt Readings from Last 3 Encounters:  09/25/19 227 lb (103 kg)  09/10/19 224 lb (101.6 kg)  07/31/19 218 lb (98.9 kg)   Constitutional:  Well-developed, in no acute distress. Psychiatric: Normal mood and affect. Behavior is normal. HEENT: Pupils normal.  Conjunctivae are normal. No scleral icterus. Neck supple.  Cardiovascular: Normal rate, regular rhythm. No edema Pulmonary/chest: Effort normal and breath sounds normal.  no wheezing, rales or rhonchi. Abdominal: Soft, nondistended. Nontender. Bowel sounds active throughout. There are no masses palpable. No hepatomegaly. Rectal:  defered Neurological: Alert and oriented to person place and time. Skin: Skin is warm and dry. No rashes noted.  Data Reviewed: I have personally reviewed following labs and imaging studies  CBC: CBC Latest Ref Rng & Units 09/11/2019 08/28/2012 07/10/2012  WBC 3.8 - 10.8 Thousand/uL 5.5 8.1 8.1  Hemoglobin 13.2 - 17.1 g/dL  17.2(H) 17.1(H) 17.8(H)  Hematocrit 38.5 - 50.0 % 50.0 48.8 50.1  Platelets 140 - 400 Thousand/uL 204 245 248    CMP: CMP Latest Ref Rng & Units 09/11/2019 09/05/2018 03/16/2018  Glucose 65 - 99 mg/dL 98 90 98  BUN 7 - 25 mg/dL 18 16 22   Creatinine 0.70 - 1.25 mg/dL 1.03 1.03 1.16  Sodium 135 - 146 mmol/L 139 137 138  Potassium 3.5 - 5.3 mmol/L 4.3 4.3 4.3  Chloride 98 - 110 mmol/L 106 104 103  CO2 20 - 32 mmol/L 24 26 24   Calcium 8.6 - 10.3 mg/dL 8.8 9.4 9.1  Total Protein 6.1 - 8.1 g/dL 6.0(L) 6.3 -  Total Bilirubin 0.2 - 1.2 mg/dL 0.6 0.8 -  Alkaline Phos 40 - 115 U/L - - -  AST 10 -  35 U/L 25 25 -  ALT 9 - 46 U/L 25 23 -      Carmell Austria, MD 09/25/2019, 2:07 PM  Cc: Hali Marry, *

## 2019-09-25 NOTE — Patient Instructions (Addendum)
If you are age 68 or older, your body mass index should be between 23-30. Your Body mass index is 32.57 kg/m. If this is out of the aforementioned range listed, please consider follow up with your Primary Care Provider.  If you are age 57 or younger, your body mass index should be between 19-25. Your Body mass index is 32.57 kg/m. If this is out of the aformentioned range listed, please consider follow up with your Primary Care Provider.   You have been scheduled for a Barium Esophogram at [facility] (1st floor of the hospital) on 10/01/19 at 9:30am. Please arrive 15 minutes prior to your appointment for registration. Make certain not to have anything to eat or drink 3 hours prior to your test. If you need to reschedule for any reason, please contact radiology at [phone] to do so. __________________________________________________________________ A barium swallow is an examination that concentrates on views of the esophagus. This tends to be a double contrast exam (barium and two liquids which, when combined, create a gas to distend the wall of the oesophagus) or single contrast (non-ionic iodine based). The study is usually tailored to your symptoms so a good history is essential. Attention is paid during the study to the form, structure and configuration of the esophagus, looking for functional disorders (such as aspiration, dysphagia, achalasia, motility and reflux) EXAMINATION You may be asked to change into a gown, depending on the type of swallow being performed. A radiologist and radiographer will perform the procedure. The radiologist will advise you of the type of contrast selected for your procedure and direct you during the exam. You will be asked to stand, sit or lie in several different positions and to hold a small amount of fluid in your mouth before being asked to swallow while the imaging is performed .In some instances you may be asked to swallow barium coated marshmallows to  assess the motility of a solid food bolus. The exam can be recorded as a digital or video fluoroscopy procedure. POST PROCEDURE It will take 1-2 days for the barium to pass through your system. To facilitate this, it is important, unless otherwise directed, to increase your fluids for the next 24-48hrs and to resume your normal diet.  This test typically takes about 30 minutes to perform. __________________________________________________________________________________   Thank you,  Dr. Jackquline Denmark

## 2019-09-25 NOTE — Addendum Note (Signed)
Addended by: Karena Addison on: 09/25/2019 03:43 PM   Modules accepted: Orders

## 2019-09-28 ENCOUNTER — Ambulatory Visit (HOSPITAL_COMMUNITY): Payer: Medicare Other

## 2019-10-01 ENCOUNTER — Ambulatory Visit (HOSPITAL_COMMUNITY)
Admission: RE | Admit: 2019-10-01 | Discharge: 2019-10-01 | Disposition: A | Discharge status: Home / Self Care | Payer: Medicare Other | Source: Ambulatory Visit | Attending: Gastroenterology | Admitting: Gastroenterology

## 2019-10-01 ENCOUNTER — Other Ambulatory Visit: Payer: Self-pay

## 2019-10-01 DIAGNOSIS — R1310 Dysphagia, unspecified: Secondary | ICD-10-CM | POA: Diagnosis not present

## 2019-10-01 DIAGNOSIS — K222 Esophageal obstruction: Secondary | ICD-10-CM | POA: Diagnosis not present

## 2019-10-01 DIAGNOSIS — K219 Gastro-esophageal reflux disease without esophagitis: Secondary | ICD-10-CM

## 2019-10-01 DIAGNOSIS — R059 Cough, unspecified: Secondary | ICD-10-CM

## 2019-10-01 DIAGNOSIS — R1319 Other dysphagia: Secondary | ICD-10-CM

## 2019-10-01 DIAGNOSIS — R05 Cough: Secondary | ICD-10-CM | POA: Insufficient documentation

## 2019-10-05 ENCOUNTER — Other Ambulatory Visit: Payer: Self-pay | Admitting: Gastroenterology

## 2019-10-05 ENCOUNTER — Ambulatory Visit (INDEPENDENT_AMBULATORY_CARE_PROVIDER_SITE_OTHER): Payer: Medicare Other

## 2019-10-05 DIAGNOSIS — Z1159 Encounter for screening for other viral diseases: Secondary | ICD-10-CM | POA: Diagnosis not present

## 2019-10-06 LAB — SARS CORONAVIRUS 2 (TAT 6-24 HRS): SARS Coronavirus 2: NEGATIVE

## 2019-10-09 ENCOUNTER — Encounter: Payer: Medicare Other | Admitting: Gastroenterology

## 2019-10-09 ENCOUNTER — Ambulatory Visit (AMBULATORY_SURGERY_CENTER): Payer: Medicare Other | Admitting: Gastroenterology

## 2019-10-09 ENCOUNTER — Other Ambulatory Visit: Payer: Self-pay

## 2019-10-09 ENCOUNTER — Encounter: Payer: Self-pay | Admitting: Gastroenterology

## 2019-10-09 VITALS — BP 146/91 | HR 64 | Temp 96.6°F | Resp 13 | Ht 70.0 in | Wt 227.0 lb

## 2019-10-09 DIAGNOSIS — K208 Other esophagitis without bleeding: Secondary | ICD-10-CM | POA: Diagnosis not present

## 2019-10-09 DIAGNOSIS — Z955 Presence of coronary angioplasty implant and graft: Secondary | ICD-10-CM | POA: Diagnosis not present

## 2019-10-09 DIAGNOSIS — K2950 Unspecified chronic gastritis without bleeding: Secondary | ICD-10-CM

## 2019-10-09 DIAGNOSIS — K449 Diaphragmatic hernia without obstruction or gangrene: Secondary | ICD-10-CM

## 2019-10-09 DIAGNOSIS — K3189 Other diseases of stomach and duodenum: Secondary | ICD-10-CM | POA: Diagnosis not present

## 2019-10-09 DIAGNOSIS — R1310 Dysphagia, unspecified: Secondary | ICD-10-CM

## 2019-10-09 DIAGNOSIS — I2510 Atherosclerotic heart disease of native coronary artery without angina pectoris: Secondary | ICD-10-CM | POA: Diagnosis not present

## 2019-10-09 DIAGNOSIS — K219 Gastro-esophageal reflux disease without esophagitis: Secondary | ICD-10-CM | POA: Diagnosis not present

## 2019-10-09 DIAGNOSIS — R1319 Other dysphagia: Secondary | ICD-10-CM

## 2019-10-09 DIAGNOSIS — I10 Essential (primary) hypertension: Secondary | ICD-10-CM | POA: Diagnosis not present

## 2019-10-09 DIAGNOSIS — K222 Esophageal obstruction: Secondary | ICD-10-CM

## 2019-10-09 DIAGNOSIS — R05 Cough: Secondary | ICD-10-CM | POA: Diagnosis not present

## 2019-10-09 MED ORDER — PANTOPRAZOLE SODIUM 40 MG PO TBEC: 40.0000 mg | DELAYED_RELEASE_TABLET | Freq: Every day | ORAL | 11 refills | Status: DC

## 2019-10-09 MED ORDER — SODIUM CHLORIDE 0.9 % IV SOLN: 500.0000 mL | Freq: Once | INTRAVENOUS | Status: DC

## 2019-10-09 NOTE — Progress Notes (Signed)
Report to PACU, RN, vss, BBS= Clear.  

## 2019-10-09 NOTE — Progress Notes (Signed)
Vitals-DT Temp-LC  Pt's states no medical or surgical changes since previsit or office visit.  Reviewed.

## 2019-10-09 NOTE — Progress Notes (Signed)
Called to room to assist during endoscopic procedure.  Patient ID and intended procedure confirmed with present staff. Received instructions for my participation in the procedure from the performing physician.  

## 2019-10-09 NOTE — Op Note (Signed)
[facility] Patient Name: Joshua Warner Procedure Date: 10/09/2019 2:57 PM MRN: [id number] Endoscopist: Jackquline Denmark , MD Age: 69 Referring MD:  Date of Birth: 01-05-1951 Gender: Male Account #: [id number] Procedure:                Upper GI endoscopy Indications:              Dysphagia Medicines:                Monitored Anesthesia Care Procedure:                Pre-Anesthesia Assessment:                           - Prior to the procedure, a History and Physical                            was performed, and patient medications and                            allergies were reviewed. The patient's tolerance of                            previous anesthesia was also reviewed. The risks                            and benefits of the procedure and the sedation                            options and risks were discussed with the patient.                            All questions were answered, and informed consent                            was obtained. Prior Anticoagulants: The patient has                            taken no previous anticoagulant or antiplatelet                            agents. ASA Grade Assessment: II - A patient with                            mild systemic disease. After reviewing the risks                            and benefits, the patient was deemed in                            satisfactory condition to undergo the procedure.                           After obtaining informed consent, the endoscope was  passed under direct vision. Throughout the                            procedure, the patient's blood pressure, pulse, and                            oxygen saturations were monitored continuously. The                            Endoscope was introduced through the mouth, and                            advanced to the second part of duodenum. The upper                            GI endoscopy was accomplished without  difficulty.                            The patient tolerated the procedure well. Scope In: Scope Out: Findings:                 The esophagus was tortuous in the distal 1/4 th                            with Schatzki ring at [place], 38 cm from the                            incisors with luminal diameter of apppox 12 mm.                            Biopsies were obtained from the proximal and distal                            esophagus with cold forceps to r/o EoE. The scope                            was withdrawn. Dilation was performed with a                            Maloney dilator with mild resistance at 50 Fr and                            52 Fr.                           A 2 cm hiatal hernia was present. The retroflexed                            examination of the cardia revealed GE junction flap                            to be Hill's grade IV.  Localized mild inflammation characterized by                            erythema was found in the gastric antrum with                            slightly deformed pylorus. No active ulcers.                            Biopsies were taken with a cold forceps for                            histology.                           The examined duodenum was normal. Complications:            No immediate complications. Estimated Blood Loss:     Estimated blood loss: none. Impression:               - Moderate Schatzki ring. Biopsied. Dilated.                           - 2 cm hiatal hernia.                           - Mild gastritis. Recommendation:           - Patient has a contact number available for                            emergencies. The signs and symptoms of potential                            delayed complications were discussed with the                            patient. Return to normal activities tomorrow.                            Written discharge instructions were provided to the                             patient.                           - Post dilatation diet.                           - Nonpharmacologic means of reflux control.                           - Change omeprazole to Protonix 40 mg p.o. QD, #30,                            11 refills.                           -  Await pathology results.                           - Chew food especially meats and breads well and                            eat slowly                           - Return to GI clinic PRN.                           - Do recommend colonoscopy due to previous H/O                            colonic polyps. Jackquline Denmark, MD 10/09/2019 3:44:14 PM This report has been signed electronically.

## 2019-10-09 NOTE — Patient Instructions (Signed)
Please read handouts provided. Post Dilation Diet. Anti-reflux measures. Change omeprazole to Protonix 40 mg everyday. Await pathology results. Return to GI clinic as needed. Chew food especially meats and bread well and eat slowly.       YOU HAD AN ENDOSCOPIC PROCEDURE TODAY AT [facility]:   Refer to the procedure report that was given to you for any specific questions about what was found during the examination.  If the procedure report does not answer your questions, please call your gastroenterologist to clarify.  If you requested that your care partner not be given the details of your procedure findings, then the procedure report has been included in a sealed envelope for you to review at your convenience later.  YOU SHOULD EXPECT: Some feelings of bloating in the abdomen. Passage of more gas than usual.  Walking can help get rid of the air that was put into your GI tract during the procedure and reduce the bloating. If you had a lower endoscopy (such as a colonoscopy or flexible sigmoidoscopy) you may notice spotting of blood in your stool or on the toilet paper. If you underwent a bowel prep for your procedure, you may not have a normal bowel movement for a few days.  Please Note:  You might notice some irritation and congestion in your nose or some drainage.  This is from the oxygen used during your procedure.  There is no need for concern and it should clear up in a day or so.  SYMPTOMS TO REPORT IMMEDIATELY:    Following upper endoscopy (EGD)  Vomiting of blood or coffee ground material  New chest pain or pain under the shoulder blades  Painful or persistently difficult swallowing  New shortness of breath  Fever of 100F or higher  Black, tarry-looking stools  For urgent or emergent issues, a gastroenterologist can be reached at any hour by calling [phone]. Do not use MyChart messaging for urgent concerns.    DIET:    Drink plenty of fluids but  you should avoid alcoholic beverages for 24 hours.  ACTIVITY:  You should plan to take it easy for the rest of today and you should NOT DRIVE or use heavy machinery until tomorrow (because of the sedation medicines used during the test).    FOLLOW UP: Our staff will call the number listed on your records 48-72 hours following your procedure to check on you and address any questions or concerns that you may have regarding the information given to you following your procedure. If we do not reach you, we will leave a message.  We will attempt to reach you two times.  During this call, we will ask if you have developed any symptoms of COVID 19. If you develop any symptoms (ie: fever, flu-like symptoms, shortness of breath, cough etc.) before then, please call [phone].  If you test positive for Covid 19 in the 2 weeks post procedure, please call and report this information to [place].    If any biopsies were taken you will be contacted by phone or by letter within the next 1-3 weeks.  Please call us at [phone] if you have not heard about the biopsies in 3 weeks.    SIGNATURES/CONFIDENTIALITY: You and/or your care partner have signed paperwork which will be entered into your electronic medical record.  These signatures attest to the fact that that the information above on your After Visit Summary has been reviewed and is understood.  Full responsibility of  the confidentiality of this discharge information lies with you and/or your care-partner. 

## 2019-10-11 ENCOUNTER — Telehealth: Payer: Self-pay

## 2019-10-11 NOTE — Telephone Encounter (Signed)
  Follow up Call-  Call back number 10/09/2019  Post procedure Call Back phone  # [phone]  Permission to leave phone message Yes  Some recent data might be hidden     Patient questions:  Do you have a fever, pain , or abdominal swelling? No. Pain Score  0 *  Have you tolerated food without any problems? Yes.    Have you been able to return to your normal activities? Yes.    Do you have any questions about your discharge instructions: Diet   No. Medications  No. Follow up visit  No.  Do you have questions or concerns about your Care? No.  Actions: * If pain score is 4 or above: No action needed, pain <4.  1. Have you developed a fever since your procedure? no  2.   Have you had an respiratory symptoms (SOB or cough) since your procedure? no  3.   Have you tested positive for COVID 19 since your procedure no  4.   Have you had any family members/close contacts diagnosed with the COVID 19 since your procedure?  no   If yes to any of these questions please route to Joylene John, RN and Erenest Rasher, RN

## 2019-10-14 ENCOUNTER — Encounter: Payer: Self-pay | Admitting: Gastroenterology

## 2019-10-21 ENCOUNTER — Encounter: Payer: Self-pay | Admitting: Family Medicine

## 2019-10-21 DIAGNOSIS — R911 Solitary pulmonary nodule: Secondary | ICD-10-CM

## 2019-10-21 DIAGNOSIS — R059 Cough, unspecified: Secondary | ICD-10-CM

## 2019-10-21 DIAGNOSIS — R05 Cough: Secondary | ICD-10-CM

## 2019-10-23 NOTE — Telephone Encounter (Signed)
Referral pended

## 2019-10-24 ENCOUNTER — Telehealth: Payer: Self-pay | Admitting: Gastroenterology

## 2019-10-24 ENCOUNTER — Encounter: Payer: Self-pay | Admitting: Family Medicine

## 2019-10-24 NOTE — Telephone Encounter (Signed)
Spoke to patient colonoscopy has been scheduled with a per visit first

## 2019-10-26 ENCOUNTER — Encounter: Payer: Self-pay | Admitting: Sports Medicine

## 2019-10-26 ENCOUNTER — Ambulatory Visit (INDEPENDENT_AMBULATORY_CARE_PROVIDER_SITE_OTHER): Payer: Medicare Other | Admitting: Sports Medicine

## 2019-10-26 ENCOUNTER — Other Ambulatory Visit: Payer: Self-pay | Admitting: Family Medicine

## 2019-10-26 DIAGNOSIS — I2510 Atherosclerotic heart disease of native coronary artery without angina pectoris: Secondary | ICD-10-CM | POA: Diagnosis not present

## 2019-10-26 DIAGNOSIS — K219 Gastro-esophageal reflux disease without esophagitis: Secondary | ICD-10-CM

## 2019-10-26 DIAGNOSIS — M19071 Primary osteoarthritis, right ankle and foot: Secondary | ICD-10-CM | POA: Diagnosis not present

## 2019-10-26 NOTE — Progress Notes (Signed)
    Procedures performed today:    Procedure: Real-time Ultrasound Guided injection of the right first metatarsophalangeal joint Device: Samsung HS60  Verbal informed consent obtained.  Time-out conducted.  Noted no overlying erythema, induration, or other signs of local infection.  Skin prepped in a sterile fashion.  Local anesthesia: Topical Ethyl chloride.  With sterile technique and under real time ultrasound guidance: 1 cc Kenalog 40, 1/2 cc lidocaine injected easily  completed without difficulty  Pain immediately resolved suggesting accurate placement of the medication.  Advised to call if fevers/chills, erythema, induration, drainage, or persistent bleeding.  Images permanently stored and available for review in the ultrasound unit.  Impression: Technically successful ultrasound guided injection.  Independent interpretation of notes and tests performed by another provider:   None.  Brief History, Exam, Impression, and Recommendations:    Osteoarthritis of first metatarsophalangeal (MTP) joint of right foot This is a pleasant 68 year old male, he has known right first MTP osteoarthritis, last injected 9 months ago, having recurrence of pain, injected again today, he does wear toe spacers to realign his first MTP which seems helpful as well. Return as needed.    ___________________________________________ Gwen Her. Dianah Field, M.D., ABFM., CAQSM. Primary Care and [place] In[employer] of [employer] [facility] [employer]

## 2019-10-26 NOTE — Assessment & Plan Note (Signed)
This is a pleasant 69 year old male, he has known right first MTP osteoarthritis, last injected 9 months ago, having recurrence of pain, injected again today, he does wear toe spacers to realign his first MTP which seems helpful as well. Return as needed.

## 2019-10-29 ENCOUNTER — Other Ambulatory Visit: Payer: Self-pay

## 2019-10-29 ENCOUNTER — Ambulatory Visit (INDEPENDENT_AMBULATORY_CARE_PROVIDER_SITE_OTHER): Payer: Medicare Other

## 2019-10-29 ENCOUNTER — Encounter: Payer: Self-pay | Admitting: Family Medicine

## 2019-10-29 DIAGNOSIS — R911 Solitary pulmonary nodule: Secondary | ICD-10-CM

## 2019-10-29 DIAGNOSIS — R059 Cough, unspecified: Secondary | ICD-10-CM

## 2019-10-29 DIAGNOSIS — R05 Cough: Secondary | ICD-10-CM

## 2019-11-06 ENCOUNTER — Other Ambulatory Visit: Payer: Self-pay

## 2019-11-06 ENCOUNTER — Ambulatory Visit (AMBULATORY_SURGERY_CENTER): Payer: Self-pay | Admitting: *Deleted

## 2019-11-06 VITALS — Temp 96.9°F | Ht 70.0 in | Wt 224.0 lb

## 2019-11-06 DIAGNOSIS — Z86010 Personal history of colonic polyps: Secondary | ICD-10-CM

## 2019-11-06 NOTE — Progress Notes (Signed)
Pt reports no recent chest pain.  He is scheduled to have an Echo cardiagram tomorrrow 11/07/19.  Advised to call us with any updates.   No egg or soy allergy known to patient  No issues with past sedation with any surgeries  or procedures, no intubation problems  No diet pills per patient No home 02 use per patient  No blood thinners per patient  Pt denies issues with constipation  No A fib or A flutter  EMMI video sent to pt's e mail   Due to the COVID-19 pandemic we are asking patients to follow these guidelines. Please only bring one care partner. Please be aware that your care partner may wait in the car in the parking lot or if they feel like they will be too hot to wait in the car, they may wait in the lobby on the 4th floor. All care partners are required to wear a mask the entire time (we do not have any that we can provide them), they need to practice social distancing, and we will do a Covid check for all patient's and care partners when you arrive. Also we will check their temperature and your temperature. If the care partner waits in their car they need to stay in the parking lot the entire time and we will call them on their cell phone when the patient is ready for discharge so they can bring the car to the front of the building. Also all patient's will need to wear a mask into building.

## 2019-11-07 DIAGNOSIS — I2510 Atherosclerotic heart disease of native coronary artery without angina pectoris: Secondary | ICD-10-CM | POA: Diagnosis not present

## 2019-11-07 DIAGNOSIS — Z955 Presence of coronary angioplasty implant and graft: Secondary | ICD-10-CM | POA: Diagnosis not present

## 2019-11-12 ENCOUNTER — Encounter: Payer: Medicare Other | Admitting: Gastroenterology

## 2019-11-12 DIAGNOSIS — M545 Low back pain: Secondary | ICD-10-CM | POA: Diagnosis not present

## 2019-11-12 DIAGNOSIS — M62830 Muscle spasm of back: Secondary | ICD-10-CM | POA: Diagnosis not present

## 2019-11-12 DIAGNOSIS — M9903 Segmental and somatic dysfunction of lumbar region: Secondary | ICD-10-CM | POA: Diagnosis not present

## 2019-11-12 DIAGNOSIS — M25511 Pain in right shoulder: Secondary | ICD-10-CM | POA: Diagnosis not present

## 2019-11-12 DIAGNOSIS — M25552 Pain in left hip: Secondary | ICD-10-CM | POA: Diagnosis not present

## 2019-11-12 DIAGNOSIS — M542 Cervicalgia: Secondary | ICD-10-CM | POA: Diagnosis not present

## 2019-11-12 DIAGNOSIS — M9901 Segmental and somatic dysfunction of cervical region: Secondary | ICD-10-CM | POA: Diagnosis not present

## 2019-11-20 ENCOUNTER — Ambulatory Visit (INDEPENDENT_AMBULATORY_CARE_PROVIDER_SITE_OTHER): Payer: Medicare Other | Admitting: Internal Medicine

## 2019-11-20 ENCOUNTER — Encounter: Payer: Self-pay | Admitting: Internal Medicine

## 2019-11-20 ENCOUNTER — Other Ambulatory Visit: Payer: Self-pay

## 2019-11-20 VITALS — BP 124/82 | HR 71 | Temp 98.2°F | Wt 224.6 lb

## 2019-11-20 DIAGNOSIS — J329 Chronic sinusitis, unspecified: Secondary | ICD-10-CM

## 2019-11-20 DIAGNOSIS — J387 Other diseases of larynx: Secondary | ICD-10-CM

## 2019-11-20 DIAGNOSIS — R0609 Other forms of dyspnea: Secondary | ICD-10-CM

## 2019-11-20 DIAGNOSIS — J986 Disorders of diaphragm: Secondary | ICD-10-CM | POA: Diagnosis not present

## 2019-11-20 DIAGNOSIS — I2510 Atherosclerotic heart disease of native coronary artery without angina pectoris: Secondary | ICD-10-CM | POA: Diagnosis not present

## 2019-11-20 DIAGNOSIS — R062 Wheezing: Secondary | ICD-10-CM | POA: Diagnosis not present

## 2019-11-20 DIAGNOSIS — R0600 Dyspnea, unspecified: Secondary | ICD-10-CM | POA: Diagnosis not present

## 2019-11-20 DIAGNOSIS — R053 Chronic cough: Secondary | ICD-10-CM

## 2019-11-20 DIAGNOSIS — R05 Cough: Secondary | ICD-10-CM

## 2019-11-20 NOTE — Progress Notes (Signed)
OV 11/20/2019  Subjective:  Patient ID: Joshua Warner, male , DOB: 02/01/1951 , age 69 y.o. , MRN: [id number] , ADDRESS: [place] [address]   11/20/2019 -   Chief Complaint  Patient presents with  . Consult   Chief complaint of chronic cough  HPI Joshua Warner 69 y.o. -presents for evaluation of chronic cough.  He 69 years old.  He has been relocated from [place] to [place] approximately 12 years ago.  While in [place] he is to have recurrent "head colds".  The sounds like recurrent sinusitis.  This persisted even in [place] but then after he started getting the flu shots this went away.  However he started getting winter bronchitis in [place].  Then approximately on September 08, 2018 3 days after getting his pneumonia vaccine second shot he developed sudden onset of chronic cough that is really not gone away.  It is persistent and stable since then.  In December 2020 he had COVID-19.  He does not recollect getting monoclonal antibody infusion.  He says the Covid did not affect his cough.  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  He was on ventilator and now he has survived and back to work.  Patient is really worried about his pulmonary issues.  He says that his cough associated with a tickle in the throat and sometimes when he talks a lot hoarse voice.  No diagnosis of asthma but there is associated wheezing sometimes at night.  Certainly talking a lot makes the cough worse.  Also associated with hoarseness.  He denies a postnasal drip but he does feel a tickle in the throat.  The cough does not wake him up in the middle of the night.  He was on a blood pressure medicine presumably lisinopril that was stopped at the onset of cough but this did not affect the cough.  He has acid reflux and Schatzki's ring that was dilated.  He believes his spring  allergies in [place].  He does have insidious onset of shortness of breath that is stable present on exertion relieved by rest.  Associated with visceral obesity and weight gain particularly in the pandemic.  CT scan of the chest shows elevated right hemidiaphragm.  CT scan also shows coronary artery calcification but I did not elicit if he has had a stress test.  He does not have chest pain.    CT CHEST 10/29/19 visualized and agree with the findings below. Lungs/Pleura: Elevation of the right hemidiaphragm with scarring in the right lower lobe and middle lobe, stable. Calcified granuloma in the left lower lobe. No suspicious pulmonary nodules or effusions. Previously seen 5 mm right lower lobe nodule no longer visualized.  Upper Abdomen: Imaging into the upper abdomen shows no acute findings. Calcifications in the liver and spleen compatible with old granulomatous disease.  Musculoskeletal: Chest wall soft tissues are unremarkable. No acute bony abnormality.  IMPRESSION: Previously seen 5 mm right lower lobe pulmonary nodule no longer visualized. No suspicious new or enlarging pulmonary nodules.  Old granulomatous disease.  Stable elevation of the right hemidiaphragm.  Heavily calcified coronary arteries.  4.6 cm ascending thoracic aortic aneurysm, stable since prior study.  Aortic Atherosclerosis (ICD10-I70.0).   Electronically Signed   By: Rolm Baptise M.D.   On: 10/29/2019 15:03   ROS - per HPI  has a past medical history of Arthritis, CHF (congestive heart failure) ([place]), Coronary artery disease, GERD (gastroesophageal reflux disease), Hyperlipidemia, Hypertension (since 2007), Kidney stones (2009), Myocardial infarction ([place]) (12/2015), and Oxygen deficiency.   reports that he has never smoked. He has never used smokeless tobacco.  Past Surgical History:  Procedure Laterality Date  . CORONARY ANGIOPLASTY WITH STENT PLACEMENT  12/2015  . KNEE  ARTHROSCOPY  12/31/2010   Right knee   . TONSILLECTOMY  1957    Allergies  Allergen Reactions  . Celebrex [Celecoxib]     REACTION: rash REACTION: rash  . Sulfa Antibiotics     Other reaction(s): Unknown  . Sulfonamide Derivatives     REACTION: hives  . Lisinopril Other (See Comments)    Cough    Immunization History  Administered Date(s) Administered  . Fluad Quad(high Dose 65+) 03/13/2019  . Influenza Split 03/28/2012, 03/28/2012  . Influenza Whole 04/28/2009, 03/28/2010  . Influenza, High Dose Seasonal PF 03/04/2017  . Influenza, Seasonal, Injecte, Preservative Fre 03/22/2014, 03/28/2015, 02/20/2016  . Influenza,inj,Quad PF,6+ Mos 03/22/2014, 03/28/2015, 02/20/2016, 03/13/2018  . Influenza-Unspecified 04/19/2013  . Moderna SARS-COVID-2 Vaccination 07/30/2019, 08/27/2019  . PFIZER SARS-COV-2 Vaccination 07/30/2019  . Pneumococcal Conjugate-13 03/04/2017  . Pneumococcal Polysaccharide-23 09/11/2018  . Td 04/28/2008  . Tdap 09/13/2018  . Zoster 12/14/2013    Family History  Problem Relation Age of Onset  . Heart disease Father   . Hypertension Mother   . Prostate cancer Maternal Uncle        Deceased  . Depression Maternal Uncle   . Cancer Paternal Grandfather   . Cancer Paternal Grandmother   . Colon cancer Neg Hx   . Stomach cancer Neg Hx   . Colon polyps Neg Hx   . Esophageal cancer Neg Hx      Current Outpatient Medications:  .  ASPIRIN ADULT PO, Take 81 mg by mouth. , Disp: , Rfl:  .  atorvastatin (LIPITOR) 80 MG tablet, Take 80 mg by mouth at bedtime., Disp: , Rfl:  .  b complex vitamins tablet, Take 1 tablet by mouth daily., Disp: , Rfl:  .  Cholecalciferol 125 MCG (5000 UT) capsule, Take 5,000 Units by mouth daily., Disp: , Rfl:  .  fluticasone (FLONASE) 50 MCG/ACT nasal spray, Place 1-2 sprays into both nostrils daily., Disp: 16 g, Rfl: 12 .  Glucosamine-Chondroit-Vit C-Mn (GLUCOSAMINE CHONDR 1500 COMPLX) CAPS, Take by mouth daily.  , Disp: , Rfl:  .   loratadine (CLARITIN) 10 MG tablet, Take 10 mg by mouth daily., Disp: , Rfl:  .  losartan (COZAAR) 50 MG tablet, TAKE 1 TABLET BY MOUTH EVERY DAY, Disp: 90 tablet, Rfl: 0 .  metoprolol tartrate (LOPRESSOR) 25 MG tablet, Take 12.5 mg by mouth 2 (two) times daily., Disp: , Rfl:  .  Multiple Vitamin (MULTIVITAMIN) tablet, Take 1 tablet by mouth daily.  , Disp: , Rfl:  .  nitroGLYCERIN (NITROSTAT) 0.4 MG SL tablet, PLACE 1 TABLET (0.4 MG TOTAL) UNDER THE TONGUE EVERY 5 (FIVE) MINUTES AS NEEDED., Disp: 25 tablet, Rfl: 1 .  pantoprazole (PROTONIX) 40 MG tablet, Take 1 tablet (40 mg total) by mouth daily., Disp: 30 tablet, Rfl: 11 .  vitamin C (ASCORBIC ACID) 500 MG tablet, Take 1,000 mg by mouth daily., Disp: , Rfl:       Objective:   Vitals:   11/20/19 1005  BP: 124/82  Pulse: 71  Temp: 98.2 F (36.8 C)  TempSrc: Oral  SpO2: 94%  Weight: 224 lb  9.6 oz (101.9 kg)    Estimated body mass index is 32.23 kg/m as calculated from the following:   Height as of 11/06/19: 5\' 10"  (1.778 m).   Weight as of this encounter: 224 lb 9.6 oz (101.9 kg).  @WEIGHTCHANGE @  [employer]   11/20/19 1005  Weight: 224 lb 9.6 oz (101.9 kg)     Physical Exam  General Appearance:    Alert, cooperative, no distress, appears stated age - yes , Deconditioned looking - no , OBESE  - yes, Sitting on Wheelchair -  no  Head:    Normocephalic, without obvious abnormality, atraumatic  Eyes:    PERRL, conjunctiva/corneas clear,  Ears:    Normal TM's and external ear canals, both ears  Nose:   Nares normal, septum midline, mucosa normal, no drainage    or sinus tenderness. OXYGEN ON  - no . Patient is @ ra   Throat:   Lips, mucosa, and tongue normal; teeth and gums normal. Cyanosis on lips - no  Neck:   Supple, symmetrical, trachea midline, no adenopathy;    thyroid:  no enlargement/tenderness/nodules; no carotid   bruit or JVD  Back:     Symmetric, no curvature, ROM normal, no CVA tenderness  Lungs:      Distress - no , Wheeze no, Barrell Chest - no, Purse lip breathing - no, Crackles - no   Chest Wall:    No tenderness or deformity.    Heart:    Regular rate and rhythm, S1 and S2 normal, no rub   or gallop, Murmur - no  Breast Exam:    NOT DONE  Abdomen:     Soft, non-tender, bowel sounds active all four quadrants,    no masses, no organomegaly. Visceral obesity - yes  Genitalia:   NOT DONE  Rectal:   NOT DONE  Extremities:   Extremities - normal, Has Cane - no, Clubbing - no, Edema - no  Pulses:   2+ and symmetric all extremities  Skin:   Stigmata of Connective Tissue Disease - no  Lymph nodes:   Cervical, supraclavicular, and axillary nodes normal  Psychiatric:  Neurologic:   Pleasant - yes, Anxious - no, Flat affect - no  CAm-ICU - neg, Alert and Oriented x 3 - yes, Moves all 4s - yes, Speech - normal, Cognition - intact           Assessment:       ICD-10-CM   1. Chronic cough  R05 IgE    CBC with Differential/Platelet    Resp Allergy Profile Regn2DC [facility]    Pulmonary function test  2. Wheezing  R06.2 IgE    CBC with Differential/Platelet    Resp Allergy Profile Regn2DC DE [facility]  3. Recurrent sinus infections  J32.9 CT MAXILLOFACIAL WO CONTRAST    Ambulatory referral to ENT  4. Irritable larynx  J38.7 CT MAXILLOFACIAL WO CONTRAST    Ambulatory referral to ENT  5. Elevated hemidiaphragm  J98.6 DG Sniff Test  6. Dyspnea on exertion  R06.00 Pulmonary function test  7. Coronary artery calcification seen on CAT scan  I25.10        Plan:     Patient Instructions     ICD-10-CM   1. Chronic cough  R05   2. Wheezing  R06.2   3. Recurrent sinus infections  J32.9   4. Irritable larynx  J38.7   5. Elevated hemidiaphragm  J98.6   6. Dyspnea on exertion  R06.00    You have a condition called chronic refractory cough otherwise call irritable larynx syndrome or laryngopharyngeal reflux or cough neuropathy  Underlying undiagnosed sinus problems acid reflux and  possible asthma can be fueling the flame if you will  Your shortness of breath is probably unrelated to the cough.  I do not see any evidence of pulmonary fibrosis disease.  Shortness of breath likely due to elevated hemidiaphragm on the right side and also visceral obesity  Plan -Need to figure out if he have asthma and spring allergies  -Do blood IgE, CBC with differential and blood allergy panel  -Do full pulmonary function test  - Do FeNO test  -Need to ensure that your sinuses and vocal cords are fine  -Refer to ENT  -Do CT sinus maxillofacial without contrast  -For diaphragm issue  -Do sniff test  Follow-up -15-minute telephone visit a face-to-face visit a video visit with nurse practitioner Dr. Chase Caller in the next 4 weeks but after completing all of the above -to discuss results in the next step   -Please note treatment for cough neuropathy might include speech therapy, voice rest and a medicine called gabapentin -this can be addressed at follow-up  -Address coronary artery calcification at follow-up      SIGNATURE    Dr. Brand Males, M.D., F.C.C.P,  Pulmonary and Critical Care Medicine Staff Physician, [place] Director - Interstitial Lung Disease  Program  P[facility] [place], [place], [address]  Pager: [phone], If no answer or between  15:00h - 7:00h: call [phone] Telephone: [phone]  10:37 AM 11/20/2019

## 2019-11-20 NOTE — Patient Instructions (Addendum)
ICD-10-CM   1. Chronic cough  R05   2. Wheezing  R06.2   3. Recurrent sinus infections  J32.9   4. Irritable larynx  J38.7   5. Elevated hemidiaphragm  J98.6   6. Dyspnea on exertion  R06.00    You have a condition called chronic refractory cough otherwise call irritable larynx syndrome or laryngopharyngeal reflux or cough neuropathy  Underlying undiagnosed sinus problems acid reflux and possible asthma can be fueling the flame if you will  Your shortness of breath is probably unrelated to the cough.  I do not see any evidence of pulmonary fibrosis disease.  Shortness of breath likely due to elevated hemidiaphragm on the right side and also visceral obesity  Plan -Need to figure out if he have asthma and spring allergies  -Do blood IgE, CBC with differential and blood allergy panel  -Do full pulmonary function test  - Do FeNO test  -Need to ensure that your sinuses and vocal cords are fine  -Refer to ENT  -Do CT sinus maxillofacial without contrast  -For diaphragm issue  -Do sniff test  Follow-up -15-minute telephone visit a face-to-face visit a video visit with nurse practitioner Dr. Chase Caller in the next 4 weeks but after completing all of the above -to discuss results in the next step   -Please note treatment for cough neuropathy might include speech therapy, voice rest and a medicine called gabapentin -this can be addressed at follow-up  -Address coronary artery calcification at follow-up

## 2019-11-25 ENCOUNTER — Other Ambulatory Visit: Payer: Self-pay | Admitting: Family Medicine

## 2019-11-27 ENCOUNTER — Other Ambulatory Visit (INDEPENDENT_AMBULATORY_CARE_PROVIDER_SITE_OTHER): Payer: Medicare Other

## 2019-11-27 DIAGNOSIS — R062 Wheezing: Secondary | ICD-10-CM

## 2019-11-27 DIAGNOSIS — R05 Cough: Secondary | ICD-10-CM

## 2019-11-27 DIAGNOSIS — R053 Chronic cough: Secondary | ICD-10-CM

## 2019-11-27 LAB — CBC WITH DIFFERENTIAL/PLATELET
Basophils Absolute: 0 10*3/uL (ref 0.0–0.1)
Basophils Relative: 0.7 % (ref 0.0–3.0)
Eosinophils Absolute: 0.2 10*3/uL (ref 0.0–0.7)
Eosinophils Relative: 3.4 % (ref 0.0–5.0)
HCT: 51.4 % (ref 39.0–52.0)
Hemoglobin: 17.6 g/dL — ABNORMAL HIGH (ref 13.0–17.0)
Lymphocytes Relative: 31.7 % (ref 12.0–46.0)
Lymphs Abs: 2.1 10*3/uL (ref 0.7–4.0)
MCHC: 34.3 g/dL (ref 30.0–36.0)
MCV: 91.9 fl (ref 78.0–100.0)
Monocytes Absolute: 0.5 10*3/uL (ref 0.1–1.0)
Monocytes Relative: 7.9 % (ref 3.0–12.0)
Neutro Abs: 3.7 10*3/uL (ref 1.4–7.7)
Neutrophils Relative %: 56.3 % (ref 43.0–77.0)
Platelets: 200 10*3/uL (ref 150.0–400.0)
RBC: 5.59 Mil/uL (ref 4.22–5.81)
RDW: 13.2 % (ref 11.5–15.5)
WBC: 6.6 10*3/uL (ref 4.0–10.5)

## 2019-11-28 LAB — RESPIRATORY ALLERGY PROFILE REGION II ~~LOC~~

## 2019-11-28 LAB — INTERPRETATION:

## 2019-11-29 ENCOUNTER — Other Ambulatory Visit (HOSPITAL_COMMUNITY): Payer: Medicare Other

## 2019-11-29 ENCOUNTER — Ambulatory Visit (HOSPITAL_COMMUNITY): Payer: Medicare Other

## 2019-12-03 DIAGNOSIS — M9901 Segmental and somatic dysfunction of cervical region: Secondary | ICD-10-CM | POA: Diagnosis not present

## 2019-12-03 DIAGNOSIS — M542 Cervicalgia: Secondary | ICD-10-CM | POA: Diagnosis not present

## 2019-12-03 DIAGNOSIS — M9903 Segmental and somatic dysfunction of lumbar region: Secondary | ICD-10-CM | POA: Diagnosis not present

## 2019-12-03 DIAGNOSIS — M545 Low back pain: Secondary | ICD-10-CM | POA: Diagnosis not present

## 2019-12-03 DIAGNOSIS — M62830 Muscle spasm of back: Secondary | ICD-10-CM | POA: Diagnosis not present

## 2019-12-03 DIAGNOSIS — M25511 Pain in right shoulder: Secondary | ICD-10-CM | POA: Diagnosis not present

## 2019-12-03 DIAGNOSIS — M25552 Pain in left hip: Secondary | ICD-10-CM | POA: Diagnosis not present

## 2019-12-04 ENCOUNTER — Ambulatory Visit (HOSPITAL_COMMUNITY)
Admission: RE | Admit: 2019-12-04 | Discharge: 2019-12-04 | Disposition: A | Discharge status: Home / Self Care | Payer: Medicare Other | Source: Ambulatory Visit | Attending: Internal Medicine | Admitting: Internal Medicine

## 2019-12-04 ENCOUNTER — Other Ambulatory Visit: Payer: Self-pay

## 2019-12-04 ENCOUNTER — Encounter (HOSPITAL_COMMUNITY): Payer: Self-pay

## 2019-12-04 DIAGNOSIS — J329 Chronic sinusitis, unspecified: Secondary | ICD-10-CM | POA: Diagnosis not present

## 2019-12-04 DIAGNOSIS — J387 Other diseases of larynx: Secondary | ICD-10-CM | POA: Insufficient documentation

## 2019-12-04 DIAGNOSIS — J986 Disorders of diaphragm: Secondary | ICD-10-CM

## 2019-12-05 ENCOUNTER — Telehealth: Payer: Self-pay | Admitting: Internal Medicine

## 2019-12-05 NOTE — Telephone Encounter (Signed)
Spoke with pt, he states half of his diaphragm doesn't move when he breathes according to Dr. Chase Caller. 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 He wants to know if he can get a colonoscopy. He is somewhat short winded but he feels fine. Would like to get MR opinion. Please advise.

## 2019-12-06 NOTE — Telephone Encounter (Signed)
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  Therefore okay to have endoscopy as long is not wheezing or having active symptoms on the day of the endoscopy or in the few days leading up to endoscopy.     LABS  Results for Joshua Warner, Joshua Warner (MRN [id number]) as of 12/06/2019 09:14  Ref. Range 11/27/2019 09:12  Eosinophils Absolute Latest Ref Range: 0 - 0 K/uL 0.2   Results for Joshua Warner, Joshua Warner (MRN [id number]) as of 12/06/2019 09:14  Ref. Range 11/27/2019 09:12  Sheep Sorrel IgE Latest Units: kU/L <0.10  Pecan/Hickory Tree IgE Latest Units: kU/L <0.10  IgE (Immunoglobulin E), Serum Latest Ref Range: <OR=114 kU/L 30  Allergen, D pternoyssinus,d7 Latest Units: kU/L <0.10  Cat Dander Latest Units: kU/L <0.10  Dog Dander Latest Units: kU/L <0.10  [place] Grass Latest Units: kU/L <0.10  Johnson Grass Latest Units: kU/L <0.10  Timothy Grass Latest Units: kU/L <0.10  Cockroach Latest Units: kU/L <0.10  Aspergillus fumigatus, m3 Latest Units: kU/L <0.10  Allergen, Comm Silver [name], t9 Latest Units: kU/L <0.10  Allergen, Cottonwood, t14 Latest Units: kU/L <0.10  Elm IgE Latest Units: kU/L <0.10  Allergen, Mulberry, t76 Latest Units: kU/L <0.10  Allergen, Oak,t7 Latest Units: kU/L <0.10  COMMON RAGWEED (SHORT) (W1) IGE Latest Units: kU/L <0.10  Allergen, Mouse  Urine Protein, e78 Latest Units: kU/L <0.10  D. farinae Latest Units: kU/L <0.10  Allergen, Cedar tree, t12 Latest Units: kU/L <0.10  Box Elder IgE Latest Units: kU/L <0.10  Rough Pigweed  IgE Latest Units: kU/L <0.10       IMAGING x48h  - image(s) personally visualized  -   highlighted in bold DG Sniff Test  Result Date: 12/04/2019 CLINICAL DATA:  Shortness of breath. EXAM: CHEST FLUOROSCOPY TECHNIQUE: Real-time fluoroscopic evaluation of the chest was performed. FLUOROSCOPY TIME:  Fluoroscopy Time:  56 seconds Radiation Exposure Index (if provided by the fluoroscopic device): Number of Acquired Spot Images: 0 COMPARISON:  Chest CT 10/29/2019 FINDINGS: Fluoroscopic evaluation while the patient is breathing shows normal motion of the left hemidiaphragm. The right hemidiaphragm is elevated and does not move with inspiration or sniffing. 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 Electronically Signed   By: Rolm Baptise M.D.   On: 12/04/2019 10:30   CT MAXILLOFACIAL WO CONTRAST  Result Date: 12/05/2019 CLINICAL DATA:  Cough for 1.5 years, some sinus drainage, evaluate for sinusitis EXAM: CT MAXILLOFACIAL WITHOUT CONTRAST TECHNIQUE: Multidetector CT imaging of the maxillofacial structures was performed. Multiplanar CT image reconstructions were also generated. COMPARISON:  None FINDINGS: Osseous: Bony orbits are intact. Likely chronic bilateral nasal bone deformities. No other acute or chronic mid face fractures are seen. The pterygoid plates are intact. The mandible is intact. Temporomandibular joints are normally aligned. No temporal bone fractures are identified. Multitude of absent dentition.  Impacted right third maxillary molar is noted. Retained root of the left first molar with small periapical lucency versus dentigerous cyst, contiguous with the floor of the maxillary sinus. Metallic dental bridge noted in the roof of the mouth. Several dental amalgams are present. No  worrisome periapical lucencies. Orbits: The globes appear normal and symmetric. Symmetric appearance of the extraocular musculature and optic nerve sheath complexes. Normal caliber of the superior ophthalmic veins. Paranasal sinuses: Frontal: Normally aerated. Patent frontal sinus drainage pathways. Ethmoid: Minimal thickening in a few posterior left ethmoid air cells. Otherwise normally aerated. Maxillary: Small retention cyst noted in the the tendon portions of the maxillary sinuses. Sphenoid: Normally aerated. Patent sphenoethmoidal recesses. Right ostiomeatal unit: Patent. Left ostiomeatal unit: Patent. Nasal passages: Patent. Mild leftward nasal deviation with a left-sided nasal septal spur. Anatomy: No pneumatization superior to anterior ethmoid notches. Symmetric and intact olfactory grooves and fovea ethmoidalis, Keros II (4-28mm). Presellar sphenoid pneumatization pattern. No dehiscence of carotid or optic canals. No onodi cell. Visible mastoid air cells are normally aerated. Middle ear cavities are clear. Ossicular chains are normally configured. Soft tissues: No worrisome soft tissue swelling, gas or foreign body. Limited intracranial and cervical: intracranial atherosclerosis with calcification of the carotid siphons and intradural vertebral arteries. No other acute or significant intracranial abnormalities. Evaluation of the cervical spine reveals mild straightening with multilevel cervical spondylitic changes without acute osseous injury or abnormality. IMPRESSION: 1. Minimal thickening in a few posterior left ethmoid air cells and small retention cysts in the maxillary sinuses. Paranasal sinuses are otherwise predominantly clear with patent drainage pathways. 2. Retained root of the left first molar with small periapical lucency versus dentigerous cyst, contiguous with the floor of the maxillary sinus. Consider dental evaluation. 3. Mild leftward nasal deviation with a left-sided nasal septal spur. 4.  Likely chronic bilateral nasal bone deformities. No other acute or chronic mid face fractures are seen. 5. Intracranial atherosclerosis. 6. Multilevel cervical spondylitic changes. Electronically Signed   By: Lovena Le M.D.   On: 12/05/2019 03:36

## 2019-12-06 NOTE — Telephone Encounter (Signed)
Spoke with patient. He verbalized understanding of results. I offered to type the information into a letter for him to serve as proof MR has cleared him. He stated that he did not believe he needed one.   Nothing further needed at time of call.

## 2019-12-10 ENCOUNTER — Ambulatory Visit (AMBULATORY_SURGERY_CENTER): Payer: Medicare Other | Admitting: Gastroenterology

## 2019-12-10 ENCOUNTER — Other Ambulatory Visit: Payer: Self-pay

## 2019-12-10 ENCOUNTER — Encounter: Payer: Self-pay | Admitting: Gastroenterology

## 2019-12-10 VITALS — BP 120/77 | HR 68 | Temp 97.5°F | Resp 13 | Ht 70.0 in | Wt 224.0 lb

## 2019-12-10 DIAGNOSIS — K635 Polyp of colon: Secondary | ICD-10-CM

## 2019-12-10 DIAGNOSIS — Z86010 Personal history of colonic polyps: Secondary | ICD-10-CM | POA: Diagnosis not present

## 2019-12-10 DIAGNOSIS — D124 Benign neoplasm of descending colon: Secondary | ICD-10-CM

## 2019-12-10 MED ORDER — SODIUM CHLORIDE 0.9 % IV SOLN: 500.0000 mL | Freq: Once | INTRAVENOUS | Status: DC

## 2019-12-10 NOTE — Progress Notes (Signed)
pt tolerated well. VSS. awake and to recovery. Report given to RN.  

## 2019-12-10 NOTE — Op Note (Signed)
[facility] Patient Name: Joshua Warner Procedure Date: 12/10/2019 1:54 PM MRN: [id number] Endoscopist: Jackquline Denmark , MD Age: 69 Referring MD:  Date of Birth: 21-Nov-1950 Gender: Male Account #: [id number] Procedure:                Colonoscopy Indications:              High risk colon cancer surveillance: Personal                            history of colonic polyps Medicines:                Monitored Anesthesia Care Procedure:                Pre-Anesthesia Assessment:                           - Prior to the procedure, a History and Physical                            was performed, and patient medications and                            allergies were reviewed. The patient's tolerance of                            previous anesthesia was also reviewed. The risks                            and benefits of the procedure and the sedation                            options and risks were discussed with the patient.                            All questions were answered, and informed consent                            was obtained. Prior Anticoagulants: The patient has                            taken no previous anticoagulant or antiplatelet                            agents. ASA Grade Assessment: III - A patient with                            severe systemic disease. After reviewing the risks                            and benefits, the patient was deemed in                            satisfactory condition to undergo the procedure.  After obtaining informed consent, the colonoscope                            was passed under direct vision. Throughout the                            procedure, the patient's blood pressure, pulse, and                            oxygen saturations were monitored continuously. The                            Colonoscope was introduced through the anus and                            advanced to the 2 cm into the ileum.  The                            colonoscopy was performed without difficulty. The                            patient tolerated the procedure well. The quality                            of the bowel preparation was good. The terminal                            ileum, ileocecal valve, appendiceal orifice, and                            rectum were photographed. Scope In: 2:04:43 PM Scope Out: 2:20:47 PM Scope Withdrawal Time: 0 hours 12 minutes 28 seconds  Total Procedure Duration: 0 hours 16 minutes 4 seconds  Findings:                 A 6 mm polyp was found in the proximal descending                            colon. The polyp was sessile. The polyp was removed                            with a cold snare. Resection and retrieval were                            complete.                           Multiple small and large-mouthed diverticula were                            found in the sigmoid colon, few in descending colon                            and ascending colon.  Non-bleeding internal hemorrhoids were found during                            retroflexion. The hemorrhoids were small.                           The terminal ileum appeared normal.                           The exam was otherwise without abnormality on                            direct and retroflexion views. Complications:            No immediate complications. Estimated Blood Loss:     Estimated blood loss: none. Impression:               - One 6 mm polyp in the proximal descending colon,                            removed with a cold snare. Resected and retrieved.                           - Moderate predominantly sigmoid diverticulosis.                           - Otherwise normal colonoscopy to TI. Recommendation:           - Patient has a contact number available for                            emergencies. The signs and symptoms of potential                            delayed  complications were discussed with the                            patient. Return to normal activities tomorrow.                            Written discharge instructions were provided to the                            patient.                           - High fiber diet.                           - Await pathology results.                           - Repeat colonoscopy for surveillance based on                            pathology results.                           -  The findings and recommendations were discussed                            with the patient's daughter Ria Clock, MD 12/10/2019 2:26:32 PM This report has been signed electronically.

## 2019-12-10 NOTE — Progress Notes (Signed)
Pt's states no medical or surgical changes since previsit or office visit.  Temp- June Vitals- Courtney 

## 2019-12-10 NOTE — Patient Instructions (Signed)
HANDOUTS PROVIDED ON: HIGH FIBER DIET, POLYPS, DIVERTICULOSIS, & HEMORRHOIDS  The polyps removed today have been sent for pathology.  The results can take 1-3 weeks to receive.  When your next colonoscopy should occur will be based on the pathology results.    You may resume your previous diet and medication schedule.  Thank you for allowing us to care for you today!!!   YOU HAD AN ENDOSCOPIC PROCEDURE TODAY AT THE [facility]:   Refer to the procedure report that was given to you for any specific questions about what was found during the examination.  If the procedure report does not answer your questions, please call your gastroenterologist to clarify.  If you requested that your care partner not be given the details of your procedure findings, then the procedure report has been included in a sealed envelope for you to review at your convenience later.  YOU SHOULD EXPECT: Some feelings of bloating in the abdomen. Passage of more gas than usual.  Walking can help get rid of the air that was put into your GI tract during the procedure and reduce the bloating. If you had a lower endoscopy (such as a colonoscopy or flexible sigmoidoscopy) you may notice spotting of blood in your stool or on the toilet paper. If you underwent a bowel prep for your procedure, you may not have a normal bowel movement for a few days.  Please Note:  You might notice some irritation and congestion in your nose or some drainage.  This is from the oxygen used during your procedure.  There is no need for concern and it should clear up in a day or so.  SYMPTOMS TO REPORT IMMEDIATELY:   Following lower endoscopy (colonoscopy or flexible sigmoidoscopy):  Excessive amounts of blood in the stool  Significant tenderness or worsening of abdominal pains  Swelling of the abdomen that is new, acute  Fever of 100F or higher  For urgent or emergent issues, a gastroenterologist can be reached at any hour by calling  [phone]. Do not use MyChart messaging for urgent concerns.    DIET:  We do recommend a small meal at first, but then you may proceed to your regular diet.  Drink plenty of fluids but you should avoid alcoholic beverages for 24 hours.  ACTIVITY:  You should plan to take it easy for the rest of today and you should NOT DRIVE or use heavy machinery until tomorrow (because of the sedation medicines used during the test).    FOLLOW UP: Our staff will call the number listed on your records 48-72 hours following your procedure to check on you and address any questions or concerns that you may have regarding the information given to you following your procedure. If we do not reach you, we will leave a message.  We will attempt to reach you two times.  During this call, we will ask if you have developed any symptoms of COVID 19. If you develop any symptoms (ie: fever, flu-like symptoms, shortness of breath, cough etc.) before then, please call [phone].  If you test positive for Covid 19 in the 2 weeks post procedure, please call and report this information to us.    If any biopsies were taken you will be contacted by phone or by letter within the next 1-3 weeks.  Please call us at [phone] if you have not heard about the biopsies in 3 weeks.    SIGNATURES/CONFIDENTIALITY: You and/or your care partner have signed   paperwork which will be entered into your electronic medical record.  These signatures attest to the fact that that the information above on your After Visit Summary has been reviewed and is understood.  Full responsibility of the confidentiality of this discharge information lies with you and/or your care-partner. 

## 2019-12-12 ENCOUNTER — Telehealth: Payer: Self-pay

## 2019-12-12 NOTE — Telephone Encounter (Signed)
°  Follow up Call-  Call back number 12/10/2019 10/09/2019  Post procedure Call Back phone  # [phone]  Permission to leave phone message Yes Yes  Some recent data might be hidden     Patient questions:  Do you have a fever, pain , or abdominal swelling? No. Pain Score  0 *  Have you tolerated food without any problems? Yes.    Have you been able to return to your normal activities? Yes.    Do you have any questions about your discharge instructions: Diet   No. Medications  No. Follow up visit  No.  Do you have questions or concerns about your Care? No.  Actions: * If pain score is 4 or above: No action needed, pain <4.  Have you developed a fever since your procedure? no 2.   Have you had an respiratory symptoms (SOB or cough) since your procedure? no  3.   Have you tested positive for COVID 19 since your procedure no  4.   Have you had any family members/close contacts diagnosed with the COVID 19 since your procedure?  no   If yes to any of these questions please route to Joylene John, RN and Erenest Rasher, RN

## 2019-12-12 NOTE — Telephone Encounter (Signed)
Left message on follow up call. 

## 2019-12-13 ENCOUNTER — Encounter: Payer: Self-pay | Admitting: Gastroenterology

## 2020-01-05 ENCOUNTER — Inpatient Hospital Stay (HOSPITAL_COMMUNITY): Admission: RE | Admit: 2020-01-05 | Payer: Medicare Other | Source: Ambulatory Visit

## 2020-01-09 ENCOUNTER — Other Ambulatory Visit: Payer: Self-pay

## 2020-01-09 ENCOUNTER — Ambulatory Visit (INDEPENDENT_AMBULATORY_CARE_PROVIDER_SITE_OTHER): Payer: Medicare Other | Admitting: Internal Medicine

## 2020-01-09 DIAGNOSIS — R0600 Dyspnea, unspecified: Secondary | ICD-10-CM

## 2020-01-09 DIAGNOSIS — R053 Chronic cough: Secondary | ICD-10-CM

## 2020-01-09 DIAGNOSIS — R05 Cough: Secondary | ICD-10-CM | POA: Diagnosis not present

## 2020-01-09 DIAGNOSIS — R0609 Other forms of dyspnea: Secondary | ICD-10-CM

## 2020-01-09 NOTE — Progress Notes (Signed)
Full PFT performed today. °

## 2020-01-14 ENCOUNTER — Ambulatory Visit (INDEPENDENT_AMBULATORY_CARE_PROVIDER_SITE_OTHER): Payer: Medicare Other | Admitting: Acute Care

## 2020-01-14 ENCOUNTER — Other Ambulatory Visit: Payer: Self-pay

## 2020-01-14 ENCOUNTER — Encounter: Payer: Self-pay | Admitting: Acute Care

## 2020-01-14 VITALS — BP 120/70 | HR 60 | Temp 97.3°F | Ht 70.0 in | Wt 228.4 lb

## 2020-01-14 DIAGNOSIS — I2510 Atherosclerotic heart disease of native coronary artery without angina pectoris: Secondary | ICD-10-CM | POA: Diagnosis not present

## 2020-01-14 DIAGNOSIS — R059 Cough, unspecified: Secondary | ICD-10-CM

## 2020-01-14 DIAGNOSIS — R05 Cough: Secondary | ICD-10-CM | POA: Diagnosis not present

## 2020-01-14 NOTE — Patient Instructions (Addendum)
It is good to see you today. Your Pulmonary Function Tests are relatively normal. We will order a FENO as Dr. Chase Caller had initially ordered. We will refer you to ENT to evaluate vocal cords for upper airway cough.  Continue your Protonix 40 mg daily. Continue Claritin daily Sips of water instead of throat clearing Sugar Free [employer] or [employer] originals for throat soothing. Delsym Cough syrup 5 cc's every 12 hours Non-sedating antihistamine of your choice daily ( Zyrtec, Allegra, Xyzol, Claritin ( Generic ok). We will give you a copy of the GERD diet. Continue to increase your exercise and work on weight loss with portion control.   Follow up with Judson Roch NP Dr. Chase Caller after FENO and ENT eval to evaluate. Please contact office for sooner follow up if symptoms do not improve or worsen or seek emergency care  Call us if you need [place] sooner. Dyspnea on exertopn

## 2020-01-14 NOTE — Progress Notes (Signed)
History of Present Illness Joshua Warner is a 69 y.o. male with chronic cough and exertional dyspnea. He is followed by Dr. Chase Caller.  Synopsis 69 year old male with history of cough starting 08/2018 after pneumovax vaccine. Had COVID 05/2019. Confirmed right   01/14/2020  Pt. Presents for follow up. He is being evaluated for asthma, allergies. He states his hoarseness and cough come and go. He states he sometimes gas this in the morning, and sometimes in the evening.He is compliant with his protonix daily. He does have reflux. He has noticed that when he has weight gain, this makes his GERD worse. He developed a cough 3 days after he had a pneumovax March of 2020. He feels this has been his trigger. He is working on weight loss. We discussed going to the gym more frequently and seeing a nutritionist for weight loss and diet management. He is using cough drops with menthol, which may be making his cough worse. He does endorse wheezing that goes away on its own. He denies fever, chest pain, orthopnea or hemoptysis.   Test Results: Eosinophils> WNL Respiratory Allergies >> WNL FENO >> pending ENT referral >> Pending  PFT's 01/09/2020        CBC Latest Ref Rng & Units 11/27/2019 09/11/2019 08/28/2012  WBC 4.0 - 10.5 K/uL 6.6 5.5 8.1  Hemoglobin 13.0 - 17.0 g/dL 17.6(H) 17.2(H) 17.1(H)  Hematocrit 39 - 52 % 51.4 50.0 48.8  Platelets 150 - 400 K/uL 200.0 204 245    BMP Latest Ref Rng & Units 09/11/2019 09/05/2018 03/16/2018  Glucose 65 - 99 mg/dL 98 90 98  BUN 7 - 25 mg/dL 18 16 22   Creatinine 0.70 - 1.25 mg/dL 1.03 1.03 1.16  BUN/Creat Ratio 6 - 22 (calc) NOT APPLICABLE NOT APPLICABLE NOT APPLICABLE  Sodium 211 - 146 mmol/L 139 137 138  Potassium 3.5 - 5.3 mmol/L 4.3 4.3 4.3  Chloride 98 - 110 mmol/L 106 104 103  CO2 20 - 32 mmol/L 24 26 24   Calcium 8.6 - 10.3 mg/dL 8.8 9.4 9.1    BNP No results found for: BNP  ProBNP No results found for: PROBNP  PFT    Component Value  Date/Time   FEV1PRE 1.58 01/09/2020 1004   FEV1POST 1.55 01/09/2020 1004   FVCPRE 1.97 01/09/2020 1004   FVCPOST 1.83 01/09/2020 1004   TLC 4.28 01/09/2020 1004   DLCOUNC 20.51 01/09/2020 1004   PREFEV1FVCRT 80 01/09/2020 1004   PSTFEV1FVCRT 85 01/09/2020 1004    No results found.   Past medical hx Past Medical History:  Diagnosis Date  . Arthritis   . CHF (congestive heart failure) (HCC)    stents  . Coronary artery disease    stents  . GERD (gastroesophageal reflux disease)   . Hyperlipidemia   . Hypertension since 2007  . Kidney stones 2009  . Myocardial infarction ([place]) 12/2015  . Oxygen deficiency    only during heartattack     Social History   Tobacco Use  . Smoking status: Never Smoker  . Smokeless tobacco: Never Used  Vaping Use  . Vaping Use: Never used  Substance Use Topics  . Alcohol use: Yes    Alcohol/week: 1.0 standard drink    Types: 1 Glasses of wine per week    Comment: occasionally wine  . Drug use: Never    Joshua Warner reports that he has never smoked. He has never used smokeless tobacco. He reports current alcohol use of about 1.0 standard drink of alcohol  per week. He reports that he does not use drugs.  Tobacco Cessation: Never smoker   Past surgical hx, Family hx, Social hx all reviewed.  Current Outpatient Medications on File Prior to Visit  Medication Sig  . ASPIRIN ADULT PO Take 81 mg by mouth.   Marland Kitchen atorvastatin (LIPITOR) 80 MG tablet Take 80 mg by mouth at bedtime.  Marland Kitchen b complex vitamins tablet Take 1 tablet by mouth daily.  . Cholecalciferol 125 MCG (5000 UT) capsule Take 5,000 Units by mouth daily.  . Glucosamine-Chondroit-Vit C-Mn (GLUCOSAMINE CHONDR 1500 COMPLX) CAPS Take by mouth daily.    Marland Kitchen loratadine (CLARITIN) 10 MG tablet Take 10 mg by mouth daily.  Marland Kitchen losartan (COZAAR) 50 MG tablet TAKE 1 TABLET BY MOUTH EVERY DAY  . metoprolol tartrate (LOPRESSOR) 25 MG tablet Take 12.5 mg by mouth 2 (two) times daily.  . Multiple  Vitamin (MULTIVITAMIN) tablet Take 1 tablet by mouth daily.    . nitroGLYCERIN (NITROSTAT) 0.4 MG SL tablet PLACE 1 TABLET (0.4 MG TOTAL) UNDER THE TONGUE EVERY 5 (FIVE) MINUTES AS NEEDED.  Marland Kitchen pantoprazole (PROTONIX) 40 MG tablet Take 1 tablet (40 mg total) by mouth daily.  . vitamin C (ASCORBIC ACID) 500 MG tablet Take 1,000 mg by mouth daily.   No current facility-administered medications on file prior to visit.     Allergies  Allergen Reactions  . Celebrex [Celecoxib]     REACTION: rash REACTION: rash  . Sulfa Antibiotics     Other reaction(s): Unknown  . Sulfonamide Derivatives     REACTION: hives  . Lisinopril Other (See Comments)    Cough    Review Of Systems:  Constitutional:   No  weight loss, night sweats,  Fevers, chills, fatigue, or  lassitude.  HEENT:   No headaches,  Difficulty swallowing,  Tooth/dental problems, or  Sore throat,                No sneezing, itching, ear ache, nasal congestion, post nasal drip,   CV:  No chest pain,  Orthopnea, PND, swelling in lower extremities, anasarca, dizziness, palpitations, syncope.   GI  No heartburn, indigestion, abdominal pain, nausea, vomiting, diarrhea, change in bowel habits, loss of appetite, bloody stools.   Resp: + shortness of breath with exertion less at rest.  No excess mucus, no productive cough,  No non-productive cough,  No coughing up of blood.  No change in color of mucus.  No wheezing.  No chest wall deformity  Skin: no rash or lesions.  GU: no dysuria, change in color of urine, no urgency or frequency.  No flank pain, no hematuria   MS:  No joint pain or swelling.  No decreased range of motion.  No back pain.  Psych:  No change in mood or affect. No depression or anxiety.  No memory loss.   Vital Signs BP 120/70 (BP Location: Right Arm, Cuff Size: Normal)   Pulse 60   Temp (!) 97.3 F (36.3 C) (Oral)   Ht 5\' 10"  (1.778 m)   Wt 228 lb 6.4 oz (103.6 kg)   SpO2 96%   BMI 32.77 kg/m    Physical  Exam:  General- No distress,  A&Ox3, pleasant ENT: No sinus tenderness, TM clear, pale nasal mucosa, no oral exudate,no post nasal drip, no LAN Cardiac: S1, S2, regular rate and rhythm, no murmur Chest: No wheeze/ rales/ dullness; no accessory muscle use, no nasal flaring, no sternal retractions, diminished per bases Abd.: Soft Non-tender Ext: No  clubbing cyanosis, edema Neuro:  normal strength Skin: No rashes, warm and dry Psych: normal mood and behavior   Assessment/Plan Dyspnea on exertion/ Cough PFT's essentially normal with low TLC ( Most likely 2/2 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 Chase Caller had initially ordered. We will refer you to ENT to evaluate vocal cords for upper airway cough.  Continue your Protonix 40 mg daily. Continue Claritin daily Sips of water instead of throat clearing Sugar Free [employer] or [employer] originals for throat soothing. Delsym Cough syrup 5 cc's every 12 hours Non-sedating antihistamine of your choice daily ( Zyrtec, Allegra, Xyzol, Claritin ( Generic ok). We will give you a copy of the GERD diet. Continue to increase your exercise and work on weight loss with portion control.   Follow up with Judson Roch NP Dr. Chase Caller after FENO and ENT eval to evaluate. Please contact office for sooner follow up if symptoms do not improve or worsen or seek emergency care  Call us if you need [place] sooner.    This appointment was 30 min long with over 50% of the time in direct face-to-face patient care, assessment, plan of care, and follow-up.   Magdalen Spatz, NP 01/14/2020  5:55 PM

## 2020-01-23 ENCOUNTER — Encounter: Payer: Self-pay | Admitting: Internal Medicine

## 2020-01-23 ENCOUNTER — Other Ambulatory Visit: Payer: Self-pay

## 2020-01-23 ENCOUNTER — Ambulatory Visit (INDEPENDENT_AMBULATORY_CARE_PROVIDER_SITE_OTHER): Payer: Medicare Other | Admitting: Internal Medicine

## 2020-01-23 VITALS — BP 114/70 | HR 62 | Ht 70.0 in | Wt 226.8 lb

## 2020-01-23 DIAGNOSIS — I2510 Atherosclerotic heart disease of native coronary artery without angina pectoris: Secondary | ICD-10-CM

## 2020-01-23 DIAGNOSIS — R0609 Other forms of dyspnea: Secondary | ICD-10-CM

## 2020-01-23 DIAGNOSIS — R0600 Dyspnea, unspecified: Secondary | ICD-10-CM | POA: Diagnosis not present

## 2020-01-23 DIAGNOSIS — J387 Other diseases of larynx: Secondary | ICD-10-CM

## 2020-01-23 DIAGNOSIS — M25473 Effusion, unspecified ankle: Secondary | ICD-10-CM | POA: Diagnosis not present

## 2020-01-23 DIAGNOSIS — R05 Cough: Secondary | ICD-10-CM | POA: Diagnosis not present

## 2020-01-23 DIAGNOSIS — R059 Cough, unspecified: Secondary | ICD-10-CM

## 2020-01-23 LAB — NITRIC OXIDE: Nitric Oxide: 13

## 2020-01-23 NOTE — Progress Notes (Signed)
OV 11/20/2019  Subjective:  Patient ID: Joshua Warner, male , DOB: 10-Oct-1950 , age 69 y.o. , MRN: [id number] , ADDRESS: [place]  [id number]   11/20/2019 -   Chief Complaint  Patient presents with  . Consult   Chief complaint of chronic cough  HPI Joshua Warner 69 y.o. -presents for evaluation of chronic cough.  He 69 years old.  He has been relocated from [place] to [place] approximately 12 years ago.  While in [place] he is to have recurrent "head colds".  The sounds like recurrent sinusitis.  This persisted even in [place] but then after he started getting the flu shots this went away.  However he started getting winter bronchitis in [place].  Then approximately on September 08, 2018 3 days after getting his pneumonia vaccine second shot he developed sudden onset of chronic cough that is really not gone away.  It is persistent and stable since then.  In December 2020 he had COVID-19.  He does not recollect getting monoclonal antibody infusion.  He says the Covid did not affect his cough.  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  He was on ventilator and now he has survived and back to work.  Patient is really worried about his pulmonary issues.  He says that his cough associated with a tickle in the throat and sometimes when he talks a lot hoarse voice.  No diagnosis of asthma but there is associated wheezing sometimes at night.  Certainly talking a lot makes the cough worse.  Also associated with hoarseness.  He denies a postnasal drip but he does feel a tickle in the throat.  The cough does not wake him up in the middle of the night.  He was on a blood pressure medicine presumably lisinopril that was stopped at the onset of cough but this did not affect the cough.  He has acid reflux and Schatzki's ring that was dilated.  He believes his spring  allergies in [place].  He does have insidious onset of shortness of breath that is stable present on exertion relieved by rest.  Associated with visceral obesity and weight gain particularly in the pandemic.  CT scan of the chest shows elevated right hemidiaphragm.  CT scan also shows coronary artery calcification but I did not elicit if he has had a stress test.  He does not have chest pain.    CT CHEST 10/29/19 visualized and agree with the findings below. Lungs/Pleura: Elevation of the right hemidiaphragm with scarring in the right lower lobe and middle lobe, stable. Calcified granuloma in the left lower lobe. No suspicious pulmonary nodules or effusions. Previously seen 5 mm right lower lobe nodule no longer visualized.  Upper Abdomen: Imaging into the upper abdomen shows no acute findings. Calcifications in the liver and spleen compatible with old granulomatous disease.  Musculoskeletal: Chest wall soft tissues are unremarkable. No acute bony abnormality.  IMPRESSION: Previously seen 5 mm right lower lobe pulmonary nodule no longer visualized. No suspicious new or enlarging pulmonary nodules.  Old granulomatous disease.  Stable elevation of the right hemidiaphragm.  Heavily calcified coronary arteries.  4.6 cm ascending thoracic aortic aneurysm, stable since prior study.  Aortic Atherosclerosis (ICD10-I70.0).   Electronically Signed   By: Rolm Baptise M.D.   On: 10/29/2019 15:03    01/14/2020  Pt. Presents for follow up.  He is being evaluated for asthma, allergies. He states his hoarseness and cough come and go. He states he sometimes gas this in the morning, and sometimes in the evening.He is compliant with his protonix daily. He does have reflux. He has noticed that when he has weight gain, this makes his GERD worse. He developed a cough 3 days after he had a pneumovax March of 2020. He feels this has been his trigger. He is working on weight loss. We  discussed going to the gym more frequently and seeing a nutritionist for weight loss and diet management. He is using cough drops with menthol, which may be making his cough worse. He does endorse wheezing that goes away on its own. He denies fever, chest pain, orthopnea or hemoptysis.   Test Results: Eosinophils> WNL Respiratory Allergies >> WNL FENO >> pending -> 13 on 01/23/2020  ENT referral >> Pending  PFT's 01/09/2020 PFT Results Latest Ref Rng & Units 01/09/2020  FVC-Pre L 1.97  FVC-Predicted Pre % 45  FVC-Post L 1.83  FVC-Predicted Post % 41  Pre FEV1/FVC % % 80  Post FEV1/FCV % % 85  FEV1-Pre L 1.58  FEV1-Predicted Pre % 48  FEV1-Post L 1.55  DLCO UNC% % 80  DLCO COR %Predicted % 115  TLC L 4.28  TLC % Predicted % 62  RV % Predicted % 88         OV 01/23/2020  Subjective:  Patient ID: Joshua Warner, male , DOB: October 06, 1950 , age 42 y.o. , MRN: [id number] , ADDRESS: [place] [id number]   01/23/2020 -   Chief Complaint  Patient presents with  . Follow-up    Pt states he has been doing good since last visit and states that his cough is also improved.     HPI Joshua Warner 69 y.o. -presents for follow-up of his chronic cough.  Cough is better after visiting with nurse practitioner.  He states Robitussin did not help but Delsym is helping.  He still does occasionally clear his throat.  In terms of shortness of breath he is back exercising and it is somewhat better but it is still present.  He feels a lack of exercise during COVID-19 pandemic major shortness of breath come back.  He has coronary stents and is following up with a [facility]ologist at [facility].  He is yet to see ENT for his cough.  Not sure why.  He had exhaled nitric oxide testing today and is normal.  Previously blood allergy testing was normal including IgE was normal.  He is interested in seeing ENT again.  He complained about some ankle edema on the left side.  This is dependent.   On exam he seems to have some varicose veins.  There is no warmth or redness.     Asthma Control Test ACT Total Score  01/23/2020 18     Feno was 13  ROS - per HPI     has a past medical history of Arthritis, CHF (congestive heart failure) ([facility]), Coronary artery disease, GERD (gastroesophageal reflux disease), Hyperlipidemia, Hypertension (since 2007), Kidney stones (2009), Myocardial infarction ([place]) (12/2015), and Oxygen deficiency.   reports that he has never smoked. He has never used smokeless tobacco.  Past Surgical History:  Procedure Laterality Date  . CORONARY ANGIOPLASTY WITH STENT PLACEMENT  12/2015  . KNEE ARTHROSCOPY  12/31/2010   Right knee   . TONSILLECTOMY  1957    Allergies  Allergen Reactions  . Celebrex [Celecoxib]  REACTION: rash REACTION: rash  . Sulfa Antibiotics     Other reaction(s): Unknown  . Sulfonamide Derivatives     REACTION: hives  . Lisinopril Other (See Comments)    Cough    Immunization History  Administered Date(s) Administered  . Fluad Quad(high Dose 65+) 03/13/2019  . Influenza Split 03/28/2012, 03/28/2012  . Influenza Whole 04/28/2009, 03/28/2010  . Influenza, High Dose Seasonal PF 03/04/2017  . Influenza, Seasonal, Injecte, Preservative Fre 03/22/2014, 03/28/2015, 02/20/2016  . Influenza,inj,Quad PF,6+ Mos 03/22/2014, 03/28/2015, 02/20/2016, 03/13/2018  . Influenza-Unspecified 04/19/2013  . Moderna SARS-COVID-2 Vaccination 07/30/2019, 08/27/2019  . PFIZER SARS-COV-2 Vaccination 07/30/2019  . Pneumococcal Conjugate-13 03/04/2017  . Pneumococcal Polysaccharide-23 09/11/2018  . Td 04/28/2008  . Tdap 09/13/2018  . Zoster 12/14/2013    Family History  Problem Relation Age of Onset  . Heart disease Father   . Hypertension Mother   . Prostate cancer Maternal Uncle        Deceased  . Depression Maternal Uncle   . Cancer Paternal Grandfather   . Cancer Paternal Grandmother   . Colon cancer Neg Hx   . Stomach cancer Neg  Hx   . Colon polyps Neg Hx   . Esophageal cancer Neg Hx      Current Outpatient Medications:  .  ASPIRIN ADULT PO, Take 81 mg by mouth. , Disp: , Rfl:  .  atorvastatin (LIPITOR) 80 MG tablet, Take 80 mg by mouth at bedtime., Disp: , Rfl:  .  b complex vitamins tablet, Take 1 tablet by mouth daily., Disp: , Rfl:  .  Cholecalciferol 125 MCG (5000 UT) capsule, Take 5,000 Units by mouth daily., Disp: , Rfl:  .  Glucosamine-Chondroit-Vit C-Mn (GLUCOSAMINE CHONDR 1500 COMPLX) CAPS, Take by mouth daily.  , Disp: , Rfl:  .  loratadine (CLARITIN) 10 MG tablet, Take 10 mg by mouth daily., Disp: , Rfl:  .  losartan (COZAAR) 50 MG tablet, TAKE 1 TABLET BY MOUTH EVERY DAY, Disp: 90 tablet, Rfl: 0 .  metoprolol tartrate (LOPRESSOR) 25 MG tablet, Take 12.5 mg by mouth 2 (two) times daily., Disp: , Rfl:  .  Multiple Vitamin (MULTIVITAMIN) tablet, Take 1 tablet by mouth daily.  , Disp: , Rfl:  .  nitroGLYCERIN (NITROSTAT) 0.4 MG SL tablet, PLACE 1 TABLET (0.4 MG TOTAL) UNDER THE TONGUE EVERY 5 (FIVE) MINUTES AS NEEDED., Disp: 25 tablet, Rfl: 1 .  pantoprazole (PROTONIX) 40 MG tablet, Take 1 tablet (40 mg total) by mouth daily., Disp: 30 tablet, Rfl: 11 .  vitamin C (ASCORBIC ACID) 500 MG tablet, Take 1,000 mg by mouth daily., Disp: , Rfl:       Objective:   Vitals:   01/23/20 0935  BP: 114/70  Pulse: 62  SpO2: 95%  Weight: (!) 102.9 kg  Height: 5\' 10"  (1.778 m)    Estimated body mass index is 32.54 kg/m as calculated from the following:   Height as of this encounter: 5\' 10"  (1.778 m).   Weight as of this encounter: 102.9 kg.  @WEIGHTCHANGE @  Filed Weights   01/23/20 0935  Weight: (!) 102.9 kg     Physical Exam Essentially nonfocal exam.  Obesity visceral present.  Clear to auscultation bilaterally.  Mild varicose veins present particularly on the left side.  No warmth or tenderness or redness.      Assessment:       ICD-10-CM   1. Cough  R05 Nitric oxide    Ambulatory referral to  ENT  2. Irritable larynx  J38.7 Ambulatory referral to ENT  3. Dyspnea on exertion  R06.00 Ambulatory referral to ENT  4. Ankle edema  M25.473        Plan:     Patient Instructions  Cough - Plan: Nitric oxide Irritable larynx   Chronic cough likely due to cough neuropathy/irritable throat No evidence of asthma or allergies or intrinsic lung disease  Plan -Refer to ENT near [facility] the safer office can do it] -not sure what happened with referral last visit -Okay to take Delsym as needed -If you are interested you can do voice rehabilitation at [facility] health for a few visits -Hold off on gabapentin or Lyrica as discussed -Follow expectantly otherwise  Dyspnea on exertion  -Likely due to weight and cardiac issues and elevated right diaphragm  Plan -According to primary care physician and your cardiologist at [place] issue of pedal edema around the ankle particularly on the left side  This is likely due to varicose veins.  Plan -If persists or getting worse talk to primary care physician  Follow-up -1 year or sooner if needed     SIGNATURE    Dr. Brand Males, M.D., F.C.C.P,  Pulmonary and Critical Care Medicine Staff Physician, [place] Director - Interstitial Lung Disease  Program  Pulmonary [place] at [place], [place], [address]  Pager: [phone], If no answer or between  15:00h - 7:00h: call [phone] Telephone: [phone]  10:09 AM 01/23/2020

## 2020-01-23 NOTE — Patient Instructions (Addendum)
Cough - Plan: Nitric oxide Irritable larynx   Chronic cough likely due to cough neuropathy/irritable throat No evidence of asthma or allergies or intrinsic lung disease  Plan -Refer to ENT near [facility] the safer office can do it] -not sure what happened with referral last visit -Okay to take Delsym as needed -If you are interested you can do voice rehabilitation at [facility] health for a few visits -Hold off on gabapentin or Lyrica as discussed -Follow expectantly otherwise  Dyspnea on exertion  -Likely due to weight and cardiac issues and elevated right diaphragm  Plan -According to primary care physician and your cardiologist at [place] issue of pedal edema around the ankle particularly on the left side  This is likely due to varicose veins.  Plan -If persists or getting worse talk to primary care physician  Follow-up -1 year or sooner if needed

## 2020-01-28 ENCOUNTER — Encounter: Payer: Self-pay | Admitting: Family Medicine

## 2020-01-28 ENCOUNTER — Other Ambulatory Visit: Payer: Self-pay

## 2020-01-28 ENCOUNTER — Ambulatory Visit (INDEPENDENT_AMBULATORY_CARE_PROVIDER_SITE_OTHER): Payer: Medicare Other | Admitting: Family Medicine

## 2020-01-28 VITALS — BP 114/81 | HR 65 | Wt 223.0 lb

## 2020-01-28 DIAGNOSIS — I2510 Atherosclerotic heart disease of native coronary artery without angina pectoris: Secondary | ICD-10-CM | POA: Diagnosis not present

## 2020-01-28 DIAGNOSIS — M542 Cervicalgia: Secondary | ICD-10-CM | POA: Diagnosis not present

## 2020-01-28 DIAGNOSIS — M9903 Segmental and somatic dysfunction of lumbar region: Secondary | ICD-10-CM | POA: Diagnosis not present

## 2020-01-28 DIAGNOSIS — I10 Essential (primary) hypertension: Secondary | ICD-10-CM | POA: Diagnosis not present

## 2020-01-28 DIAGNOSIS — M25552 Pain in left hip: Secondary | ICD-10-CM | POA: Diagnosis not present

## 2020-01-28 DIAGNOSIS — I878 Other specified disorders of veins: Secondary | ICD-10-CM

## 2020-01-28 DIAGNOSIS — M25511 Pain in right shoulder: Secondary | ICD-10-CM | POA: Diagnosis not present

## 2020-01-28 DIAGNOSIS — M545 Low back pain: Secondary | ICD-10-CM | POA: Diagnosis not present

## 2020-01-28 DIAGNOSIS — M9901 Segmental and somatic dysfunction of cervical region: Secondary | ICD-10-CM | POA: Diagnosis not present

## 2020-01-28 DIAGNOSIS — M62830 Muscle spasm of back: Secondary | ICD-10-CM | POA: Diagnosis not present

## 2020-01-28 MED ORDER — LISINOPRIL-HYDROCHLOROTHIAZIDE 20-12.5 MG PO TABS: 1.0000 | ORAL_TABLET | Freq: Every day | ORAL | 0 refills | Status: DC

## 2020-01-28 NOTE — Progress Notes (Signed)
Established Patient Office Visit  Subjective:  Patient ID: Joshua Warner, male    DOB: 04/01/1951  Age: 69 y.o. MRN: [id number]  CC:  Chief Complaint  Patient presents with  . Edema    left ankle swelling     HPI Joshua Warner presents for left ankle swelling that started months ago. He says she noticed it more after he started getting to the gym and exercising more regularly.  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  Is always worse on the left foot compared to the right.  He says the other night it was so swollen that he actually had some cramping in the middle the night.  Last renal function was in May and was normal.   Past Medical History:  Diagnosis Date  . Arthritis   . CHF (congestive heart failure) ([facility])    stents  . Coronary artery disease    stents  . GERD (gastroesophageal reflux disease)   . Hyperlipidemia   . Hypertension since 2007  . Kidney stones 2009  . Myocardial infarction ([place]) 12/2015  . Oxygen deficiency    only during heartattack    Past Surgical History:  Procedure Laterality Date  . CORONARY ANGIOPLASTY WITH STENT PLACEMENT  12/2015  . KNEE ARTHROSCOPY  12/31/2010   Right knee   . TONSILLECTOMY  1957    Family History  Problem Relation Age of Onset  . Heart disease Father   . Hypertension Mother   . Prostate cancer Maternal Uncle        Deceased  . Depression Maternal Uncle   . Cancer Paternal Grandfather   . Cancer Paternal Grandmother   . Colon cancer Neg Hx   . Stomach cancer Neg Hx   . Colon polyps Neg Hx   . Esophageal cancer Neg Hx     Social History   Socioeconomic History  . Marital status: Married    Spouse name: [name]  . Number of children: 2  . Years of education: 66  . Highest education level: Some college, no degree  Occupational History  .  Occupation: Retired    Fish farm manager: [employer]  Tobacco Use  . Smoking status: Never Smoker  . Smokeless tobacco: Never Used  Vaping Use  . Vaping Use: Never used  Substance and Sexual Activity  . Alcohol use: Yes    Alcohol/week: 1.0 standard drink    Types: 1 Glasses of wine per week    Comment: occasionally wine  . Drug use: Never  . 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  Other Topics Concern  . Not on file  Social History Narrative   No regular exercise. Retired from [employer] Strain:   . Difficulty of Paying Living Expenses:   Food Insecurity:   . Worried About Charity fundraiser in the Last Year:   . Arboriculturist in the Last Year:   Transportation Needs:   . Film/video editor (Medical):   Marland Kitchen Lack of Transportation (Non-Medical):   Physical Activity:   . Days of Exercise per Week:   .  Minutes of Exercise per Session:   Stress:   . Feeling of Stress :   Social Connections:   . Frequency of Communication with Friends and Family:   . Frequency of Social Gatherings with Friends and Family:   . Attends Religious Services:   . Active Member of Clubs or Organizations:   . Attends Archivist Meetings:   Marland Kitchen Marital Status:   Intimate Partner Violence:   . Fear of Current or Ex-Partner:   . Emotionally Abused:   Marland Kitchen Physically Abused:   . Sexually Abused:     Outpatient Medications Prior to Visit  Medication Sig Dispense Refill  . ASPIRIN ADULT PO Take 81 mg by mouth.     Marland Kitchen atorvastatin (LIPITOR) 80 MG tablet Take 80 mg by mouth at bedtime.    Marland Kitchen b complex vitamins tablet Take 1 tablet by mouth daily.    . Cholecalciferol 125 MCG (5000 UT) capsule Take 5,000 Units by mouth daily.    . Glucosamine-Chondroit-Vit C-Mn (GLUCOSAMINE CHONDR 1500  COMPLX) CAPS Take by mouth daily.      Marland Kitchen loratadine (CLARITIN) 10 MG tablet Take 10 mg by mouth daily.    . metoprolol tartrate (LOPRESSOR) 25 MG tablet Take 12.5 mg by mouth 2 (two) times daily.    . Multiple Vitamin (MULTIVITAMIN) tablet Take 1 tablet by mouth daily.      . nitroGLYCERIN (NITROSTAT) 0.4 MG SL tablet PLACE 1 TABLET (0.4 MG TOTAL) UNDER THE TONGUE EVERY 5 (FIVE) MINUTES AS NEEDED. 25 tablet 1  . pantoprazole (PROTONIX) 40 MG tablet Take 1 tablet (40 mg total) by mouth daily. 30 tablet 11  . vitamin C (ASCORBIC ACID) 500 MG tablet Take 1,000 mg by mouth daily.    Marland Kitchen losartan (COZAAR) 50 MG tablet TAKE 1 TABLET BY MOUTH EVERY DAY 90 tablet 0   No facility-administered medications prior to visit.    Allergies  Allergen Reactions  . Celebrex [Celecoxib]     REACTION: rash REACTION: rash  . Sulfa Antibiotics     Other reaction(s): Unknown  . Sulfonamide Derivatives     REACTION: hives  . Lisinopril Other (See Comments)    Cough    ROS Review of Systems    Objective:    Physical Exam Constitutional:      Appearance: He is well-developed.  HENT:     Head: Normocephalic and atraumatic.  Cardiovascular:     Rate and Rhythm: Normal rate and regular rhythm.     Heart sounds: Normal heart sounds.  Pulmonary:     Effort: Pulmonary effort is normal.     Breath sounds: Normal breath sounds.  Musculoskeletal:     Comments: Significant lower extremity swelling. No erythema.    Skin:    General: Skin is warm and dry.  Neurological:     Mental Status: He is alert and oriented to person, place, and time.  Psychiatric:        Behavior: Behavior normal.     BP 114/81 (BP Location: Left Arm, Patient Position: Sitting)   Pulse 65   Wt 223 lb (101.2 kg)   SpO2 95%   BMI 32.00 kg/m  Wt Readings from Last 3 Encounters:  01/28/20 223 lb (101.2 kg)  01/23/20 (!) 226 lb 12.8 oz (102.9 kg)  01/14/20 228 lb 6.4 oz (103.6 kg)     Health Maintenance Due  Topic Date  Due  . INFLUENZA VACCINE  01/27/2020    There are no preventive care reminders to  display for this patient.  Lab Results  Component Value Date   TSH 1.05 09/05/2018   Lab Results  Component Value Date   WBC 6.6 11/27/2019   HGB 17.6 (H) 11/27/2019   HCT 51.4 11/27/2019   MCV 91.9 11/27/2019   PLT 200.0 11/27/2019   Lab Results  Component Value Date   NA 139 09/11/2019   K 4.3 09/11/2019   CO2 24 09/11/2019   GLUCOSE 98 09/11/2019   BUN 18 09/11/2019   CREATININE 1.03 09/11/2019   BILITOT 0.6 09/11/2019   ALKPHOS 61 08/20/2016   AST 25 09/11/2019   ALT 25 09/11/2019   PROT 6.0 (L) 09/11/2019   ALBUMIN 4.1 08/20/2016   CALCIUM 8.8 09/11/2019   Lab Results  Component Value Date   CHOL 116 09/11/2019   Lab Results  Component Value Date   HDL 44 09/11/2019   Lab Results  Component Value Date   LDLCALC 57 09/11/2019   Lab Results  Component Value Date   TRIG 71 09/11/2019   Lab Results  Component Value Date   CHOLHDL 2.6 09/11/2019   No results found for: HGBA1C    Assessment & Plan:   Problem List Items Addressed This Visit      Cardiovascular and Mediastinum   HYPERTENSION, BENIGN - Primary    Well controlled.        Relevant Medications   lisinopril-hydrochlorothiazide (ZESTORETIC) 20-12.5 MG tablet   Other Relevant Orders   BASIC METABOLIC PANEL WITH GFR     Other   Venous stasis    Discussed diagnosis most consistent with venous stasis he has normal renal function.  No sign of heart failure or other conditions on exam today.  In fact his ankles actually look pretty good.  We discussed wearing compression stockings in addition to adding a low-dose diuretic to his blood pressure pill.  He was actually wanting to switch from losartan back to lisinopril anyway.  Though I did encourage him to watch out make sure it does not increase his chronic cough.  I believe that was one of the reasons we had originally switch him off.  If the swelling gets worse  or is not improving then please let us know.  I did ask him to go for a BMP in about a week.          Meds ordered this encounter  Medications  . lisinopril-hydrochlorothiazide (ZESTORETIC) 20-12.5 MG tablet    Sig: Take 1 tablet by mouth daily.    Dispense:  90 tablet    Refill:  0    Follow-up: No follow-ups on file.   Time spent 25 minutes in encounter.     Beatrice Lecher, MD

## 2020-01-28 NOTE — Assessment & Plan Note (Signed)
Well controlled 

## 2020-01-28 NOTE — Assessment & Plan Note (Signed)
Discussed diagnosis most consistent with venous stasis he has normal renal function.  No sign of heart failure or other conditions on exam today.  In fact his ankles actually look pretty good.  We discussed wearing compression stockings in addition to adding a low-dose diuretic to his blood pressure pill.  He was actually wanting to switch from losartan back to lisinopril anyway.  Though I did encourage him to watch out make sure it does not increase his chronic cough.  I believe that was one of the reasons we had originally switch him off.  If the swelling gets worse or is not improving then please let us know.  I did ask him to go for a BMP in about a week.

## 2020-01-28 NOTE — Patient Instructions (Signed)
Plantar Fasciitis Rehab Ask your health care provider which exercises are safe for you. Do exercises exactly as told by your health care provider and adjust them as directed. It is normal to feel mild stretching, pulling, tightness, or discomfort as you do these exercises. Stop right away if you feel sudden pain or your pain gets worse. Do not begin these exercises until told by your health care provider. Stretching and range-of-motion exercises These exercises warm up your muscles and joints and improve the movement and flexibility of your foot. These exercises also help to relieve pain. Plantar fascia stretch  1. Sit with your left / right leg crossed over your opposite knee. 2. Hold your heel with one hand with that thumb near your arch. With your other hand, hold your toes and gently pull them back toward the top of your foot. You should feel a stretch on the bottom of your toes or your foot (plantar fascia) or both. 3. Hold this stretch for__________ seconds. 4. Slowly release your toes and return to the starting position. Repeat __________ times. Complete this exercise __________ times a day. Gastrocnemius stretch, standing This exercise is also called a calf (gastroc) stretch. It stretches the muscles in the back of the upper calf. 1. Stand with your hands against a wall. 2. Extend your left / right leg behind you, and bend your front knee slightly. 3. Keeping your heels on the floor and your back knee straight, shift your weight toward the wall. Do not arch your back. You should feel a gentle stretch in your upper left / right calf. 4. Hold this position for __________ seconds. Repeat __________ times. Complete this exercise __________ times a day. Soleus stretch, standing This exercise is also called a calf (soleus) stretch. It stretches the muscles in the back of the lower calf. 1. Stand with your hands against a wall. 2. Extend your left / right leg behind you, and bend your front  knee slightly. 3. Keeping your heels on the floor, bend your back knee and shift your weight slightly over your back leg. You should feel a gentle stretch deep in your lower calf. 4. Hold this position for __________ seconds. Repeat __________ times. Complete this exercise __________ times a day. Gastroc and soleus stretch, standing step This exercise stretches the muscles in the back of the lower leg. These muscles are in the upper calf (gastrocnemius) and the lower calf (soleus). 1. Stand with the ball of your left / right foot on a step. The ball of your foot is on the walking surface, right under your toes. 2. Keep your other foot firmly on the same step. 3. Hold on to the wall or a railing for balance. 4. Slowly lift your other foot, allowing your body weight to press your left / right heel down over the edge of the step. You should feel a stretch in your left / right calf. 5. Hold this position for __________ seconds. 6. Return both feet to the step. 7. Repeat this exercise with a slight bend in your left / right knee. Repeat __________ times with your left / right knee straight and __________ times with your left / right knee bent. Complete this exercise __________ times a day. Balance exercise This exercise builds your balance and strength control of your arch to help take pressure off your plantar fascia. Single leg stand If this exercise is too easy, you can try it with your eyes closed or while standing on a pillow. 1.   Without shoes, stand near a railing or in a doorway. You may hold on to the railing or door frame as needed. 2. Stand on your left / right foot. Keep your big toe down on the floor and try to keep your arch lifted. Do not let your foot roll inward. 3. Hold this position for __________ seconds. Repeat __________ times. Complete this exercise __________ times a day. This information is not intended to replace advice given to you by your health care provider. Make sure  you discuss any questions you have with your health care provider. Document Revised: 10/05/2018 Document Reviewed: 04/12/2018 Elsevier Patient Education  2020 [employer]  

## 2020-02-05 DIAGNOSIS — R05 Cough: Secondary | ICD-10-CM | POA: Diagnosis not present

## 2020-02-05 DIAGNOSIS — I10 Essential (primary) hypertension: Secondary | ICD-10-CM | POA: Diagnosis not present

## 2020-02-06 LAB — BASIC METABOLIC PANEL WITH GFR
BUN: 18 mg/dL (ref 7–25)
CO2: 26 mmol/L (ref 20–32)
Calcium: 9.3 mg/dL (ref 8.6–10.3)
Chloride: 104 mmol/L (ref 98–110)
Creat: 1.22 mg/dL (ref 0.70–1.25)
GFR, Est African American: 70 mL/min/{1.73_m2} (ref >=60)
GFR, Est Non African American: 61 mL/min/{1.73_m2} (ref >=60)
Glucose, Bld: 106 mg/dL (ref 65–139)
Potassium: 4.4 mmol/L (ref 3.5–5.3)
Sodium: 138 mmol/L (ref 135–146)

## 2020-02-06 NOTE — Progress Notes (Signed)
Overall BMP good. Kidney function has decreased a little- will need to keep an eye on this.

## 2020-02-14 ENCOUNTER — Telehealth: Payer: Self-pay | Admitting: Family Medicine

## 2020-02-14 NOTE — Telephone Encounter (Signed)
MyChart note sent

## 2020-02-15 NOTE — Telephone Encounter (Signed)
Patient advised.

## 2020-02-19 LAB — PULMONARY FUNCTION TEST
DL/VA % pred: 115 %
DL/VA: 4.75 ml/min/mmHg/L
DLCO cor % pred: 74 %
DLCO cor: 19.07 ml/min/mmHg
DLCO unc % pred: 80 %
DLCO unc: 20.51 ml/min/mmHg
FEF 25-75 Post: 1.46 L/sec
FEF 25-75 Pre: 1.55 L/sec
FEF2575-%Change-Post: -5 %
FEF2575-%Pred-Post: 57 %
FEF2575-%Pred-Pre: 61 %
FEV1-%Change-Post: -1 %
FEV1-%Pred-Post: 47 %
FEV1-%Pred-Pre: 48 %
FEV1-Post: 1.55 L
FEV1-Pre: 1.58 L
FEV1FVC-%Change-Post: 5 %
FEV1FVC-%Pred-Pre: 108 %
FEV6-%Change-Post: -7 %
FEV6-%Pred-Post: 44 %
FEV6-%Pred-Pre: 47 %
FEV6-Post: 1.83 L
FEV6-Pre: 1.97 L
FEV6FVC-%Pred-Post: 105 %
FEV6FVC-%Pred-Pre: 105 %
FVC-%Change-Post: -7 %
FVC-%Pred-Post: 41 %
FVC-%Pred-Pre: 45 %
FVC-Post: 1.83 L
FVC-Pre: 1.97 L
Post FEV1/FVC ratio: 85 %
Post FEV6/FVC ratio: 100 %
Pre FEV1/FVC ratio: 80 %
Pre FEV6/FVC Ratio: 100 %
RV % pred: 88 %
RV: 2.06 L
TLC % pred: 62 %
TLC: 4.28 L

## 2020-02-21 ENCOUNTER — Other Ambulatory Visit: Payer: Self-pay | Admitting: Family Medicine

## 2020-02-21 NOTE — Telephone Encounter (Signed)
Last phone note states he was going to restart, but want to make sure since also on lisinopril hctz according to chart. Please advise.

## 2020-02-25 DIAGNOSIS — M25552 Pain in left hip: Secondary | ICD-10-CM | POA: Diagnosis not present

## 2020-02-25 DIAGNOSIS — M9903 Segmental and somatic dysfunction of lumbar region: Secondary | ICD-10-CM | POA: Diagnosis not present

## 2020-02-25 DIAGNOSIS — M9901 Segmental and somatic dysfunction of cervical region: Secondary | ICD-10-CM | POA: Diagnosis not present

## 2020-02-25 DIAGNOSIS — M542 Cervicalgia: Secondary | ICD-10-CM | POA: Diagnosis not present

## 2020-02-25 DIAGNOSIS — M62830 Muscle spasm of back: Secondary | ICD-10-CM | POA: Diagnosis not present

## 2020-02-25 DIAGNOSIS — M25511 Pain in right shoulder: Secondary | ICD-10-CM | POA: Diagnosis not present

## 2020-02-25 DIAGNOSIS — M545 Low back pain: Secondary | ICD-10-CM | POA: Diagnosis not present

## 2020-03-17 ENCOUNTER — Encounter: Payer: Self-pay | Admitting: Family Medicine

## 2020-03-17 ENCOUNTER — Ambulatory Visit (INDEPENDENT_AMBULATORY_CARE_PROVIDER_SITE_OTHER): Payer: Medicare Other | Admitting: Family Medicine

## 2020-03-17 ENCOUNTER — Other Ambulatory Visit: Payer: Self-pay

## 2020-03-17 VITALS — BP 121/59 | HR 55 | Ht 70.0 in | Wt 227.0 lb

## 2020-03-17 DIAGNOSIS — R7309 Other abnormal glucose: Secondary | ICD-10-CM | POA: Diagnosis not present

## 2020-03-17 DIAGNOSIS — I2510 Atherosclerotic heart disease of native coronary artery without angina pectoris: Secondary | ICD-10-CM | POA: Diagnosis not present

## 2020-03-17 DIAGNOSIS — R059 Cough, unspecified: Secondary | ICD-10-CM

## 2020-03-17 DIAGNOSIS — E8809 Other disorders of plasma-protein metabolism, not elsewhere classified: Secondary | ICD-10-CM | POA: Insufficient documentation

## 2020-03-17 DIAGNOSIS — D692 Other nonthrombocytopenic purpura: Secondary | ICD-10-CM | POA: Diagnosis not present

## 2020-03-17 DIAGNOSIS — Z23 Encounter for immunization: Secondary | ICD-10-CM | POA: Diagnosis not present

## 2020-03-17 DIAGNOSIS — I10 Essential (primary) hypertension: Secondary | ICD-10-CM

## 2020-03-17 DIAGNOSIS — R05 Cough: Secondary | ICD-10-CM | POA: Diagnosis not present

## 2020-03-17 MED ORDER — HYDROCHLOROTHIAZIDE 25 MG PO TABS: 25.0000 mg | ORAL_TABLET | Freq: Every day | ORAL | 0 refills | Status: DC

## 2020-03-17 NOTE — Assessment & Plan Note (Signed)
Benign condition

## 2020-03-17 NOTE — Assessment & Plan Note (Signed)
Due to recheck protein levels.

## 2020-03-17 NOTE — Assessment & Plan Note (Addendum)
Improved but not resolved on ARB. Will d/o losartan and switch to HCTZ. Call if BP not well controlled at home.  Continue PPI and consider return to ENT if not improving.

## 2020-03-17 NOTE — Assessment & Plan Note (Addendum)
Well controlled. Change ARB to HCTZ bc of persistant cough. Follow up in  6 months.

## 2020-03-17 NOTE — Progress Notes (Signed)
Established Patient Office Visit  Subjective:  Patient ID: Joshua Warner, male    DOB: June 12, 1951  Age: 69 y.o. MRN: [id number]  CC:  Chief Complaint  Patient presents with  . Hypertension    HPI Joshua Warner presents for   Hypertension- Pt denies chest pain, SOB, dizziness, or heart palpitations.  Taking meds as directed w/o problems.  Denies medication side effects.  Says since switching to losartan his cough is much better but not resolved. Still using cough med 1-2 x a day.  Did see ENT as well. Also on PPI and that helps too. Occ cough is triggered after eating. He did  Purchase compression stocking.        Past Medical History:  Diagnosis Date  . Arthritis   . CHF (congestive heart failure) (HCC)    stents  . Coronary artery disease    stents  . GERD (gastroesophageal reflux disease)   . Hyperlipidemia   . Hypertension since 2007  . Kidney stones 2009  . Myocardial infarction ([place]) 12/2015  . Oxygen deficiency    only during heartattack    Past Surgical History:  Procedure Laterality Date  . CORONARY ANGIOPLASTY WITH STENT PLACEMENT  12/2015  . KNEE ARTHROSCOPY  12/31/2010   Right knee   . TONSILLECTOMY  1957    Family History  Problem Relation Age of Onset  . Heart disease Father   . Hypertension Mother   . Prostate cancer Maternal Uncle        Deceased  . Depression Maternal Uncle   . Cancer Paternal Grandfather   . Cancer Paternal Grandmother   . Colon cancer Neg Hx   . Stomach cancer Neg Hx   . Colon polyps Neg Hx   . Esophageal cancer Neg Hx     Social History   Socioeconomic History  . Marital status: Married    Spouse name: [name]  . Number of children: 2  . Years of education: 53  . Highest education level: Some college, no degree  Occupational History  . Occupation: Retired    Fish farm manager: [place] [employer]  Tobacco Use  . Smoking status: Never Smoker  . Smokeless tobacco: Never Used  Vaping Use  . Vaping Use: Never used  Substance  and Sexual Activity  . Alcohol use: Yes    Alcohol/week: 1.0 standard drink    Types: 1 Glasses of wine per week    Comment: occasionally wine  . Drug use: Never  . 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  Other Topics Concern  . Not on file  Social History Narrative   No regular exercise. Retired from [employer] Strain:   . Difficulty of Paying Living Expenses: Not on file  Food Insecurity:   . Worried About Charity fundraiser in the Last Year: Not on file  . Ran Out of Food in the Last Year: Not on file  Transportation Needs:   . Lack of Transportation (Medical): Not on file  . Lack of Transportation (Non-Medical): Not on file  Physical Activity:   . Days of Exercise per Week: Not on file  . Minutes of Exercise per Session: Not on file  Stress:   . Feeling of Stress : Not on file  Social Connections:   . Frequency  of Communication with Friends and Family: Not on file  . Frequency of Social Gatherings with Friends and Family: Not on file  . Attends Religious Services: Not on file  . Active Member of Clubs or Organizations: Not on file  . Attends Archivist Meetings: Not on file  . Marital Status: Not on file  Intimate Partner Violence:   . Fear of Current or Ex-Partner: Not on file  . Emotionally Abused: Not on file  . Physically Abused: Not on file  . Sexually Abused: Not on file    Outpatient Medications Prior to Visit  Medication Sig Dispense Refill  . ASPIRIN ADULT PO Take 81 mg by mouth.     Marland Kitchen atorvastatin (LIPITOR) 80 MG tablet Take 80 mg by mouth at bedtime.    Marland Kitchen b complex vitamins tablet Take 1 tablet by mouth daily.    . Cholecalciferol 125 MCG (5000 UT) capsule Take 5,000 Units by mouth daily.    . Glucosamine-Chondroit-Vit C-Mn  (GLUCOSAMINE CHONDR 1500 COMPLX) CAPS Take by mouth daily.      Marland Kitchen loratadine (CLARITIN) 10 MG tablet Take 10 mg by mouth daily.    . metoprolol tartrate (LOPRESSOR) 25 MG tablet Take 12.5 mg by mouth 2 (two) times daily.    . Multiple Vitamin (MULTIVITAMIN) tablet Take 1 tablet by mouth daily.      . nitroGLYCERIN (NITROSTAT) 0.4 MG SL tablet PLACE 1 TABLET (0.4 MG TOTAL) UNDER THE TONGUE EVERY 5 (FIVE) MINUTES AS NEEDED. 25 tablet 1  . pantoprazole (PROTONIX) 40 MG tablet Take 1 tablet (40 mg total) by mouth daily. 30 tablet 11  . vitamin C (ASCORBIC ACID) 500 MG tablet Take 1,000 mg by mouth daily.    Marland Kitchen losartan (COZAAR) 50 MG tablet TAKE 1 TABLET BY MOUTH EVERY DAY 90 tablet 0   No facility-administered medications prior to visit.    Allergies  Allergen Reactions  . Celebrex [Celecoxib]     REACTION: rash REACTION: rash  . Sulfa Antibiotics     Other reaction(s): Unknown  . Sulfonamide Derivatives     REACTION: hives  . Lisinopril Other (See Comments)    Cough    ROS Review of Systems    Objective:    Physical Exam Constitutional:      Appearance: He is well-developed.  HENT:     Head: Normocephalic and atraumatic.  Cardiovascular:     Rate and Rhythm: Normal rate and regular rhythm.     Heart sounds: Normal heart sounds.  Pulmonary:     Effort: Pulmonary effort is normal.     Breath sounds: Normal breath sounds.  Skin:    General: Skin is warm and dry.     Comments: Bruising on forearms.   Neurological:     Mental Status: He is alert and oriented to person, place, and time.  Psychiatric:        Behavior: Behavior normal.     BP (!) 121/59   Pulse (!) 55   Ht 5\' 10"  (1.778 m)   Wt 227 lb (103 kg)   SpO2 96%   BMI 32.57 kg/m  Wt Readings from Last 3 Encounters:  03/17/20 227 lb (103 kg)  01/28/20 223 lb (101.2 kg)  01/23/20 (!) 226 lb 12.8 oz (102.9 kg)     There are no preventive care reminders to display for this patient.  There are no  preventive care reminders to display for this patient.  Lab Results  Component Value Date  TSH 1.05 09/05/2018   Lab Results  Component Value Date   WBC 6.6 11/27/2019   HGB 17.6 (H) 11/27/2019   HCT 51.4 11/27/2019   MCV 91.9 11/27/2019   PLT 200.0 11/27/2019   Lab Results  Component Value Date   NA 138 02/05/2020   K 4.4 02/05/2020   CO2 26 02/05/2020   GLUCOSE 106 02/05/2020   BUN 18 02/05/2020   CREATININE 1.22 02/05/2020   BILITOT 0.6 09/11/2019   ALKPHOS 61 08/20/2016   AST 25 09/11/2019   ALT 25 09/11/2019   PROT 6.0 (L) 09/11/2019   ALBUMIN 4.1 08/20/2016   CALCIUM 9.3 02/05/2020   Lab Results  Component Value Date   CHOL 116 09/11/2019   Lab Results  Component Value Date   HDL 44 09/11/2019   Lab Results  Component Value Date   LDLCALC 57 09/11/2019   Lab Results  Component Value Date   TRIG 71 09/11/2019   Lab Results  Component Value Date   CHOLHDL 2.6 09/11/2019   No results found for: HGBA1C    Assessment & Plan:   Problem List Items Addressed This Visit      Cardiovascular and Mediastinum   Purpura senilis (HCC)    Benign condition      Relevant Medications   hydrochlorothiazide (HYDRODIURIL) 25 MG tablet   HYPERTENSION, BENIGN - Primary    Well controlled. Change ARB to HCTZ bc of persistant cough. Follow up in  6 months.       Relevant Medications   hydrochlorothiazide (HYDRODIURIL) 25 MG tablet   Other Relevant Orders   COMPLETE METABOLIC PANEL WITH GFR     Other   Proteins serum plasma low    Due to recheck protein levels.       Relevant Orders   COMPLETE METABOLIC PANEL WITH GFR   Cough    Improved but not resolved on ARB. Will d/o losartan and switch to HCTZ. Call if BP not well controlled at home.  Continue PPI and consider return to ENT if not improving.        Other Visit Diagnoses    Abnormal glucose       Need for immunization against influenza       Relevant Orders   Flu Vaccine QUAD High Dose(Fluad)  (Completed)      Meds ordered this encounter  Medications  . hydrochlorothiazide (HYDRODIURIL) 25 MG tablet    Sig: Take 1 tablet (25 mg total) by mouth daily.    Dispense:  90 tablet    Refill:  0    Follow-up: Return in about 3 months (around 06/16/2020) for Hypertension.    Beatrice Lecher, MD

## 2020-03-18 LAB — COMPLETE METABOLIC PANEL WITH GFR
AG Ratio: 2 (calc) (ref 1.0–2.5)
ALT: 27 U/L (ref 9–46)
AST: 28 U/L (ref 10–35)
Albumin: 4.3 g/dL (ref 3.6–5.1)
Alkaline phosphatase (APISO): 71 U/L (ref 35–144)
BUN: 13 mg/dL (ref 7–25)
CO2: 25 mmol/L (ref 20–32)
Calcium: 9.3 mg/dL (ref 8.6–10.3)
Chloride: 107 mmol/L (ref 98–110)
Creat: 1.07 mg/dL (ref 0.70–1.25)
GFR, Est African American: 82 mL/min/{1.73_m2} (ref >=60)
GFR, Est Non African American: 71 mL/min/{1.73_m2} (ref >=60)
Globulin: 2.1 g/dL (calc) (ref 1.9–3.7)
Glucose, Bld: 90 mg/dL (ref 65–139)
Potassium: 4.6 mmol/L (ref 3.5–5.3)
Sodium: 139 mmol/L (ref 135–146)
Total Bilirubin: 0.6 mg/dL (ref 0.2–1.2)
Total Protein: 6.4 g/dL (ref 6.1–8.1)

## 2020-03-18 NOTE — Progress Notes (Signed)
All labs are normal. 

## 2020-03-24 DIAGNOSIS — M9901 Segmental and somatic dysfunction of cervical region: Secondary | ICD-10-CM | POA: Diagnosis not present

## 2020-03-24 DIAGNOSIS — M545 Low back pain: Secondary | ICD-10-CM | POA: Diagnosis not present

## 2020-03-24 DIAGNOSIS — M9903 Segmental and somatic dysfunction of lumbar region: Secondary | ICD-10-CM | POA: Diagnosis not present

## 2020-03-24 DIAGNOSIS — M542 Cervicalgia: Secondary | ICD-10-CM | POA: Diagnosis not present

## 2020-03-24 DIAGNOSIS — M25552 Pain in left hip: Secondary | ICD-10-CM | POA: Diagnosis not present

## 2020-03-24 DIAGNOSIS — M62830 Muscle spasm of back: Secondary | ICD-10-CM | POA: Diagnosis not present

## 2020-03-24 DIAGNOSIS — M25511 Pain in right shoulder: Secondary | ICD-10-CM | POA: Diagnosis not present

## 2020-04-03 DIAGNOSIS — H40013 Open angle with borderline findings, low risk, bilateral: Secondary | ICD-10-CM | POA: Diagnosis not present

## 2020-04-15 DIAGNOSIS — M542 Cervicalgia: Secondary | ICD-10-CM | POA: Diagnosis not present

## 2020-04-15 DIAGNOSIS — M62830 Muscle spasm of back: Secondary | ICD-10-CM | POA: Diagnosis not present

## 2020-04-15 DIAGNOSIS — M5417 Radiculopathy, lumbosacral region: Secondary | ICD-10-CM | POA: Diagnosis not present

## 2020-04-15 DIAGNOSIS — M25552 Pain in left hip: Secondary | ICD-10-CM | POA: Diagnosis not present

## 2020-04-15 DIAGNOSIS — M25511 Pain in right shoulder: Secondary | ICD-10-CM | POA: Diagnosis not present

## 2020-04-15 DIAGNOSIS — M9901 Segmental and somatic dysfunction of cervical region: Secondary | ICD-10-CM | POA: Diagnosis not present

## 2020-04-15 DIAGNOSIS — M9903 Segmental and somatic dysfunction of lumbar region: Secondary | ICD-10-CM | POA: Diagnosis not present

## 2020-04-18 ENCOUNTER — Other Ambulatory Visit: Payer: Self-pay

## 2020-04-18 ENCOUNTER — Ambulatory Visit (INDEPENDENT_AMBULATORY_CARE_PROVIDER_SITE_OTHER): Payer: Medicare Other

## 2020-04-18 ENCOUNTER — Ambulatory Visit (INDEPENDENT_AMBULATORY_CARE_PROVIDER_SITE_OTHER): Payer: Medicare Other | Admitting: Sports Medicine

## 2020-04-18 DIAGNOSIS — I2510 Atherosclerotic heart disease of native coronary artery without angina pectoris: Secondary | ICD-10-CM

## 2020-04-18 DIAGNOSIS — M19071 Primary osteoarthritis, right ankle and foot: Secondary | ICD-10-CM

## 2020-04-18 NOTE — Progress Notes (Signed)
    Procedures performed today:    Procedure: Real-time Ultrasound Guided injection of the right first MTP Device: Samsung HS60  Verbal informed consent obtained.  Time-out conducted.  Noted no overlying erythema, induration, or other signs of local infection.  Skin prepped in a sterile fashion.  Local anesthesia: Topical Ethyl chloride.  With sterile technique and under real time ultrasound guidance:  Noted arthritic MTP, 1/2 cc lidocaine, 1/2 cc kenalog 40 injected easily.   Completed without difficulty  Pain immediately resolved suggesting accurate placement of the medication.  Advised to call if fevers/chills, erythema, induration, drainage, or persistent bleeding.  Images permanently stored and available for review in PACS.  Impression: Technically successful ultrasound guided injection.  Independent interpretation of notes and tests performed by another provider:   None.  Brief History, Exam, Impression, and Recommendations:    Osteoarthritis of first metatarsophalangeal (MTP) joint of right foot Recurrence of right first MTP pain, repeat injection today, last injected in April of this year. Return as needed.     ___________________________________________ Gwen Her. Dianah Field, M.D., ABFM., CAQSM. Primary Care and [place] Instructor of [place] [facility] of [facility]

## 2020-04-18 NOTE — Assessment & Plan Note (Signed)
Recurrence of right first MTP pain, repeat injection today, last injected in April of this year. Return as needed.

## 2020-04-20 ENCOUNTER — Other Ambulatory Visit: Payer: Self-pay | Admitting: Family Medicine

## 2020-05-01 DIAGNOSIS — Z23 Encounter for immunization: Secondary | ICD-10-CM | POA: Diagnosis not present

## 2020-05-13 DIAGNOSIS — M25552 Pain in left hip: Secondary | ICD-10-CM | POA: Diagnosis not present

## 2020-05-13 DIAGNOSIS — M9901 Segmental and somatic dysfunction of cervical region: Secondary | ICD-10-CM | POA: Diagnosis not present

## 2020-05-13 DIAGNOSIS — M5417 Radiculopathy, lumbosacral region: Secondary | ICD-10-CM | POA: Diagnosis not present

## 2020-05-13 DIAGNOSIS — M25511 Pain in right shoulder: Secondary | ICD-10-CM | POA: Diagnosis not present

## 2020-05-13 DIAGNOSIS — M9903 Segmental and somatic dysfunction of lumbar region: Secondary | ICD-10-CM | POA: Diagnosis not present

## 2020-05-13 DIAGNOSIS — M542 Cervicalgia: Secondary | ICD-10-CM | POA: Diagnosis not present

## 2020-05-13 DIAGNOSIS — M62830 Muscle spasm of back: Secondary | ICD-10-CM | POA: Diagnosis not present

## 2020-05-19 ENCOUNTER — Ambulatory Visit (INDEPENDENT_AMBULATORY_CARE_PROVIDER_SITE_OTHER): Payer: Medicare Other | Admitting: Sports Medicine

## 2020-05-19 ENCOUNTER — Ambulatory Visit (INDEPENDENT_AMBULATORY_CARE_PROVIDER_SITE_OTHER): Payer: Medicare Other

## 2020-05-19 DIAGNOSIS — I2510 Atherosclerotic heart disease of native coronary artery without angina pectoris: Secondary | ICD-10-CM | POA: Diagnosis not present

## 2020-05-19 DIAGNOSIS — M7122 Synovial cyst of popliteal space [Baker], left knee: Secondary | ICD-10-CM

## 2020-05-19 NOTE — Progress Notes (Signed)
    Procedures performed today:    Procedure: Real-time Ultrasound Guided  aspiration/injection of left knee Baker's cyst Device: Samsung HS60  Verbal informed consent obtained.  Time-out conducted.  Noted no overlying erythema, induration, or other signs of local infection.  Skin prepped in a sterile fashion.  Local anesthesia: Topical Ethyl chloride.  With sterile technique and under real time ultrasound guidance:  Using 18-gauge needle aspirated approximately 1 mL of clear, straw-colored fluid, syringe switched and 1 cc kenalog 40 injected easily.   Completed without difficulty  Advised to call if fevers/chills, erythema, induration, drainage, or persistent bleeding.  Images permanently stored and available for review in PACS.  Impression: Technically successful ultrasound guided injection.  Independent interpretation of notes and tests performed by another provider:   None.  Brief History, Exam, Impression, and Recommendations:    Baker's cyst, left Recurrence of a Baker's cyst, previously aspirated and injected back in July 2018, return as needed. Due to known osteoarthritis if this does not resolve his symptoms we will try an injection into the joint next.    ___________________________________________ Gwen Her. Dianah Field, M.D., ABFM., CAQSM. Primary Care and [place] Instructor of [place] of [facility] of Medicine

## 2020-05-19 NOTE — Assessment & Plan Note (Addendum)
Recurrence of a Baker's cyst, previously aspirated and injected back in July 2018, return as needed. Due to known osteoarthritis if this does not resolve his symptoms we will try an injection into the joint next.

## 2020-06-02 ENCOUNTER — Ambulatory Visit (INDEPENDENT_AMBULATORY_CARE_PROVIDER_SITE_OTHER): Payer: Medicare Other | Admitting: Family Medicine

## 2020-06-02 ENCOUNTER — Encounter: Payer: Self-pay | Admitting: Family Medicine

## 2020-06-02 ENCOUNTER — Ambulatory Visit (INDEPENDENT_AMBULATORY_CARE_PROVIDER_SITE_OTHER): Payer: Medicare Other

## 2020-06-02 ENCOUNTER — Other Ambulatory Visit: Payer: Self-pay

## 2020-06-02 VITALS — BP 146/76 | HR 66 | Ht 70.0 in | Wt 224.0 lb

## 2020-06-02 DIAGNOSIS — I2510 Atherosclerotic heart disease of native coronary artery without angina pectoris: Secondary | ICD-10-CM | POA: Diagnosis not present

## 2020-06-02 DIAGNOSIS — R531 Weakness: Secondary | ICD-10-CM | POA: Diagnosis not present

## 2020-06-02 DIAGNOSIS — R030 Elevated blood-pressure reading, without diagnosis of hypertension: Secondary | ICD-10-CM

## 2020-06-02 DIAGNOSIS — R29898 Other symptoms and signs involving the musculoskeletal system: Secondary | ICD-10-CM

## 2020-06-02 DIAGNOSIS — I10 Essential (primary) hypertension: Secondary | ICD-10-CM

## 2020-06-02 DIAGNOSIS — I6782 Cerebral ischemia: Secondary | ICD-10-CM | POA: Diagnosis not present

## 2020-06-02 DIAGNOSIS — J3489 Other specified disorders of nose and nasal sinuses: Secondary | ICD-10-CM | POA: Diagnosis not present

## 2020-06-02 MED ORDER — GADOBUTROL 1 MMOL/ML IV SOLN
10.0000 mL | Freq: Once | INTRAVENOUS | Status: AC | PRN
Start: 2020-06-02 — End: 2020-06-02
  Administered 2020-06-02: 10 mL via INTRAVENOUS

## 2020-06-02 MED ORDER — LOSARTAN POTASSIUM 100 MG PO TABS: 100.0000 mg | ORAL_TABLET | Freq: Every day | ORAL | 1 refills | Status: DC

## 2020-06-02 NOTE — Assessment & Plan Note (Signed)
BP not well controlled. Restarted losartan 50 mg 5 days ago.  Will increase to 100 mg.  Follow BP for next 4-5 days.
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
## 2020-06-02 NOTE — Patient Instructions (Addendum)
Continue to take your daily aspirin and cholesterol pill. Make sure eating a low-salt DASH diet. Work on losing weight.

## 2020-06-08 ENCOUNTER — Other Ambulatory Visit: Payer: Self-pay | Admitting: Family Medicine

## 2020-06-08 DIAGNOSIS — I10 Essential (primary) hypertension: Secondary | ICD-10-CM

## 2020-06-10 DIAGNOSIS — M9901 Segmental and somatic dysfunction of cervical region: Secondary | ICD-10-CM | POA: Diagnosis not present

## 2020-06-10 DIAGNOSIS — M25552 Pain in left hip: Secondary | ICD-10-CM | POA: Diagnosis not present

## 2020-06-10 DIAGNOSIS — M542 Cervicalgia: Secondary | ICD-10-CM | POA: Diagnosis not present

## 2020-06-10 DIAGNOSIS — M5417 Radiculopathy, lumbosacral region: Secondary | ICD-10-CM | POA: Diagnosis not present

## 2020-06-10 DIAGNOSIS — M62830 Muscle spasm of back: Secondary | ICD-10-CM | POA: Diagnosis not present

## 2020-06-10 DIAGNOSIS — M9903 Segmental and somatic dysfunction of lumbar region: Secondary | ICD-10-CM | POA: Diagnosis not present

## 2020-06-10 DIAGNOSIS — M25511 Pain in right shoulder: Secondary | ICD-10-CM | POA: Diagnosis not present

## 2020-06-16 ENCOUNTER — Ambulatory Visit: Payer: Medicare Other | Admitting: Family Medicine

## 2020-06-17 ENCOUNTER — Other Ambulatory Visit: Payer: Self-pay

## 2020-06-17 ENCOUNTER — Encounter: Payer: Self-pay | Admitting: Family Medicine

## 2020-06-17 ENCOUNTER — Ambulatory Visit (INDEPENDENT_AMBULATORY_CARE_PROVIDER_SITE_OTHER): Payer: Medicare Other | Admitting: Family Medicine

## 2020-06-17 VITALS — BP 113/75 | HR 59 | Ht 70.0 in | Wt 219.0 lb

## 2020-06-17 DIAGNOSIS — R29898 Other symptoms and signs involving the musculoskeletal system: Secondary | ICD-10-CM

## 2020-06-17 DIAGNOSIS — I2510 Atherosclerotic heart disease of native coronary artery without angina pectoris: Secondary | ICD-10-CM | POA: Diagnosis not present

## 2020-06-17 DIAGNOSIS — I10 Essential (primary) hypertension: Secondary | ICD-10-CM

## 2020-06-17 DIAGNOSIS — M19071 Primary osteoarthritis, right ankle and foot: Secondary | ICD-10-CM

## 2020-06-17 NOTE — Progress Notes (Signed)
Established Patient Office Visit  Subjective:  Patient ID: Joshua Warner, male    DOB: Nov 13, 1950  Age: 69 y.o. MRN: [id number]  CC:  Chief Complaint  Patient presents with  . Hypertension    HPI Tab Rylee presents for follow-up hypertension.  When he was here couple weeks ago his blood pressure was elevated and we increase his losartan to 100 mg daily.  He is also on metoprolol.  He says he tolerated medication well without any side effects or problems.  He has been try to work on weight loss in fact he is lost about 7 pounds since October he is just trying to cut back on portions.  He had also had sudden onset of weakness in his left arm.  Did do a brain MRI just to rule out recent infarct or mass etc. and it was normal just showed some minor chronic microvascular ischemic changes.  Past Medical History:  Diagnosis Date  . Arthritis   . CHF (congestive heart failure) (HCC)    stents  . Coronary artery disease    stents  . GERD (gastroesophageal reflux disease)   . Hyperlipidemia   . Hypertension since 2007  . Kidney stones 2009  . Myocardial infarction ([place]) 12/2015  . Oxygen deficiency    only during heartattack    Past Surgical History:  Procedure Laterality Date  . CORONARY ANGIOPLASTY WITH STENT PLACEMENT  12/2015  . KNEE ARTHROSCOPY  12/31/2010   Right knee   . TONSILLECTOMY  1957    Family History  Problem Relation Age of Onset  . Heart disease Father   . Hypertension Mother   . Prostate cancer Maternal Uncle        Deceased  . Depression Maternal Uncle   . Cancer Paternal Grandfather   . Cancer Paternal Grandmother   . Colon cancer Neg Hx   . Stomach cancer Neg Hx   . Colon polyps Neg Hx   . Esophageal cancer Neg Hx     Social History   Socioeconomic History  . Marital status: Married    Spouse name: [name]  . Number of children: 2  . Years of education: 53  . Highest education level: Some college, no degree  Occupational History  .  Occupation: Retired    Fish farm manager: [employer]  Tobacco Use  . Smoking status: Never Smoker  . Smokeless tobacco: Never Used  Vaping Use  . Vaping Use: Never used  Substance and Sexual Activity  . Alcohol use: Yes    Alcohol/week: 1.0 standard drink    Types: 1 Glasses of wine per week    Comment: occasionally wine  . Drug use: Never  . 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  Other Topics Concern  . Not on file  Social History Narrative   No regular exercise. Retired from [employer] Strain: Not on [employer] Insecurity: Not on file  Transportation Needs: Not on file  Physical Activity: Not on file  Stress: Not on file  Social Connections: Not on file  Intimate Partner Violence: Not on file    Outpatient Medications Prior to Visit  Medication Sig Dispense Refill  . ASPIRIN ADULT PO Take 81 mg by mouth.     Marland Kitchen atorvastatin (LIPITOR) 80 MG tablet Take  80 mg by mouth at bedtime.    Marland Kitchen b complex vitamins tablet Take 1 tablet by mouth daily.    . Cholecalciferol 125 MCG (5000 UT) capsule Take 5,000 Units by mouth daily.    . Glucosamine-Chondroit-Vit C-Mn (GLUCOSAMINE CHONDR 1500 COMPLX) CAPS Take by mouth daily.    Marland Kitchen loratadine (CLARITIN) 10 MG tablet Take 10 mg by mouth daily.    Marland Kitchen losartan (COZAAR) 100 MG tablet Take 1 tablet (100 mg total) by mouth daily. 90 tablet 1  . metoprolol tartrate (LOPRESSOR) 25 MG tablet Take 12.5 mg by mouth 2 (two) times daily.    . Multiple Vitamin (MULTIVITAMIN) tablet Take 1 tablet by mouth daily.    . nitroGLYCERIN (NITROSTAT) 0.4 MG SL tablet PLACE 1 TABLET (0.4 MG TOTAL) UNDER THE TONGUE EVERY 5 (FIVE) MINUTES AS NEEDED. 25 tablet 1  . pantoprazole (PROTONIX) 40 MG tablet Take 1 tablet (40 mg total) by mouth daily. 30 tablet 11  .  vitamin C (ASCORBIC ACID) 500 MG tablet Take 1,000 mg by mouth daily.     No facility-administered medications prior to visit.    Allergies  Allergen Reactions  . Celebrex [Celecoxib]     REACTION: rash REACTION: rash  . Sulfa Antibiotics     Other reaction(s): Unknown  . Sulfonamide Derivatives     REACTION: hives  . Lisinopril Other (See Comments)    Cough    ROS Review of Systems    Objective:    Physical Exam Constitutional:      Appearance: He is well-developed and well-nourished.  HENT:     Head: Normocephalic and atraumatic.  Cardiovascular:     Rate and Rhythm: Normal rate and regular rhythm.     Heart sounds: Normal heart sounds.  Pulmonary:     Effort: Pulmonary effort is normal.     Breath sounds: Normal breath sounds.  Skin:    General: Skin is warm and dry.  Neurological:     Mental Status: He is alert and oriented to person, place, and time.  Psychiatric:        Mood and Affect: Mood and affect normal.        Behavior: Behavior normal.     BP 113/75   Pulse (!) 59   Ht 5\' 10"  (1.778 m)   Wt 219 lb (99.3 kg)   SpO2 97%   BMI 31.42 kg/m  Wt Readings from Last 3 Encounters:  06/17/20 219 lb (99.3 kg)  06/02/20 224 lb (101.6 kg)  03/17/20 227 lb (103 kg)     There are no preventive care reminders to display for this patient.  There are no preventive care reminders to display for this patient.  Lab Results  Component Value Date   TSH 1.05 09/05/2018   Lab Results  Component Value Date   WBC 6.6 11/27/2019   HGB 17.6 (H) 11/27/2019   HCT 51.4 11/27/2019   MCV 91.9 11/27/2019   PLT 200.0 11/27/2019   Lab Results  Component Value Date   NA 139 03/17/2020   K 4.6 03/17/2020   CO2 25 03/17/2020   GLUCOSE 90 03/17/2020   BUN 13 03/17/2020   CREATININE 1.07 03/17/2020   BILITOT 0.6 03/17/2020   ALKPHOS 61 08/20/2016   AST 28 03/17/2020   ALT 27 03/17/2020   PROT 6.4 03/17/2020   ALBUMIN 4.1 08/20/2016   CALCIUM 9.3 03/17/2020    Lab Results  Component Value Date   CHOL 116 09/11/2019   Lab Results  Component Value Date   HDL 44 09/11/2019   Lab Results  Component Value Date   LDLCALC 57 09/11/2019   Lab Results  Component Value Date   TRIG 71 09/11/2019   Lab Results  Component Value Date   CHOLHDL 2.6 09/11/2019   No results found for: HGBA1C    Assessment & Plan:   Problem List Items Addressed This Visit      Cardiovascular and Mediastinum   HYPERTENSION, BENIGN - Primary    Blood pressure significantly improved today he says even getting some blood pressures in the 120s at home which is great he is also done a great job on weight loss.  We will continue current regimen and I will see him back in 3 months.       Other Visit Diagnoses    Left arm weakness         Not had any recurrence of left arm weakness.  Did encourage him to call if it happens again and will refer to either sports med Ortho at that point.  Brain MRI was normal.  No orders of the defined types were placed in this encounter.   Follow-up: Return in about 3 months (around 09/15/2020) for Hypertension and labs. .  I spent 20 minutes on the day of the encounter to include pre-visit record review, face-to-face time with the patient and post visit ordering of test.   Beatrice Lecher, MD

## 2020-06-17 NOTE — Assessment & Plan Note (Signed)
Blood pressure significantly improved today he says even getting some blood pressures in the 120s at home which is great he is also done a great job on weight loss.  We will continue current regimen and I will see him back in 3 months.

## 2020-06-28 DIAGNOSIS — N200 Calculus of kidney: Secondary | ICD-10-CM

## 2020-06-28 HISTORY — DX: Calculus of kidney: N20.0

## 2020-07-06 IMAGING — RF DG ESOPHAGUS
11 of 18 series · 13 of 24 positions shown · non-contrast
Comparison: None.

CLINICAL DATA: Esophageal dysphagia, gastroesophageal reflux
disease, Schatzki's ring, cough.

EXAM:
ESOPHOGRAM / BARIUM SWALLOW / BARIUM TABLET STUDY
TECHNIQUE: Combined double contrast and single contrast examination performed
using effervescent crystals, thick barium liquid, and thin barium
liquid. The patient was observed with fluoroscopy swallowing a 13 mm
barium sulphate tablet.
FLUOROSCOPY TIME:  Fluoroscopy Time:  1 minutes 24 seconds
Radiation Exposure Index (if provided by the fluoroscopic device):
Number of Acquired Spot Images: 0

[Series 1: cp_standard · 0.34mm/px · 2 of 34 frames shown (1 of 11)]
[frame 6/34]
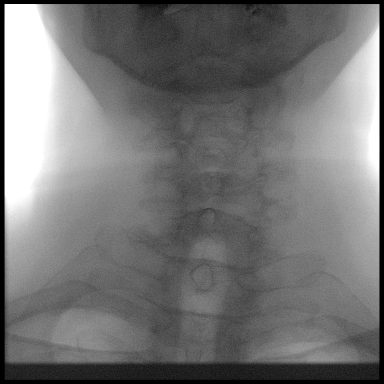
[frame 18/34]
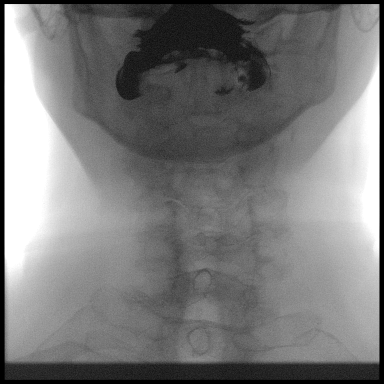

[Series 2: cp_standard · 0.17mm/px · 1 of 1 slices shown (2 of 11)]
[im 1/1]
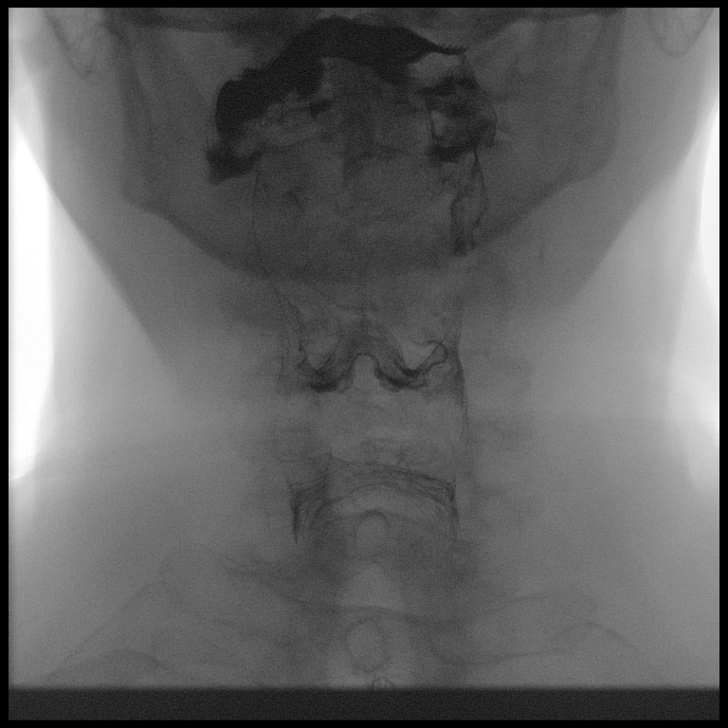

[Series 3: cp_standard · 0.34mm/px · 2 of 38 frames shown (3 of 11)]
[frame 6/38]
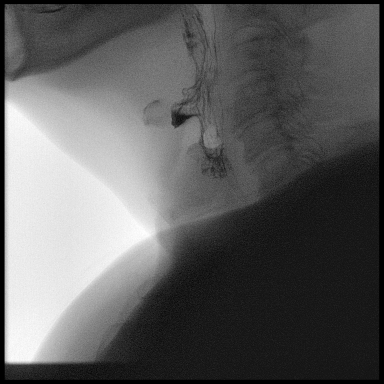
[frame 33/38]
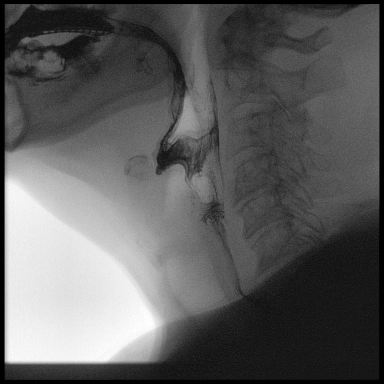

[Series 5: cp_standard · 0.25mm/px · 1 of 1 slices shown (4 of 11)]
[im 1/1]
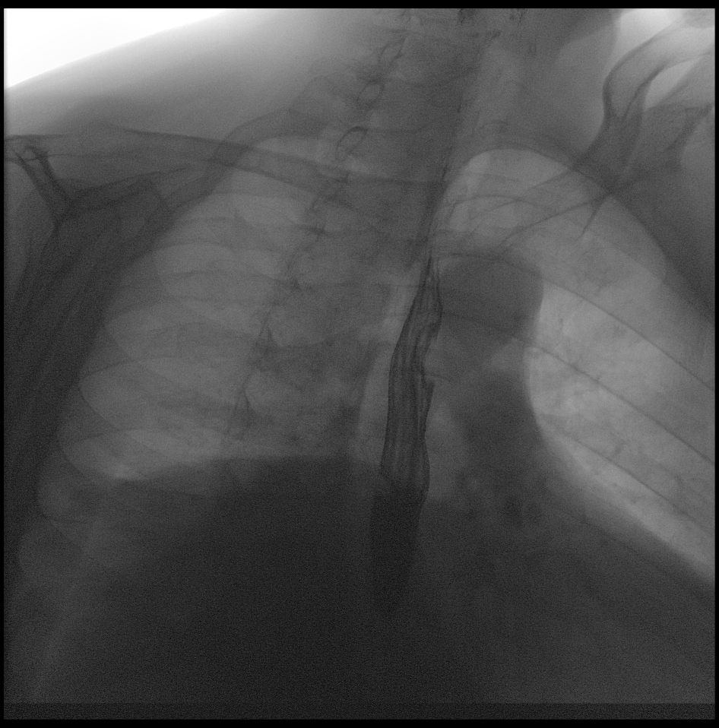

[Series 8: cp_standard · 0.25mm/px · 1 of 1 slices shown (5 of 11)]
[im 1/1]
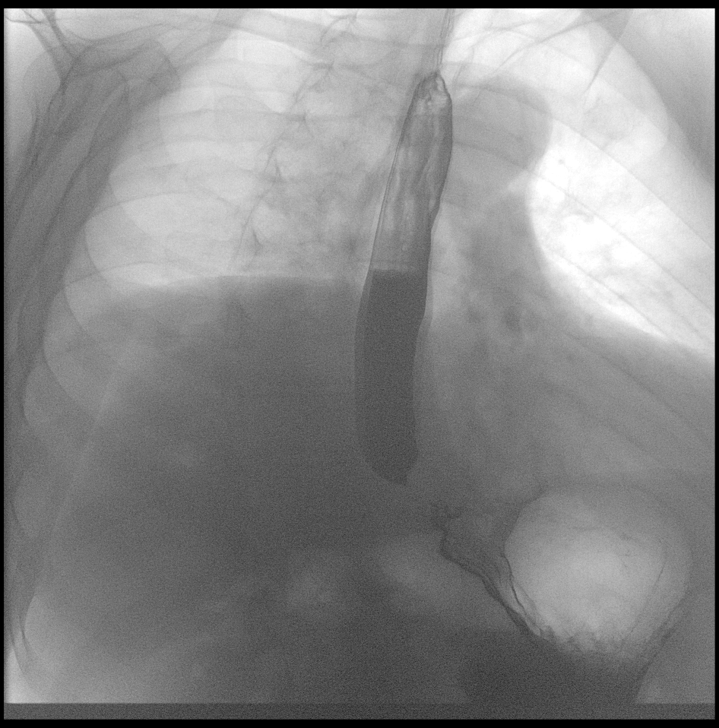

[Series 9: cp_standard · 0.25mm/px · 1 of 1 slices shown (6 of 11)]
[im 1/1]
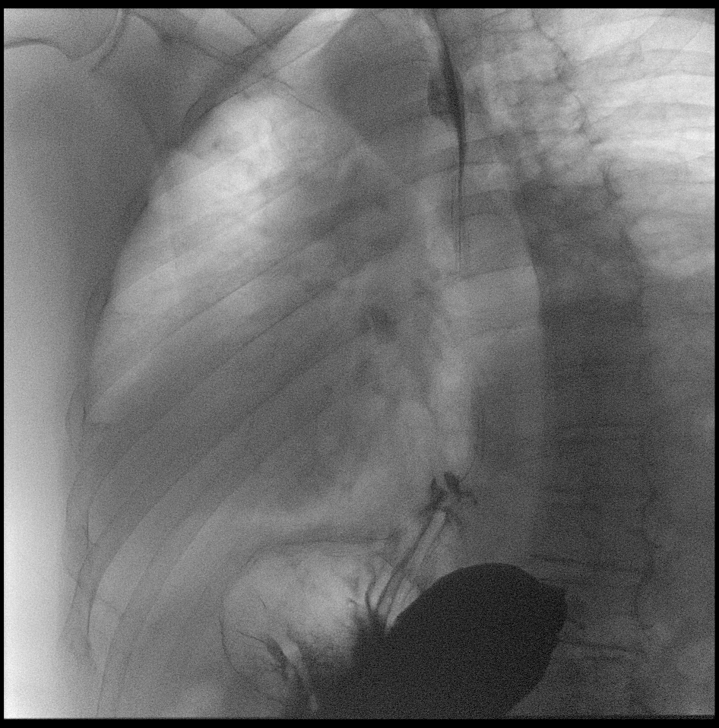

[Series 11: cp_standard · 0.26mm/px · 1 of 1 slices shown (7 of 11)]
[im 1/1]
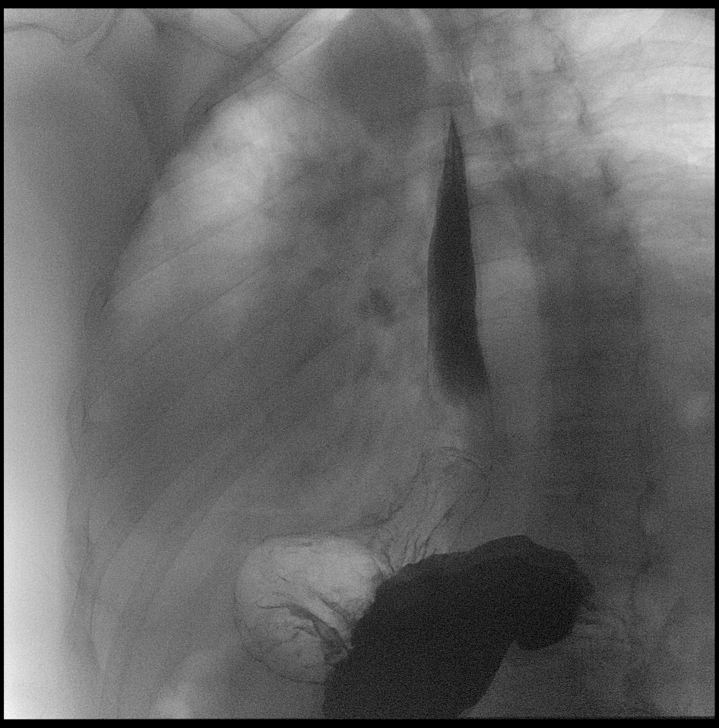

[Series 13: cp_standard · 0.26mm/px · 1 of 1 slices shown (8 of 11)]
[im 1/1]
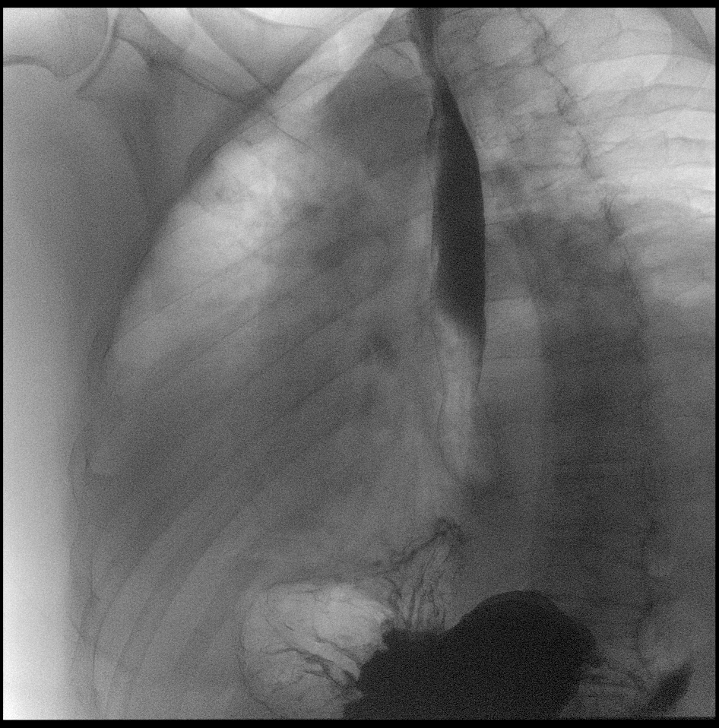

[Series 15: cp_standard · 0.26mm/px · 1 of 1 slices shown (9 of 11)]
[im 1/1]
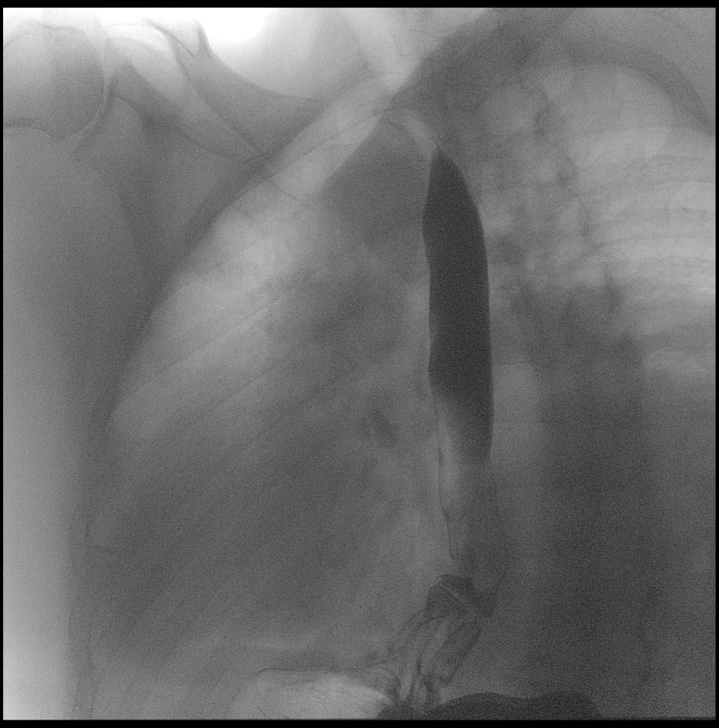

[Series 17: cp_standard · 0.26mm/px · 1 of 1 slices shown (10 of 11)]
[im 1/1]
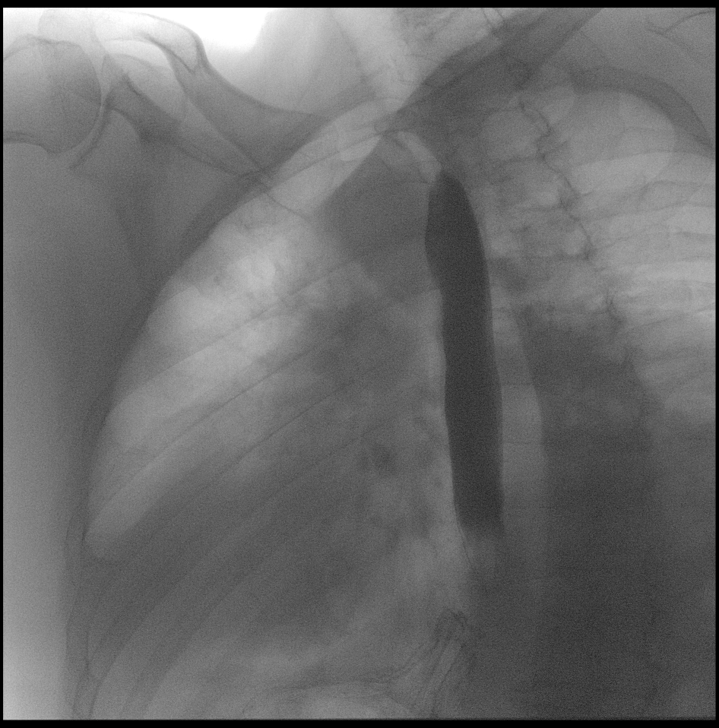

[Series 19: cp_standard · 0.26mm/px · 1 of 1 slices shown (11 of 11)]
[im 1/1]
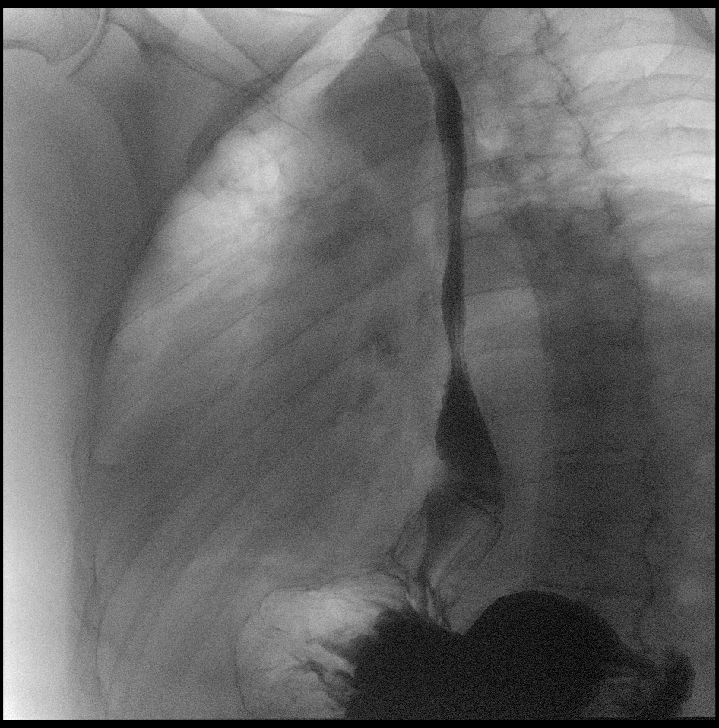

[13 of 24 positions shown; findings below may reference images not displayed]

FINDINGS: Swallowing mechanism is normal. Normal esophageal motility. No
esophageal fold thickening, stricture or obstruction. Small hiatal
hernia. A 13 mm barium tablet passed readily into the stomach.
IMPRESSION: Small hiatal hernia. No additional findings to explain the patient's
cough.

## 2020-07-11 DIAGNOSIS — M79672 Pain in left foot: Secondary | ICD-10-CM | POA: Diagnosis not present

## 2020-07-11 DIAGNOSIS — M21611 Bunion of right foot: Secondary | ICD-10-CM | POA: Diagnosis not present

## 2020-07-11 DIAGNOSIS — M79671 Pain in right foot: Secondary | ICD-10-CM | POA: Diagnosis not present

## 2020-07-11 DIAGNOSIS — M21612 Bunion of left foot: Secondary | ICD-10-CM | POA: Diagnosis not present

## 2020-08-07 ENCOUNTER — Ambulatory Visit: Payer: Medicare Other | Admitting: Sports Medicine

## 2020-08-12 DIAGNOSIS — M2021 Hallux rigidus, right foot: Secondary | ICD-10-CM | POA: Diagnosis not present

## 2020-08-12 DIAGNOSIS — M25374 Other instability, right foot: Secondary | ICD-10-CM | POA: Diagnosis not present

## 2020-08-12 DIAGNOSIS — G8918 Other acute postprocedural pain: Secondary | ICD-10-CM | POA: Diagnosis not present

## 2020-08-12 DIAGNOSIS — M21611 Bunion of right foot: Secondary | ICD-10-CM | POA: Diagnosis not present

## 2020-08-12 DIAGNOSIS — M2041 Other hammer toe(s) (acquired), right foot: Secondary | ICD-10-CM | POA: Diagnosis not present

## 2020-08-12 DIAGNOSIS — M2011 Hallux valgus (acquired), right foot: Secondary | ICD-10-CM | POA: Diagnosis not present

## 2020-08-12 DIAGNOSIS — M7741 Metatarsalgia, right foot: Secondary | ICD-10-CM | POA: Diagnosis not present

## 2020-09-01 ENCOUNTER — Other Ambulatory Visit: Payer: Self-pay | Admitting: Gastroenterology

## 2020-09-15 ENCOUNTER — Encounter: Payer: Self-pay | Admitting: Family Medicine

## 2020-09-15 ENCOUNTER — Ambulatory Visit (INDEPENDENT_AMBULATORY_CARE_PROVIDER_SITE_OTHER): Payer: Medicare Other | Admitting: Family Medicine

## 2020-09-15 ENCOUNTER — Other Ambulatory Visit: Payer: Self-pay

## 2020-09-15 VITALS — BP 128/64 | HR 66 | Ht 70.0 in | Wt 229.0 lb

## 2020-09-15 DIAGNOSIS — I10 Essential (primary) hypertension: Secondary | ICD-10-CM | POA: Diagnosis not present

## 2020-09-15 DIAGNOSIS — E785 Hyperlipidemia, unspecified: Secondary | ICD-10-CM | POA: Diagnosis not present

## 2020-09-15 DIAGNOSIS — Z832 Family history of diseases of the blood and blood-forming organs and certain disorders involving the immune mechanism: Secondary | ICD-10-CM | POA: Diagnosis not present

## 2020-09-15 DIAGNOSIS — Z125 Encounter for screening for malignant neoplasm of prostate: Secondary | ICD-10-CM

## 2020-09-15 DIAGNOSIS — I2510 Atherosclerotic heart disease of native coronary artery without angina pectoris: Secondary | ICD-10-CM

## 2020-09-15 NOTE — Progress Notes (Signed)
Established Patient Office Visit  Subjective:  Patient ID: Joshua Warner, male    DOB: [birth year]  Age: 70 y.o. MRN: [id number]  CC:  Chief Complaint  Patient presents with  . Hypertension    HPI Joshua Warner presents for   Hypertension- Pt denies chest pain, SOB, dizziness, or heart palpitations.  Taking meds as directed w/o problems.  Denies medication side effects.    Hyperlipidemia - tolerating stating well with no myalgias or significant side effects.  Lab Results  Component Value Date   CHOL 116 09/11/2019   HDL 44 09/11/2019   LDLCALC 57 09/11/2019   TRIG 71 09/11/2019   CHOLHDL 2.6 09/11/2019    He also recently found out that he has a family history of factor V Leiden deficiency and would like to be tested.  He has had several family members on his father side diagnosed with this though his father was not diagnosed with factor V Leiden deficiency.  He did have foot surgery since I last saw him and is still recovering he is in a postoperative boot and still has some pins in the second and third toes which he should have removed hopefully in the next week or 2.  This is caused him to be a lot more sedentary.  Past Medical History:  Diagnosis Date  . Arthritis   . CHF (congestive heart failure) (HCC)    stents  . Coronary artery disease    stents  . GERD (gastroesophageal reflux disease)   . Hyperlipidemia   . Hypertension since 2007  . Kidney stones 2009  . Myocardial infarction ([place]) 12/2015  . Oxygen deficiency    only during heartattack    Past Surgical History:  Procedure Laterality Date  . CORONARY ANGIOPLASTY WITH STENT PLACEMENT  12/2015  . KNEE ARTHROSCOPY  12/31/2010   Right knee   . TONSILLECTOMY  1957    Family History  Problem Relation Age of Onset  . Heart disease Father   . Hypertension Mother   . Prostate cancer Maternal Uncle        Deceased  . Depression Maternal Uncle   . Cancer Paternal Grandfather   . Cancer Paternal  Grandmother   . Colon cancer Neg Hx   . Stomach cancer Neg Hx   . Colon polyps Neg Hx   . Esophageal cancer Neg Hx     Social History   Socioeconomic History  . Marital status: Married    Spouse name: [name]  . Number of children: 2  . Years of education: 36  . Highest education level: Some college, no degree  Occupational History  . Occupation: Retired    Fish farm manager: [employer]  Tobacco Use  . Smoking status: Never Smoker  . Smokeless tobacco: Never Used  Vaping Use  . Vaping Use: Never used  Substance and Sexual Activity  . Alcohol use: Yes    Alcohol/week: 1.0 standard drink    Types: 1 Glasses of wine per week    Comment: occasionally wine  . Drug use: Never  . 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  Other Topics Concern  . Not on file  Social History Narrative   No regular exercise. Retired from [employer] Strain: Not on [employer] Insecurity: Not on file  Transportation Needs: Not on file  Physical Activity: Not on file  Stress: Not on file  Social Connections: Not on file  Intimate Partner Violence: Not on file    Outpatient Medications Prior to Visit  Medication Sig Dispense Refill  . ASPIRIN ADULT PO Take 81 mg by mouth.     Marland Kitchen atorvastatin (LIPITOR) 80 MG tablet Take 80 mg by mouth at bedtime.    Marland Kitchen b complex vitamins tablet Take 1 tablet by mouth daily.    . Cholecalciferol 125 MCG (5000 UT) capsule Take 5,000 Units by mouth daily.    . Glucosamine-Chondroit-Vit C-Mn (GLUCOSAMINE CHONDR 1500 COMPLX) CAPS Take by mouth daily.    Marland Kitchen loratadine (CLARITIN) 10 MG tablet Take 10 mg by mouth daily.    Marland Kitchen losartan (COZAAR) 100 MG tablet Take 1 tablet (100 mg total) by mouth daily. 90 tablet 1  . metoprolol tartrate (LOPRESSOR) 25 MG tablet Take 12.5 mg by  mouth 2 (two) times daily.    . Multiple Vitamin (MULTIVITAMIN) tablet Take 1 tablet by mouth daily.    . nitroGLYCERIN (NITROSTAT) 0.4 MG SL tablet PLACE 1 TABLET (0.4 MG TOTAL) UNDER THE TONGUE EVERY 5 (FIVE) MINUTES AS NEEDED. 25 tablet 1  . pantoprazole (PROTONIX) 40 MG tablet TAKE 1 TABLET BY MOUTH EVERY DAY 90 tablet 3  . vitamin C (ASCORBIC ACID) 500 MG tablet Take 1,000 mg by mouth daily.     No facility-administered medications prior to visit.    Allergies  Allergen Reactions  . Celebrex [Celecoxib]     REACTION: rash REACTION: rash  . Sulfa Antibiotics     Other reaction(s): Unknown  . Sulfonamide Derivatives     REACTION: hives  . Lisinopril Other (See Comments)    Cough    ROS Review of Systems    Objective:    Physical Exam Constitutional:      Appearance: He is well-developed.  HENT:     Head: Normocephalic and atraumatic.  Cardiovascular:     Rate and Rhythm: Normal rate and regular rhythm.     Heart sounds: Normal heart sounds.  Pulmonary:     Effort: Pulmonary effort is normal.     Breath sounds: Normal breath sounds.  Skin:    General: Skin is warm and dry.  Neurological:     Mental Status: He is alert and oriented to person, place, and time.  Psychiatric:        Behavior: Behavior normal.     BP 128/64   Pulse 66   Ht 5\' 10"  (1.778 m)   Wt 229 lb (103.9 kg)   SpO2 95%   BMI 32.86 kg/m  Wt Readings from Last 3 Encounters:  09/15/20 229 lb (103.9 kg)  06/17/20 219 lb (99.3 kg)  06/02/20 224 lb (101.6 kg)     There are no preventive care reminders to display for this patient.  There are no preventive care reminders to display for this patient.  Lab Results  Component Value Date   TSH 1.05 09/05/2018   Lab Results  Component Value Date   WBC 6.6 11/27/2019   HGB 17.6 (H) 11/27/2019   HCT 51.4 11/27/2019   MCV 91.9 11/27/2019   PLT 200.0 11/27/2019   Lab Results  Component Value Date   NA 139 03/17/2020   K 4.6 03/17/2020    CO2 25 03/17/2020   GLUCOSE 90 03/17/2020   BUN 13 03/17/2020   CREATININE 1.07 03/17/2020   BILITOT 0.6 03/17/2020   ALKPHOS  61 08/20/2016   AST 28 03/17/2020   ALT 27 03/17/2020   PROT 6.4 03/17/2020   ALBUMIN 4.1 08/20/2016   CALCIUM 9.3 03/17/2020   Lab Results  Component Value Date   CHOL 116 09/11/2019   Lab Results  Component Value Date   HDL 44 09/11/2019   Lab Results  Component Value Date   LDLCALC 57 09/11/2019   Lab Results  Component Value Date   TRIG 71 09/11/2019   Lab Results  Component Value Date   CHOLHDL 2.6 09/11/2019   No results found for: HGBA1C    Assessment & Plan:   Problem List Items Addressed This Visit      Cardiovascular and Mediastinum   HYPERTENSION, BENIGN - Primary    Well controlled. Continue current regimen. Follow up in  6 mo       Relevant Orders   PSA   Lipid Panel w/reflex Direct LDL   COMPLETE METABOLIC PANEL WITH GFR   Coronary artery disease involving native coronary artery of native heart without angina pectoris    Stable. No recent sxsx.         Hematopoietic and Hemostatic   Family history of factor V deficiency    Check his blood work per request to screen for this condition.       Relevant Orders   Factor 5 leiden     Other   Hyperlipidemia    Due to recheck lipids.  Tolerating statin well.      Relevant Orders   PSA   Lipid Panel w/reflex Direct LDL   COMPLETE METABOLIC PANEL WITH GFR    Other Visit Diagnoses    Screening for prostate cancer       Relevant Orders   PSA       No orders of the defined types were placed in this encounter.   Follow-up: Return in about 6 months (around 03/18/2021) for Hypertension.    Beatrice Lecher, MD

## 2020-09-15 NOTE — Assessment & Plan Note (Signed)
Due to recheck lipids.  Tolerating statin well.

## 2020-09-15 NOTE — Assessment & Plan Note (Signed)
Well controlled. Continue current regimen. Follow up in  6 mo  

## 2020-09-15 NOTE — Assessment & Plan Note (Signed)
Stable. No recent sxsx.

## 2020-09-15 NOTE — Assessment & Plan Note (Signed)
Check his blood work per request to screen for this condition.

## 2020-09-24 ENCOUNTER — Other Ambulatory Visit: Payer: Self-pay | Admitting: Orthopedic Surgery

## 2020-09-24 DIAGNOSIS — M21611 Bunion of right foot: Secondary | ICD-10-CM | POA: Diagnosis not present

## 2020-09-24 DIAGNOSIS — M21612 Bunion of left foot: Secondary | ICD-10-CM | POA: Diagnosis not present

## 2020-09-24 DIAGNOSIS — Z4789 Encounter for other orthopedic aftercare: Secondary | ICD-10-CM | POA: Diagnosis not present

## 2020-09-25 ENCOUNTER — Ambulatory Visit: Payer: Medicare Other | Admitting: Rehabilitative and Restorative Service Providers"

## 2020-09-26 DIAGNOSIS — Z125 Encounter for screening for malignant neoplasm of prostate: Secondary | ICD-10-CM | POA: Diagnosis not present

## 2020-09-26 DIAGNOSIS — E785 Hyperlipidemia, unspecified: Secondary | ICD-10-CM | POA: Diagnosis not present

## 2020-09-26 DIAGNOSIS — I10 Essential (primary) hypertension: Secondary | ICD-10-CM | POA: Diagnosis not present

## 2020-09-26 DIAGNOSIS — Z832 Family history of diseases of the blood and blood-forming organs and certain disorders involving the immune mechanism: Secondary | ICD-10-CM | POA: Diagnosis not present

## 2020-09-27 LAB — LIPID PANEL W/REFLEX DIRECT LDL
Cholesterol: 116 mg/dL (ref <200)
HDL: 44 mg/dL (ref >=40)
LDL Cholesterol (Calc): 57 mg/dL (calc)
Non-HDL Cholesterol (Calc): 72 mg/dL (calc) (ref <130)
Total CHOL/HDL Ratio: 2.6 (calc) (ref <5.0)
Triglycerides: 70 mg/dL (ref <150)

## 2020-09-27 LAB — PSA: PSA: 0.73 ng/mL (ref <=4.0)

## 2020-09-27 LAB — COMPLETE METABOLIC PANEL WITH GFR
AG Ratio: 2 (calc) (ref 1.0–2.5)
ALT: 24 U/L (ref 9–46)
AST: 30 U/L (ref 10–35)
Albumin: 4.3 g/dL (ref 3.6–5.1)
Alkaline phosphatase (APISO): 91 U/L (ref 35–144)
BUN: 19 mg/dL (ref 7–25)
CO2: 24 mmol/L (ref 20–32)
Calcium: 9.3 mg/dL (ref 8.6–10.3)
Chloride: 104 mmol/L (ref 98–110)
Creat: 1.08 mg/dL (ref 0.70–1.25)
GFR, Est African American: 81 mL/min/{1.73_m2} (ref >=60)
GFR, Est Non African American: 70 mL/min/{1.73_m2} (ref >=60)
Globulin: 2.1 g/dL (calc) (ref 1.9–3.7)
Glucose, Bld: 91 mg/dL (ref 65–99)
Potassium: 4.7 mmol/L (ref 3.5–5.3)
Sodium: 138 mmol/L (ref 135–146)
Total Bilirubin: 0.7 mg/dL (ref 0.2–1.2)
Total Protein: 6.4 g/dL (ref 6.1–8.1)

## 2020-10-03 LAB — FACTOR 5 LEIDEN: Result: NEGATIVE

## 2020-10-13 ENCOUNTER — Telehealth: Payer: Self-pay

## 2020-10-13 DIAGNOSIS — M19071 Primary osteoarthritis, right ankle and foot: Secondary | ICD-10-CM

## 2020-10-13 NOTE — Telephone Encounter (Signed)
Patient left a VM requesting to know where to go to get new orthotics made.

## 2020-10-13 NOTE — Telephone Encounter (Signed)
Dr. Clearance Coots at [facility] sports medicine, I am going to place a referral.

## 2020-10-20 DIAGNOSIS — M7989 Other specified soft tissue disorders: Secondary | ICD-10-CM | POA: Diagnosis not present

## 2020-10-20 DIAGNOSIS — Z4789 Encounter for other orthopedic aftercare: Secondary | ICD-10-CM | POA: Diagnosis not present

## 2020-10-20 DIAGNOSIS — Z9889 Other specified postprocedural states: Secondary | ICD-10-CM | POA: Diagnosis not present

## 2020-10-20 DIAGNOSIS — M25571 Pain in right ankle and joints of right foot: Secondary | ICD-10-CM | POA: Diagnosis not present

## 2020-10-22 DIAGNOSIS — Z4789 Encounter for other orthopedic aftercare: Secondary | ICD-10-CM | POA: Diagnosis not present

## 2020-10-22 DIAGNOSIS — M21611 Bunion of right foot: Secondary | ICD-10-CM | POA: Diagnosis not present

## 2020-10-22 DIAGNOSIS — M21612 Bunion of left foot: Secondary | ICD-10-CM | POA: Diagnosis not present

## 2020-11-04 DIAGNOSIS — I2510 Atherosclerotic heart disease of native coronary artery without angina pectoris: Secondary | ICD-10-CM | POA: Diagnosis not present

## 2020-11-04 DIAGNOSIS — Z955 Presence of coronary angioplasty implant and graft: Secondary | ICD-10-CM | POA: Diagnosis not present

## 2020-11-06 DIAGNOSIS — M7989 Other specified soft tissue disorders: Secondary | ICD-10-CM | POA: Diagnosis not present

## 2020-11-06 DIAGNOSIS — M25571 Pain in right ankle and joints of right foot: Secondary | ICD-10-CM | POA: Diagnosis not present

## 2020-11-06 DIAGNOSIS — Z4789 Encounter for other orthopedic aftercare: Secondary | ICD-10-CM | POA: Diagnosis not present

## 2020-11-10 DIAGNOSIS — Z4789 Encounter for other orthopedic aftercare: Secondary | ICD-10-CM | POA: Diagnosis not present

## 2020-11-10 DIAGNOSIS — M25571 Pain in right ankle and joints of right foot: Secondary | ICD-10-CM | POA: Diagnosis not present

## 2020-11-10 DIAGNOSIS — M7989 Other specified soft tissue disorders: Secondary | ICD-10-CM | POA: Diagnosis not present

## 2020-11-20 DIAGNOSIS — Z4789 Encounter for other orthopedic aftercare: Secondary | ICD-10-CM | POA: Diagnosis not present

## 2020-11-20 DIAGNOSIS — M7989 Other specified soft tissue disorders: Secondary | ICD-10-CM | POA: Diagnosis not present

## 2020-11-20 DIAGNOSIS — M25571 Pain in right ankle and joints of right foot: Secondary | ICD-10-CM | POA: Diagnosis not present

## 2020-11-27 ENCOUNTER — Other Ambulatory Visit: Payer: Self-pay | Admitting: *Deleted

## 2020-11-27 DIAGNOSIS — I10 Essential (primary) hypertension: Secondary | ICD-10-CM

## 2020-11-27 MED ORDER — LOSARTAN POTASSIUM 100 MG PO TABS: 100.0000 mg | ORAL_TABLET | Freq: Every day | ORAL | 1 refills | Status: DC

## 2020-12-03 DIAGNOSIS — M2012 Hallux valgus (acquired), left foot: Secondary | ICD-10-CM | POA: Diagnosis not present

## 2021-01-21 ENCOUNTER — Telehealth: Payer: Self-pay | Admitting: Family Medicine

## 2021-01-21 NOTE — Chronic Care Management (AMB) (Signed)
  Chronic Care Management   Outreach Note  01/21/2021 Name: Issai Krus MRN: [id number] DOB: 1950-12-25  Referred by: Hali Marry, MD Reason for referral : No chief complaint on file.   An unsuccessful telephone outreach was attempted today. The patient was referred to the pharmacist for assistance with care management and care coordination.   Follow Up Plan:   Lauretta Grill Upstream Scheduler

## 2021-02-18 ENCOUNTER — Telehealth: Payer: Self-pay | Admitting: Lab

## 2021-02-18 NOTE — Chronic Care Management (AMB) (Signed)
  Chronic Care Management   Outreach Note  02/18/2021 Name: Joshua Warner MRN: [id number] DOB: [birth year]  Referred by: Hali Marry, MD Reason for referral : Medication Management   Third unsuccessful telephone outreach was attempted today. The patient was referred to the pharmacist for assistance with care management and care coordination.   Follow Up Plan:   [place]

## 2021-03-17 ENCOUNTER — Encounter: Payer: Self-pay | Admitting: Family Medicine

## 2021-03-17 ENCOUNTER — Ambulatory Visit (INDEPENDENT_AMBULATORY_CARE_PROVIDER_SITE_OTHER): Payer: Medicare Other | Admitting: Family Medicine

## 2021-03-17 ENCOUNTER — Other Ambulatory Visit: Payer: Self-pay

## 2021-03-17 ENCOUNTER — Ambulatory Visit (INDEPENDENT_AMBULATORY_CARE_PROVIDER_SITE_OTHER): Payer: Medicare Other

## 2021-03-17 ENCOUNTER — Ambulatory Visit: Payer: Medicare Other | Admitting: Family Medicine

## 2021-03-17 VITALS — BP 125/79 | HR 74 | Temp 98.4°F | Resp 17

## 2021-03-17 DIAGNOSIS — R3129 Other microscopic hematuria: Secondary | ICD-10-CM

## 2021-03-17 DIAGNOSIS — N4889 Other specified disorders of penis: Secondary | ICD-10-CM | POA: Diagnosis not present

## 2021-03-17 DIAGNOSIS — N289 Disorder of kidney and ureter, unspecified: Secondary | ICD-10-CM | POA: Diagnosis not present

## 2021-03-17 DIAGNOSIS — R109 Unspecified abdominal pain: Secondary | ICD-10-CM

## 2021-03-17 LAB — POCT URINALYSIS DIP (CLINITEK)
Bilirubin, UA: NEGATIVE
Glucose, UA: NEGATIVE mg/dL
Ketones, POC UA: NEGATIVE mg/dL
Leukocytes, UA: NEGATIVE
Nitrite, UA: NEGATIVE
POC PROTEIN,UA: NEGATIVE
Spec Grav, UA: 1.01 (ref 1.010–1.025)
Urobilinogen, UA: 0.2 E.U./dL
pH, UA: 6 (ref 5.0–8.0)

## 2021-03-17 MED ORDER — TAMSULOSIN HCL 0.4 MG PO CAPS: 0.4000 mg | ORAL_CAPSULE | Freq: Every day | ORAL | 0 refills | Status: DC

## 2021-03-17 NOTE — Progress Notes (Signed)
Acute Office Visit  Subjective:    Patient ID: Joshua Warner, male    DOB: [birth year], 70 y.o.   MRN: [id number]  Chief Complaint  Patient presents with   Flank Pain     Flank Pain  Patient is in today for possible kidney stone  On Friday patient started having some right hip/groin discomfort so he went to the chiropractor and hip pain improved.  However Saturday he started having right-sided lower back/flank pain but that afternoon it was 10 out of 10 and starting to radiate into his groin.  He was taking Excedrin for some relief.  By Sunday the pain continued to worsen and he applied ice for comfort.  States now the pain seems to be significantly improved in the flank/back.  However he is wondering if he may be passing a kidney stone because now he is having 4-5 out of 10 penile pain.  Reports pain is manageable with aspirin.  He denies any dysuria, fevers, hematuria, body aches, radiating pain into leg, numbness or tingling, rashes.  Reports this feels similar to the kidney stone he had in 2009.  He has not really had any problems since then.  He is scheduled to see PCP on Friday for a routine 15-month follow-up but did not like he could wait until then to have this addressed.   Past Medical History:  Diagnosis Date   Arthritis    CHF (congestive heart failure) (HCC)    stents   Coronary artery disease    stents   GERD (gastroesophageal reflux disease)    Hyperlipidemia    Hypertension since 2007   Kidney stones 2009   Myocardial infarction ([place]) 12/2015   Oxygen deficiency    only during heartattack    Past Surgical History:  Procedure Laterality Date   CORONARY ANGIOPLASTY WITH STENT PLACEMENT  12/2015   KNEE ARTHROSCOPY  12/31/2010   Right knee    TONSILLECTOMY  1957    Family History  Problem Relation Age of Onset   Heart disease Father    Hypertension Mother    Prostate cancer Maternal Uncle        Deceased   Depression Maternal Uncle    Cancer Paternal  Grandfather    Cancer Paternal Grandmother    Colon cancer Neg Hx    Stomach cancer Neg Hx    Colon polyps Neg Hx    Esophageal cancer Neg Hx     Social History   Socioeconomic History   Marital status: Married    Spouse name: Peggy   Number of children: 2   Years of education: 15   Highest education level: Some college, no degree  Occupational History   Occupation: Retired    Fish farm manager: [employer]  Tobacco Use   Smoking status: Never   Smokeless tobacco: Never  Vaping Use   Vaping Use: Never used  Substance and Sexual Activity   Alcohol use: Yes    Alcohol/week: 1.0 standard drink    Types: 1 Glasses of wine per week    Comment: occasionally wine   Drug use: Never   Sexual activity: Not Currently    Partners: Female    Comment: 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  Other Topics Concern   Not on file  Social History Narrative   No regular exercise. Retired from [employer] Strain:  Not on file  Food Insecurity: Not on file  Transportation Needs: Not on file  Physical Activity: Not on file  Stress: Not on file  Social Connections: Not on file  Intimate Partner Violence: Not on file    Outpatient Medications Prior to Visit  Medication Sig Dispense Refill   ASPIRIN ADULT PO Take 81 mg by mouth.      atorvastatin (LIPITOR) 80 MG tablet Take 80 mg by mouth at bedtime.     b complex vitamins tablet Take 1 tablet by mouth daily.     Cholecalciferol 125 MCG (5000 UT) capsule Take 5,000 Units by mouth daily.     Glucosamine-Chondroit-Vit C-Mn (GLUCOSAMINE CHONDR 1500 COMPLX) CAPS Take by mouth daily.     loratadine (CLARITIN) 10 MG tablet Take 10 mg by mouth daily.     losartan (COZAAR) 100 MG tablet Take 1 tablet (100 mg total) by mouth daily. 90 tablet 1   metoprolol tartrate (LOPRESSOR) 25 MG tablet Take 12.5 mg by mouth  2 (two) times daily.     Multiple Vitamin (MULTIVITAMIN) tablet Take 1 tablet by mouth daily.     nitroGLYCERIN (NITROSTAT) 0.4 MG SL tablet PLACE 1 TABLET (0.4 MG TOTAL) UNDER THE TONGUE EVERY 5 (FIVE) MINUTES AS NEEDED. 25 tablet 1   pantoprazole (PROTONIX) 40 MG tablet TAKE 1 TABLET BY MOUTH EVERY DAY 90 tablet 3   vitamin C (ASCORBIC ACID) 500 MG tablet Take 1,000 mg by mouth daily.     No facility-administered medications prior to visit.    Allergies  Allergen Reactions   Celebrex [Celecoxib]     REACTION: rash REACTION: rash   Sulfa Antibiotics     Other reaction(s): Unknown   Sulfonamide Derivatives     REACTION: hives   Lisinopril Other (See Comments)    Cough    Review of Systems  Genitourinary:  Positive for flank pain.  All review of systems negative except what is listed in the HPI     Objective:    Physical Exam Vitals reviewed.  Constitutional:      Appearance: Normal appearance. He is obese.  Abdominal:     Palpations: Abdomen is soft.     Tenderness: There is no right CVA tenderness or left CVA tenderness.  Genitourinary:    Comments: deferred Neurological:     Mental Status: He is alert and oriented to person, place, and time.  Psychiatric:        Mood and Affect: Mood normal.        Behavior: Behavior normal.        Thought Content: Thought content normal.        Judgment: Judgment normal.    BP 125/79   Pulse 74   Temp 98.4 F (36.9 C)   Resp 17   SpO2 93%  Wt Readings from Last 3 Encounters:  09/15/20 229 lb (103.9 kg)  06/17/20 219 lb (99.3 kg)  06/02/20 224 lb (101.6 kg)    Health Maintenance Due  Topic Date Due   Zoster Vaccines- Shingrix (1 of 2) Never done   COVID-19 Vaccine (4 - Booster for Moderna series) 07/24/2020   INFLUENZA VACCINE  01/26/2021    There are no preventive care reminders to display for this patient.   Lab Results  Component Value Date   TSH 1.05 09/05/2018   Lab Results  Component Value Date   WBC  6.6 11/27/2019   HGB 17.6 (H) 11/27/2019   HCT 51.4 11/27/2019   MCV 91.9  11/27/2019   PLT 200.0 11/27/2019   Lab Results  Component Value Date   NA 138 09/26/2020   K 4.7 09/26/2020   CO2 24 09/26/2020   GLUCOSE 91 09/26/2020   BUN 19 09/26/2020   CREATININE 1.08 09/26/2020   BILITOT 0.7 09/26/2020   ALKPHOS 61 08/20/2016   AST 30 09/26/2020   ALT 24 09/26/2020   PROT 6.4 09/26/2020   ALBUMIN 4.1 08/20/2016   CALCIUM 9.3 09/26/2020   Lab Results  Component Value Date   CHOL 116 09/26/2020   Lab Results  Component Value Date   HDL 44 09/26/2020   Lab Results  Component Value Date   LDLCALC 57 09/26/2020   Lab Results  Component Value Date   TRIG 70 09/26/2020   Lab Results  Component Value Date   CHOLHDL 2.6 09/26/2020   No results found for: HGBA1C     Assessment & Plan:   Problem List Items Addressed This Visit   None Visit Diagnoses     Flank pain, acute    -  Primary   Relevant Medications   tamsulosin (FLOMAX) 0.4 MG CAPS capsule   Other Relevant Orders   CBC with Differential   COMPLETE METABOLIC PANEL WITH GFR   DG Abd 1 View   POCT URINALYSIS DIP (CLINITEK) (Completed)   Penile pain       Relevant Medications   tamsulosin (FLOMAX) 0.4 MG CAPS capsule   Other Relevant Orders   CBC with Differential   COMPLETE METABOLIC PANEL WITH GFR   DG Abd 1 View   Other microscopic hematuria       Relevant Medications   tamsulosin (FLOMAX) 0.4 MG CAPS capsule   Other Relevant Orders   CBC with Differential   COMPLETE METABOLIC PANEL WITH GFR   DG Abd 1 View      UA with moderate blood but negative for leuks.  Kidney stone likely.  Discussed options with patient's and he prefers to start with a KUB instead of CT scan.  We will go ahead and start Flomax -caution flag for allergy due to sulfa/Celebrex allergy, but is pretty positive he is taken in the past with no problem.  Encouraged him to watch for allergic reactions and have Benadryl on hand.   Stop the medicine if he has a reaction.  We will go ahead and check CBC and CMP as well.  Encouraged him to stay well-hydrated with water and strain his urine (filter provided).  Patient understands that x-ray is not the most sensitive test, but reports this is what he did in the past.  Denies any additional pain management at this time.  Can continue Tylenol and ibuprofen as needed.  He is scheduled to follow-up with PCP on Friday. Patient aware of signs/symptoms requiring further/urgent evaluation.   Meds ordered this encounter  Medications   tamsulosin (FLOMAX) 0.4 MG CAPS capsule    Sig: Take 1 capsule (0.4 mg total) by mouth daily.    Dispense:  30 capsule    Refill:  0   Follow-up with PCP as scheduled.  Terrilyn Saver, NP

## 2021-03-17 NOTE — Patient Instructions (Signed)
Xray today Flomax ordered - have benadryl on hand in case you develop a rash. Stop the medication if you have a reaction Stay well hydrated with water Filter your urine Blood work ordered today  Keep follow-up with Dr. Madilyn Fireman for later this week

## 2021-03-18 LAB — COMPLETE METABOLIC PANEL WITH GFR
AG Ratio: 2 (calc) (ref 1.0–2.5)
ALT: 24 U/L (ref 9–46)
AST: 26 U/L (ref 10–35)
Albumin: 4.3 g/dL (ref 3.6–5.1)
Alkaline phosphatase (APISO): 90 U/L (ref 35–144)
BUN/Creatinine Ratio: 12 (calc) (ref 6–22)
BUN: 17 mg/dL (ref 7–25)
CO2: 29 mmol/L (ref 20–32)
Calcium: 9.4 mg/dL (ref 8.6–10.3)
Chloride: 103 mmol/L (ref 98–110)
Creat: 1.44 mg/dL — ABNORMAL HIGH (ref 0.70–1.35)
Globulin: 2.2 g/dL (calc) (ref 1.9–3.7)
Glucose, Bld: 113 mg/dL — ABNORMAL HIGH (ref 65–99)
Potassium: 4.5 mmol/L (ref 3.5–5.3)
Sodium: 139 mmol/L (ref 135–146)
Total Bilirubin: 0.5 mg/dL (ref 0.2–1.2)
Total Protein: 6.5 g/dL (ref 6.1–8.1)
eGFR: 53 mL/min/{1.73_m2} — ABNORMAL LOW (ref >=60)

## 2021-03-18 LAB — CBC WITH DIFFERENTIAL/PLATELET
Absolute Monocytes: 576 cells/uL (ref 200–950)
Basophils Absolute: 43 cells/uL (ref 0–200)
Basophils Relative: 0.6 %
Eosinophils Absolute: 288 cells/uL (ref 15–500)
Eosinophils Relative: 4 %
HCT: 51.3 % — ABNORMAL HIGH (ref 38.5–50.0)
Hemoglobin: 17.3 g/dL — ABNORMAL HIGH (ref 13.2–17.1)
Lymphs Abs: 2484 cells/uL (ref 850–3900)
MCH: 30.9 pg (ref 27.0–33.0)
MCHC: 33.7 g/dL (ref 32.0–36.0)
MCV: 91.8 fL (ref 80.0–100.0)
MPV: 11 fL (ref 7.5–12.5)
Monocytes Relative: 8 %
Neutro Abs: 3809 cells/uL (ref 1500–7800)
Neutrophils Relative %: 52.9 %
Platelets: 210 10*3/uL (ref 140–400)
RBC: 5.59 10*6/uL (ref 4.20–5.80)
RDW: 12.6 % (ref 11.0–15.0)
Total Lymphocyte: 34.5 %
WBC: 7.2 10*3/uL (ref 3.8–10.8)

## 2021-03-19 ENCOUNTER — Encounter: Payer: Self-pay | Admitting: Family Medicine

## 2021-03-19 ENCOUNTER — Other Ambulatory Visit: Payer: Self-pay

## 2021-03-19 ENCOUNTER — Ambulatory Visit (INDEPENDENT_AMBULATORY_CARE_PROVIDER_SITE_OTHER): Payer: Medicare Other

## 2021-03-19 DIAGNOSIS — N200 Calculus of kidney: Secondary | ICD-10-CM | POA: Diagnosis not present

## 2021-03-19 DIAGNOSIS — R3129 Other microscopic hematuria: Secondary | ICD-10-CM | POA: Diagnosis not present

## 2021-03-19 DIAGNOSIS — R109 Unspecified abdominal pain: Secondary | ICD-10-CM

## 2021-03-19 DIAGNOSIS — N4889 Other specified disorders of penis: Secondary | ICD-10-CM

## 2021-03-19 DIAGNOSIS — K449 Diaphragmatic hernia without obstruction or gangrene: Secondary | ICD-10-CM | POA: Diagnosis not present

## 2021-03-19 DIAGNOSIS — K5730 Diverticulosis of large intestine without perforation or abscess without bleeding: Secondary | ICD-10-CM | POA: Diagnosis not present

## 2021-03-19 DIAGNOSIS — D7389 Other diseases of spleen: Secondary | ICD-10-CM | POA: Diagnosis not present

## 2021-03-19 NOTE — Addendum Note (Signed)
Addended by: Caleen Jobs B on: 03/19/2021 12:29 PM   Modules accepted: Orders

## 2021-03-19 NOTE — Telephone Encounter (Signed)
Xray unremarkable. Patient still experiencing discomfort and has not passed a stone yet. He is willing to proceed with CT renal stone study.

## 2021-03-19 NOTE — Progress Notes (Signed)
MyChart message sent: The xray was negative, but like we discussed it is not the most sensitive test for kidney stones. How are you feeling? Have you passed a stone? If not improving and you would like the CT to confirm, please let me or Dr. Madilyn Fireman know. Hope you are feeling better!

## 2021-03-20 ENCOUNTER — Encounter: Payer: Self-pay | Admitting: Family Medicine

## 2021-03-20 ENCOUNTER — Ambulatory Visit (INDEPENDENT_AMBULATORY_CARE_PROVIDER_SITE_OTHER): Payer: Medicare Other | Admitting: Family Medicine

## 2021-03-20 VITALS — BP 133/73 | HR 64 | Ht 70.0 in | Wt 228.0 lb

## 2021-03-20 DIAGNOSIS — L989 Disorder of the skin and subcutaneous tissue, unspecified: Secondary | ICD-10-CM

## 2021-03-20 DIAGNOSIS — Z6832 Body mass index (BMI) 32.0-32.9, adult: Secondary | ICD-10-CM

## 2021-03-20 DIAGNOSIS — Z23 Encounter for immunization: Secondary | ICD-10-CM

## 2021-03-20 DIAGNOSIS — L570 Actinic keratosis: Secondary | ICD-10-CM | POA: Diagnosis not present

## 2021-03-20 DIAGNOSIS — T50905A Adverse effect of unspecified drugs, medicaments and biological substances, initial encounter: Secondary | ICD-10-CM

## 2021-03-20 DIAGNOSIS — N200 Calculus of kidney: Secondary | ICD-10-CM

## 2021-03-20 MED ORDER — FLUOROURACIL 5 % EX CREA
TOPICAL_CREAM | Freq: Two times a day (BID) | CUTANEOUS | 0 refills | Status: DC
Start: 2021-03-20 — End: 2022-04-29

## 2021-03-20 NOTE — Patient Instructions (Signed)
Is go to the lab in about 2 weeks so that we can recheck your blood work to retest your kidney function.

## 2021-03-20 NOTE — Progress Notes (Signed)
Established Patient Office Visit  Subjective:  Patient ID: Joshua Warner, male    DOB: 07-May-1951  Age: 70 y.o. MRN: [id number]  CC:  Chief Complaint  Patient presents with   Hypertension    HPI [name] presents for   Hypertension- Pt denies chest pain, SOB, dizziness, or heart palpitations.  Taking meds as directed w/o problems.  Denies medication side effects.    Recently had labs which noted a slight elevation in renal function from 1.0-1.4. he id due to repeat that in 2 weeks.    Also had a recent CT after coming in for flank pain.  Finding included:  Chronic elevation of right hemidiaphragm - he is aware of this Scar tissue in that area - this is new Ascending aortic aneurysm - has been followed by cardiology for this.  No hx of consult with CVTS.  Numerous renal calculi in the kidneys 3 mm stone in the bladder.    His pain is better. Thought doesn't think he has passed the stone completely .  Started the flomax to help move the stone and started ot et an itchy rash on his right hand. Similar to rash with other meds.  No SOB or diffuse rash. He stopped the med after 3 doses.     Also som skin lesion on left side of forehead and temple that he would like me to look at today and a lesion on his left calf.     Past Medical History:  Diagnosis Date   Arthritis    CHF (congestive heart failure) (HCC)    stents   Coronary artery disease    stents   GERD (gastroesophageal reflux disease)    Hyperlipidemia    Hypertension since 2007   Kidney stones 2009   Myocardial infarction ([place]) 12/2015   Oxygen deficiency    only during heartattack    Past Surgical History:  Procedure Laterality Date   CORONARY ANGIOPLASTY WITH STENT PLACEMENT  12/2015   KNEE ARTHROSCOPY  12/31/2010   Right knee    TONSILLECTOMY  1957    Family History  Problem Relation Age of Onset   Heart disease Father    Hypertension Mother    Prostate cancer Maternal Uncle         Deceased   Depression Maternal Uncle    Cancer Paternal Grandfather    Cancer Paternal Grandmother    Colon cancer Neg Hx    Stomach cancer Neg Hx    Colon polyps Neg Hx    Esophageal cancer Neg Hx     Social History   Socioeconomic History   Marital status: Married    Spouse name: Peggy   Number of children: 2   Years of education: 15   Highest education level: Some college, no degree  Occupational History   Occupation: Retired    Fish farm manager: [employer]  Tobacco Use   Smoking status: Never   Smokeless tobacco: Never  Vaping Use   Vaping Use: Never used  Substance and Sexual Activity   Alcohol use: Yes    Alcohol/week: 1.0 standard drink    Types: 1 Glasses of wine per week    Comment: occasionally wine   Drug use: Never   Sexual activity: Not Currently    Partners: Female    Comment: 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  Other Topics Concern   Not on file  Social  History Narrative   No regular exercise. Retired from [employer] Strain: Not on [employer] Insecurity: Not on file  Transportation Needs: Not on file  Physical Activity: Not on file  Stress: Not on file  Social Connections: Not on file  Intimate Partner Violence: Not on file    Outpatient Medications Prior to Visit  Medication Sig Dispense Refill   ASPIRIN ADULT PO Take 81 mg by mouth.      atorvastatin (LIPITOR) 80 MG tablet Take 80 mg by mouth at bedtime.     b complex vitamins tablet Take 1 tablet by mouth daily.     Cholecalciferol 125 MCG (5000 UT) capsule Take 5,000 Units by mouth daily.     Glucosamine-Chondroit-Vit C-Mn (GLUCOSAMINE CHONDR 1500 COMPLX) CAPS Take by mouth daily.     loratadine (CLARITIN) 10 MG tablet Take 10 mg by mouth daily.     losartan (COZAAR) 100 MG tablet Take 1 tablet (100 mg total) by mouth daily. 90 tablet 1    metoprolol tartrate (LOPRESSOR) 25 MG tablet Take 12.5 mg by mouth 2 (two) times daily.     Multiple Vitamin (MULTIVITAMIN) tablet Take 1 tablet by mouth daily.     pantoprazole (PROTONIX) 40 MG tablet TAKE 1 TABLET BY MOUTH EVERY DAY 90 tablet 3   vitamin C (ASCORBIC ACID) 500 MG tablet Take 1,000 mg by mouth daily.     nitroGLYCERIN (NITROSTAT) 0.4 MG SL tablet PLACE 1 TABLET (0.4 MG TOTAL) UNDER THE TONGUE EVERY 5 (FIVE) MINUTES AS NEEDED. 25 tablet 1   tamsulosin (FLOMAX) 0.4 MG CAPS capsule Take 1 capsule (0.4 mg total) by mouth daily. 30 capsule 0   No facility-administered medications prior to visit.    Allergies  Allergen Reactions   Celebrex [Celecoxib]     REACTION: rash REACTION: rash   Sulfa Antibiotics     Other reaction(s): Unknown   Sulfonamide Derivatives     REACTION: hives   Lisinopril Other (See Comments)    Cough   Tamsulosin Itching and Rash    ROS Review of Systems    Objective:    Physical Exam  BP 133/73   Pulse 64   Ht _0  (1.778 m)   Wt 228 lb (103.4 kg)   SpO2 97%   BMI 32.71 kg/m  Wt Readings from Last 3 Encounters:  03/20/21 228 lb (103.4 kg)  09/15/20 229 lb (103.9 kg)  06/17/20 219 lb (99.3 kg)     Health Maintenance Due  Topic Date Due   Zoster Vaccines- Shingrix (1 of 2) Never done    There are no preventive care reminders to display for this patient.  Lab Results  Component Value Date   TSH 1.05 09/05/2018   Lab Results  Component Value Date   WBC 7.2 03/17/2021   HGB 17.3 (H) 03/17/2021   HCT 51.3 (H) 03/17/2021   MCV 91.8 03/17/2021   PLT 210 03/17/2021   Lab Results  Component Value Date   NA 139 03/17/2021   K 4.5 03/17/2021   CO2 29 03/17/2021   GLUCOSE 113 (H) 03/17/2021   BUN 17 03/17/2021   CREATININE 1.44 (H) 03/17/2021   BILITOT 0.5 03/17/2021   ALKPHOS 61 08/20/2016   AST 26 03/17/2021   ALT 24 03/17/2021   PROT 6.5 03/17/2021   ALBUMIN 4.1 08/20/2016   CALCIUM 9.4 03/17/2021   EGFR 53 (L)  03/17/2021   Lab Results  Component  Value Date   CHOL 116 09/26/2020   Lab Results  Component Value Date   HDL 44 09/26/2020   Lab Results  Component Value Date   LDLCALC 57 09/26/2020   Lab Results  Component Value Date   TRIG 70 09/26/2020   Lab Results  Component Value Date   CHOLHDL 2.6 09/26/2020   No results found for: HGBA1C    Assessment & Plan:   Problem List Items Addressed This Visit       Musculoskeletal and Integument   Actinic keratosis - Primary    Recommend treatment with Efudex cream to the lesion.  Consider cryotherapy if not responding.          Genitourinary   Kidney stone    Likely still in the bladder.  Drink plenty of fluid.  Ok to use NSAIDS for pain.          Other   BMI 32.0-32.9,adult    Would like to see him get more active and work on weight loss. BMI used to be 28.       Other Visit Diagnoses     Skin lesion       Relevant Medications   fluorouracil (EFUDEX) 5 % cream   Need for immunization against influenza       Relevant Orders   Flu Vaccine QUAD High Dose(Fluad) (Completed)   Adverse effect of drug, initial encounter           Skin lesion  on calf - recommend to schedule biopsy next moth.    Adverse drug affect. Added to intolerance list.  Symptoms mild.     Meds ordered this encounter  Medications   fluorouracil (EFUDEX) 5 % cream    Sig: Apply topically 2 (two) times daily. X 4 weeks to skin lesions on forehead and temple on the left side. Do not get near eye.    Dispense:  40 g    Refill:  0    Follow-up: Return in about 4 weeks (around 04/17/2021) for biopsy of skin lesion on the left calf.    Beatrice Lecher, MD

## 2021-03-21 DIAGNOSIS — N200 Calculus of kidney: Secondary | ICD-10-CM | POA: Insufficient documentation

## 2021-03-21 DIAGNOSIS — L570 Actinic keratosis: Secondary | ICD-10-CM | POA: Insufficient documentation

## 2021-03-21 NOTE — Assessment & Plan Note (Signed)
Recommend treatment with Efudex cream to the lesion.  Consider cryotherapy if not responding.

## 2021-03-21 NOTE — Assessment & Plan Note (Signed)
Would like to see him get more active and work on weight loss. BMI used to be 28.

## 2021-03-21 NOTE — Assessment & Plan Note (Signed)
Likely still in the bladder.  Drink plenty of fluid.  Ok to use NSAIDS for pain.

## 2021-03-25 ENCOUNTER — Ambulatory Visit (INDEPENDENT_AMBULATORY_CARE_PROVIDER_SITE_OTHER): Payer: Medicare Other | Admitting: Family Medicine

## 2021-03-25 DIAGNOSIS — Z0000 Encounter for general adult medical examination without abnormal findings: Secondary | ICD-10-CM

## 2021-03-25 NOTE — Progress Notes (Signed)
MEDICARE ANNUAL WELLNESS VISIT  03/25/2021  Telephone Visit Disclaimer This Medicare AWV was conducted by telephone due to national recommendations for restrictions regarding the COVID-19 Pandemic (e.g. social distancing).  I verified, using two identifiers, that I am speaking with Joshua Warner or their authorized healthcare agent. I discussed the limitations, risks, security, and privacy concerns of performing an evaluation and management service by telephone and the potential availability of an in-person appointment in the future. The patient expressed understanding and agreed to proceed.  Location of Patient: Home Location of Provider (nurse): In the office.  Subjective:    Joshua Warner is a 70 y.o. male patient of Metheney, Rene Kocher, MD who had a Medicare Annual Wellness Visit today via telephone. Joshua Warner is Retired and lives with their spouse. he has 2 children. he reports that he is socially active and does interact with friends/family regularly. he is moderately physically active and enjoys going to the gym, reading and watching television.  Patient Care Team: Joshua Marry, MD as PCP - General Joshua Medico, MD as Referring Physician (Cardiology)  Advanced Directives 03/25/2021 07/31/2019 11/18/2016 08/16/2014  Does Patient Have a Medical Advance Directive? No No Yes No  Does patient want to make changes to medical advance directive? - - No - Patient declined -  Would patient like information on creating a medical advance directive? No - Patient declined No - Patient declined - No - patient declined information    Hospital Utilization Over the Past 12 Months: # of hospitalizations or ER visits: 0 # of surgeries: 1  Review of Systems    Patient reports that his overall health is worse compared to last year.  History obtained from chart review and the patient  Patient Reported Readings (BP, Pulse, CBG, Weight, etc) none  Pain Assessment Pain : No/denies  pain     Current Medications & Allergies (verified) Allergies as of 03/25/2021       Reactions   Celebrex [celecoxib]    REACTION: rash REACTION: rash   Sulfa Antibiotics    Other reaction(s): Unknown   Sulfonamide Derivatives    REACTION: hives   Lisinopril Other (See Comments)   Cough   Tamsulosin Itching, Rash        Medication List        Accurate as of March 25, 2021  9:20 AM. If you have any questions, ask your nurse or doctor.          ASPIRIN ADULT PO Take 81 mg by mouth.   atorvastatin 80 MG tablet Commonly known as: LIPITOR Take 80 mg by mouth at bedtime.   b complex vitamins tablet Take 1 tablet by mouth daily.   Cholecalciferol 125 MCG (5000 UT) capsule Take 5,000 Units by mouth daily.   fluorouracil 5 % cream Commonly known as: Efudex Apply topically 2 (two) times daily. X 4 weeks to skin lesions on forehead and temple on the left side. Do not get near eye.   Glucosamine Chondr 1500 Complx Caps Take by mouth daily.   loratadine 10 MG tablet Commonly known as: CLARITIN Take 10 mg by mouth daily.   losartan 100 MG tablet Commonly known as: COZAAR Take 1 tablet (100 mg total) by mouth daily.   metoprolol tartrate 25 MG tablet Commonly known as: LOPRESSOR Take 12.5 mg by mouth 2 (two) times daily.   multivitamin tablet Take 1 tablet by mouth daily.   nitroGLYCERIN 0.4 MG SL tablet Commonly known as: NITROSTAT PLACE 1 TABLET (  0.4 MG TOTAL) UNDER THE TONGUE EVERY 5 (FIVE) MINUTES AS NEEDED.   pantoprazole 40 MG tablet Commonly known as: PROTONIX TAKE 1 TABLET BY MOUTH EVERY DAY   vitamin C 500 MG tablet Commonly known as: ASCORBIC ACID Take 1,000 mg by mouth daily.        History (reviewed): Past Medical History:  Diagnosis Date   Arthritis    CHF (congestive heart failure) ([place])    stents   Coronary artery disease    stents   GERD (gastroesophageal reflux disease)    Hyperlipidemia    Hypertension since 2007    Kidney stone 2022   Kidney stones 2009   Myocardial infarction ([place]) 12/2015   Oxygen deficiency    only during heartattack   Past Surgical History:  Procedure Laterality Date   CORONARY ANGIOPLASTY WITH STENT PLACEMENT  12/2015   KNEE ARTHROSCOPY  12/31/2010   Right knee    TONSILLECTOMY  1957   Family History  Problem Relation Age of Onset   Heart disease Father    Hypertension Mother    Prostate cancer Maternal Uncle        Deceased   Depression Maternal Uncle    Cancer Paternal Grandfather    Cancer Paternal Grandmother    Colon cancer Neg Hx    Stomach cancer Neg Hx    Colon polyps Neg Hx    Esophageal cancer Neg Hx    Social History   Socioeconomic History   Marital status: Married    Spouse name: Peggy   Number of children: 2   Years of education: 15   Highest education level: Some college, no degree  Occupational History   Occupation: Retired    Fish farm manager: [employer]  Tobacco Use   Smoking status: Never   Smokeless tobacco: Never  Vaping Use   Vaping Use: Never used  Substance and Sexual Activity   Alcohol use: Yes    Alcohol/week: 1.0 standard drink    Types: 1 Glasses of wine per week    Comment: occasionally wine   Drug use: Never   Sexual activity: Not Currently    Partners: Female    Comment: 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  Other Topics Concern   Not on file  Social History Narrative   Lives with his wife. He enjoys reading, watching t.v. and going to the gym.   Social Determinants of Health   Financial Resource Strain: Low Risk    Difficulty of Paying Living Expenses: Not hard at all  Food Insecurity: No Food Insecurity   Worried About Charity fundraiser in the Last Year: Never true   [place] in the Last Year: Never true  Transportation Needs: No Transportation Needs   Lack of Transportation (Medical): No   Lack of Transportation  (Non-Medical): No  Physical Activity: Insufficiently Active   Days of Exercise per Week: 3 days   Minutes of Exercise per Session: 40 min  Stress: No Stress Concern Present   Feeling of Stress : Not at all  Social Connections: Socially Integrated   Frequency of Communication with Friends and Family: More than three times a week   Frequency of Social Gatherings with Friends and Family: Never   Attends Religious Services: More than 4 times per year   Active Member of [employer] or Organizations: Yes   Attends Archivist Meetings: More than 4  times per year   Marital Status: Married    Activities of Daily Living In your present state of health, do you have any difficulty performing the following activities: 03/25/2021  Hearing? N  Comment wears bilateral hearing aids.  Vision? N  Difficulty concentrating or making decisions? N  Walking or climbing stairs? Y  Comment has noticed that he is slightly out of breath when he is climbing the stairs.  Dressing or bathing? N  Doing errands, shopping? N  Preparing Food and eating ? N  Using the Toilet? N  In the past six months, have you accidently leaked urine? N  Do you have problems with loss of bowel control? Y  Comment just when he had diarrhea.  Managing your Medications? N  Managing your Finances? N  Housekeeping or managing your Housekeeping? N  Some recent data might be hidden    Patient Education/ Literacy How often do you need to have someone help you when you read instructions, pamphlets, or other written materials from your doctor or pharmacy?: 1 - Never What is the last grade level you completed in school?: high school diploma and some college.  Exercise Current Exercise Habits: Home exercise routine;Structured exercise class, Type of exercise: walking;treadmill;Other - see comments (aerobic exercise), Time (Minutes): 40, Frequency (Times/Week): 3, Weekly Exercise (Minutes/Week): 120, Intensity: Moderate, Exercise  limited by: None identified  Diet Patient reports consuming 3 meals a day and 2 snack(s) a day Patient reports that his primary diet is: Regular Patient reports that she does have regular access to food.   Depression Screen PHQ 2/9 Scores 03/25/2021 09/15/2020 07/31/2019 09/11/2018 03/13/2018 09/09/2017 03/04/2017  PHQ - 2 Score 0 0 0 0 0 0 0     Fall Risk Fall Risk  03/25/2021 09/15/2020 07/31/2019 03/13/2019 03/13/2018  Falls in the past year? 0 0 0 0 No  Number falls in past yr: 0 - - - -  Injury with Fall? 0 - - - -  Risk for fall due to : No Fall Risks No Fall Risks No Fall Risks - -  Follow up Falls evaluation completed - Falls prevention discussed - -     Objective:  Joshua Warner seemed alert and oriented and he participated appropriately during our telephone visit.  Blood Pressure Weight BMI  BP Readings from Last 3 Encounters:  03/20/21 133/73  03/17/21 125/79  09/15/20 128/64   Wt Readings from Last 3 Encounters:  03/20/21 228 lb (103.4 kg)  09/15/20 229 lb (103.9 kg)  06/17/20 219 lb (99.3 kg)   BMI Readings from Last 1 Encounters:  03/20/21 32.71 kg/m    *Unable to obtain current vital signs, weight, and BMI due to telephone visit type  Hearing/Vision  Joshua Warner did not seem to have difficulty with hearing/understanding during the telephone conversation Reports that he has had a formal eye exam by an eye care professional within the past year Reports that he has not had a formal hearing evaluation within the past year *Unable to fully assess hearing and vision during telephone visit type  Cognitive Function: 6CIT Screen 03/25/2021 07/31/2019  What Year? 0 points 0 points  What month? 0 points 0 points  What time? 0 points 0 points  Count back from 20 0 points 0 points  Months in reverse 0 points 0 points  Repeat phrase 0 points 0 points  Total Score 0 0   (Normal:0-7, Significant for Dysfunction: >8)  Normal Cognitive Function Screening: Yes   Immunization &  Health Maintenance  Record Immunization History  Administered Date(s) Administered   Fluad Quad(high Dose 65+) 03/13/2019, 03/17/2020, 03/20/2021   Influenza Split 03/28/2012, 03/28/2012   Influenza Whole 04/28/2009, 03/28/2010   Influenza, High Dose Seasonal PF 03/04/2017   Influenza, Seasonal, Injecte, Preservative Fre 03/22/2014, 03/28/2015, 02/20/2016   Influenza,inj,Quad PF,6+ Mos 03/22/2014, 03/28/2015, 02/20/2016, 03/13/2018   Influenza-Unspecified 04/19/2013   Moderna SARS-COV2 Booster Vaccination 05/01/2020, 01/01/2021   Moderna Sars-Covid-2 Vaccination 07/30/2019, 08/27/2019   Pneumococcal Conjugate-13 03/04/2017   Pneumococcal Polysaccharide-23 09/11/2018   Td 04/28/2008   Tdap 09/13/2018   Zoster, Live 12/14/2013    Health Maintenance  Topic Date Due   Zoster Vaccines- Shingrix (1 of 2) Never done   COVID-19 Vaccine (5 - Booster for Moderna series) 05/04/2021   TETANUS/TDAP  09/12/2028   COLONOSCOPY (Pts 45-69yrs Insurance coverage will need to be confirmed)  12/09/2029   INFLUENZA VACCINE  Completed   Hepatitis C Screening  Completed   HPV VACCINES  Aged Out       Assessment  This is a routine wellness examination for Joshua Warner.  Health Maintenance: Due or Overdue Health Maintenance Due  Topic Date Due   Zoster Vaccines- Shingrix (1 of 2) Never done    Joshua Warner does not need a referral for Community Assistance: Care Management:   no Social Work:    no Prescription Assistance:  no Nutrition/Diabetes Education:  no   Plan:  Personalized Goals  Goals Addressed               This Visit's Progress     Patient Stated (pt-stated)        Patient would like to 20 lbs.       Personalized Health Maintenance & Screening Recommendations  Shingles vaccine  Lung Cancer Screening Recommended: no (Low Dose CT Chest recommended if Age 23-80 years, 30 pack-year currently smoking OR have quit w/in past 15 years) Hepatitis C Screening  recommended: no HIV Screening recommended: no  Advanced Directives: Written information was not prepared per patient's request.  Referrals & Orders No orders of the defined types were placed in this encounter.   Follow-up Plan Follow-up with Joshua Marry, MD as planned Schedule your appointment at the pharmacy for your shingles vaccine.  Medicare wellness visit in one year. Patient will access AVS on mychart.   I have personally reviewed and noted the following in the patient's chart:   Medical and social history Use of alcohol, tobacco or illicit drugs  Current medications and supplements Functional ability and status Nutritional status Physical activity Advanced directives List of other physicians Hospitalizations, surgeries, and ER visits in previous 12 months Vitals Screenings to include cognitive, depression, and falls Referrals and appointments  In addition, I have reviewed and discussed with Joshua Warner certain preventive protocols, quality metrics, and best practice recommendations. A written personalized care plan for preventive services as well as general preventive health recommendations is available and can be mailed to the patient at his request.      Joshua Gens, RN  03/25/2021

## 2021-03-25 NOTE — Patient Instructions (Addendum)
[facility] Maintenance Summary and Written Plan of Care  [name] ,  Thank you for allowing me to perform your Medicare Annual Wellness Visit and for your ongoing commitment to your health.   Health Maintenance & Immunization History Health Maintenance  Topic Date Due   Zoster Vaccines- Shingrix (1 of 2) 06/24/2021 (Originally 01/14/1970)   COVID-19 Vaccine (5 - Booster for Moderna series) 05/04/2021   TETANUS/TDAP  09/12/2028   COLONOSCOPY (Pts 45-32yrs Insurance coverage will need to be confirmed)  12/09/2029   INFLUENZA VACCINE  Completed   Hepatitis C Screening  Completed   HPV VACCINES  Aged Out   Immunization History  Administered Date(s) Administered   Fluad Quad(high Dose 65+) 03/13/2019, 03/17/2020, 03/20/2021   Influenza Split 03/28/2012, 03/28/2012   Influenza Whole 04/28/2009, 03/28/2010   Influenza, High Dose Seasonal PF 03/04/2017   Influenza, Seasonal, Injecte, Preservative Fre 03/22/2014, 03/28/2015, 02/20/2016   Influenza,inj,Quad PF,6+ Mos 03/22/2014, 03/28/2015, 02/20/2016, 03/13/2018   Influenza-Unspecified 04/19/2013   Moderna SARS-COV2 Booster Vaccination 05/01/2020, 01/01/2021   Moderna Sars-Covid-2 Vaccination 07/30/2019, 08/27/2019   Pneumococcal Conjugate-13 03/04/2017   Pneumococcal Polysaccharide-23 09/11/2018   Td 04/28/2008   Tdap 09/13/2018   Zoster, Live 12/14/2013    These are the patient goals that we discussed:  Goals Addressed               This Visit's Progress     Patient Stated (pt-stated)        Patient would like to 20 lbs.         This is a list of Health Maintenance Items that are overdue or due now: Shingles vaccine    Orders/Referrals Placed Today: No orders of the defined types were placed in this encounter.  (Contact our referral department at [phone] if you have not spoken with someone about your referral appointment within the next 5 days)    Follow-up Plan Follow-up  with Hali Marry, MD as planned Schedule your appointment at the pharmacy for your shingles vaccine.  Medicare wellness visit in one year. Patient will access AVS on mychart.    Health Maintenance, Male Adopting a healthy lifestyle and getting preventive care are important in promoting health and wellness. Ask your health care provider about: The right schedule for you to have regular tests and exams. Things you can do on your own to prevent diseases and keep yourself healthy. What should I know about diet, weight, and exercise? Eat a healthy diet  Eat a diet that includes plenty of vegetables, fruits, low-fat dairy products, and lean protein. Do not eat a lot of foods that are high in solid fats, added sugars, or sodium. Maintain a healthy weight Body mass index (BMI) is a measurement that can be used to identify possible weight problems. It estimates body fat based on height and weight. Your health care provider can help determine your BMI and help you achieve or maintain a healthy weight. Get regular exercise Get regular exercise. This is one of the most important things you can do for your health. Most adults should: Exercise for at least 150 minutes each week. The exercise should increase your heart rate and make you sweat (moderate-intensity exercise). Do strengthening exercises at least twice a week. This is in addition to the moderate-intensity exercise. Spend less time sitting. Even light physical activity can be beneficial. Watch cholesterol and blood lipids Have your blood tested for lipids and cholesterol at 69 years of age, then have this test every  5 years. You may need to have your cholesterol levels checked more often if: Your lipid or cholesterol levels are high. You are older than 69 years of age. You are at high risk for heart disease. What should I know about cancer screening? Many types of cancers can be detected early and may often be prevented. Depending  on your health history and family history, you may need to have cancer screening at various ages. This may include screening for: Colorectal cancer. Prostate cancer. Skin cancer. Lung cancer. What should I know about heart disease, diabetes, and high blood pressure? Blood pressure and heart disease High blood pressure causes heart disease and increases the risk of stroke. This is more likely to develop in people who have high blood pressure readings, are of African descent, or are overweight. Talk with your health care provider about your target blood pressure readings. Have your blood pressure checked: Every 3-5 years if you are 70-34 years of age. Every year if you are 16 years old or older. If you are between the ages of 41 and 16 and are a current or former smoker, ask your health care provider if you should have a one-time screening for abdominal aortic aneurysm (AAA). Diabetes Have regular diabetes screenings. This checks your fasting blood sugar level. Have the screening done: Once every three years after age 68 if you are at a normal weight and have a low risk for diabetes. More often and at a younger age if you are overweight or have a high risk for diabetes. What should I know about preventing infection? Hepatitis B If you have a higher risk for hepatitis B, you should be screened for this virus. Talk with your health care provider to find out if you are at risk for hepatitis B infection. Hepatitis C Blood testing is recommended for: Everyone born from [birth year] through 1965. Anyone with known risk factors for hepatitis C. Sexually transmitted infections (STIs) You should be screened each year for STIs, including gonorrhea and chlamydia, if: You are sexually active and are younger than 70 years of age. You are older than 69 years of age and your health care provider tells you that you are at risk for this type of infection. Your sexual activity has changed since you were last  screened, and you are at increased risk for chlamydia or gonorrhea. Ask your health care provider if you are at risk. Ask your health care provider about whether you are at high risk for HIV. Your health care provider may recommend a prescription medicine to help prevent HIV infection. If you choose to take medicine to prevent HIV, you should first get tested for HIV. You should then be tested every 3 months for as long as you are taking the medicine. Follow these instructions at home: Lifestyle Do not use any products that contain nicotine or tobacco, such as cigarettes, e-cigarettes, and chewing tobacco. If you need help quitting, ask your health care provider. Do not use street drugs. Do not share needles. Ask your health care provider for help if you need support or information about quitting drugs. Alcohol use Do not drink alcohol if your health care provider tells you not to drink. If you drink alcohol: Limit how much you have to 0-2 drinks a day. Be aware of how much alcohol is in your drink. In the [place] one drink equals one 12 oz bottle of beer (355 mL), one 5 oz glass of wine (148 mL), or one 1 oz glass  of hard liquor (44 mL). General instructions Schedule regular health, dental, and eye exams. Stay current with your vaccines. Tell your health care provider if: You often feel depressed. You have ever been abused or do not feel safe at home. Summary Adopting a healthy lifestyle and getting preventive care are important in promoting health and wellness. Follow your health care provider's instructions about healthy diet, exercising, and getting tested or screened for diseases. Follow your health care provider's instructions on monitoring your cholesterol and blood pressure. This information is not intended to replace advice given to you by your health care provider. Make sure you discuss any questions you have with your health care provider. Document Revised: 08/22/2020 Document  Reviewed: 06/07/2018 Elsevier Patient Education  2022 [employer].

## 2021-03-30 DIAGNOSIS — N289 Disorder of kidney and ureter, unspecified: Secondary | ICD-10-CM | POA: Diagnosis not present

## 2021-03-30 DIAGNOSIS — R3129 Other microscopic hematuria: Secondary | ICD-10-CM | POA: Diagnosis not present

## 2021-03-31 LAB — BASIC METABOLIC PANEL WITH GFR
BUN: 16 mg/dL (ref 7–25)
CO2: 24 mmol/L (ref 20–32)
Calcium: 9.7 mg/dL (ref 8.6–10.3)
Chloride: 106 mmol/L (ref 98–110)
Creat: 1.14 mg/dL (ref 0.70–1.35)
Glucose, Bld: 90 mg/dL (ref 65–139)
Potassium: 4.5 mmol/L (ref 3.5–5.3)
Sodium: 140 mmol/L (ref 135–146)
eGFR: 70 mL/min/{1.73_m2} (ref >=60)

## 2021-04-10 ENCOUNTER — Other Ambulatory Visit: Payer: Self-pay | Admitting: Family Medicine

## 2021-04-10 DIAGNOSIS — I10 Essential (primary) hypertension: Secondary | ICD-10-CM

## 2021-04-17 ENCOUNTER — Ambulatory Visit (INDEPENDENT_AMBULATORY_CARE_PROVIDER_SITE_OTHER): Payer: Medicare Other | Admitting: Family Medicine

## 2021-04-17 ENCOUNTER — Other Ambulatory Visit: Payer: Self-pay

## 2021-04-17 ENCOUNTER — Encounter: Payer: Self-pay | Admitting: Family Medicine

## 2021-04-17 VITALS — BP 113/73 | HR 83 | Ht 70.0 in | Wt 225.0 lb

## 2021-04-17 DIAGNOSIS — L819 Disorder of pigmentation, unspecified: Secondary | ICD-10-CM | POA: Diagnosis not present

## 2021-04-17 MED ORDER — NITROGLYCERIN 0.4 MG SL SUBL: 0.4000 mg | SUBLINGUAL_TABLET | SUBLINGUAL | 1 refills | Status: DC | PRN

## 2021-04-17 NOTE — Patient Instructions (Signed)
Keep covered until tomorrow afternoon if possible.  At that point okay to remove the bandage please keep covered in the shower with a waterproof bandage for the next 3 days.  After that okay to get wet in the shower just pat dry afterwards.  They want to keep a loose Band-Aid over it just to keep it from catching on your clothing.

## 2021-04-17 NOTE — Progress Notes (Signed)
Pt has a skin lesion on his R calf

## 2021-04-17 NOTE — Progress Notes (Addendum)
Established Patient Office Visit  Subjective:  Patient ID: Joshua Warner, male    DOB: [birth year]  Age: 70 y.o. MRN: [id number]  CC:  Chief Complaint  Patient presents with   skin lesion    HPI Male Minish presents for lesion to be removed off his posterior right calf.  It shown to me when he was here about 4 weeks ago and I was concerned by the appearance.  No pain or bleeding or irritation.  Past Medical History:  Diagnosis Date   Arthritis    CHF (congestive heart failure) (HCC)    stents   Coronary artery disease    stents   GERD (gastroesophageal reflux disease)    Hyperlipidemia    Hypertension since 2007   Kidney stone 2022   Kidney stones 2009   Myocardial infarction ([place]) 12/2015   Oxygen deficiency    only during heartattack    Past Surgical History:  Procedure Laterality Date   CORONARY ANGIOPLASTY WITH STENT PLACEMENT  12/2015   KNEE ARTHROSCOPY  12/31/2010   Right knee    TONSILLECTOMY  1957    Family History  Problem Relation Age of Onset   Heart disease Father    Hypertension Mother    Prostate cancer Maternal Uncle        Deceased   Depression Maternal Uncle    Cancer Paternal Grandfather    Cancer Paternal Grandmother    Colon cancer Neg Hx    Stomach cancer Neg Hx    Colon polyps Neg Hx    Esophageal cancer Neg Hx     Social History   Socioeconomic History   Marital status: Married    Spouse name: Peggy   Number of children: 2   Years of education: 15   Highest education level: Some college, no degree  Occupational History   Occupation: Retired    Fish farm manager: [employer]  Tobacco Use   Smoking status: Never   Smokeless tobacco: Never  Vaping Use   Vaping Use: Never used  Substance and Sexual Activity   Alcohol use: Yes    Alcohol/week: 1.0 standard drink    Types: 1 Glasses of wine per week    Comment: occasionally wine   Drug use: Never   Sexual activity: Not Currently    Partners: Female    Comment: route sales  for [employer], HS degree, 5 yrs college, married, 2 adult  kids, doesn't exercise regularly, 2-3 caffeinated drinks per day.  Other Topics Concern   Not on file  Social History Narrative   Lives with his wife. He enjoys reading, watching t.v. and going to the gym.   Social Determinants of Health   Financial Resource Strain: Low Risk    Difficulty of Paying Living Expenses: Not hard at all  Food Insecurity: No Food Insecurity   Worried About Charity fundraiser in the Last Year: Never true   [place] in the Last Year: Never true  Transportation Needs: No Transportation Needs   Lack of Transportation (Medical): No   Lack of Transportation (Non-Medical): No  Physical Activity: Insufficiently Active   Days of Exercise per Week: 3 days   Minutes of Exercise per Session: 40 min  Stress: No Stress Concern Present   Feeling of Stress : Not at all  Social Connections: Socially Integrated   Frequency of Communication with Friends and Family: More than three times a week   Frequency of Social Gatherings with Friends and Family: Never  Attends Religious Services: More than 4 times per year   Active Member of Clubs or Organizations: Yes   Attends Music therapist: More than 4 times per year   Marital Status: Married  Human resources officer Violence: Not At Risk   Fear of Current or Ex-Partner: No   Emotionally Abused: No   Physically Abused: No   Sexually Abused: No    Outpatient Medications Prior to Visit  Medication Sig Dispense Refill   ASPIRIN ADULT PO Take 81 mg by mouth.      atorvastatin (LIPITOR) 80 MG tablet Take 80 mg by mouth at bedtime.     b complex vitamins tablet Take 1 tablet by mouth daily.     Cholecalciferol 125 MCG (5000 UT) capsule Take 5,000 Units by mouth daily.     fluorouracil (EFUDEX) 5 % cream Apply topically 2 (two) times daily. X 4 weeks to skin lesions on forehead and temple on the left side. Do not get near eye. 40 g 0    Glucosamine-Chondroit-Vit C-Mn (GLUCOSAMINE CHONDR 1500 COMPLX) CAPS Take by mouth daily.     loratadine (CLARITIN) 10 MG tablet Take 10 mg by mouth daily.     losartan (COZAAR) 100 MG tablet TAKE 1 TABLET BY MOUTH EVERY DAY 90 tablet 1   metoprolol tartrate (LOPRESSOR) 25 MG tablet Take 12.5 mg by mouth 2 (two) times daily.     Multiple Vitamin (MULTIVITAMIN) tablet Take 1 tablet by mouth daily.     pantoprazole (PROTONIX) 40 MG tablet TAKE 1 TABLET BY MOUTH EVERY DAY 90 tablet 3   vitamin C (ASCORBIC ACID) 500 MG tablet Take 1,000 mg by mouth daily.     nitroGLYCERIN (NITROSTAT) 0.4 MG SL tablet PLACE 1 TABLET (0.4 MG TOTAL) UNDER THE TONGUE EVERY 5 (FIVE) MINUTES AS NEEDED. 25 tablet 1   No facility-administered medications prior to visit.    Allergies  Allergen Reactions   Celebrex [Celecoxib]     REACTION: rash REACTION: rash   Sulfa Antibiotics     Other reaction(s): Unknown   Sulfonamide Derivatives     REACTION: hives   Lisinopril Other (See Comments)    Cough   Tamsulosin Itching and Rash    ROS Review of Systems    Objective:    Physical Exam  BP 113/73   Pulse 83   Ht '5\' 10"'  (1.778 m)   Wt 225 lb (102.1 kg)   SpO2 97%   BMI 32.28 kg/m  Wt Readings from Last 3 Encounters:  04/17/21 225 lb (102.1 kg)  03/20/21 228 lb (103.4 kg)  09/15/20 229 lb (103.9 kg)     Health Maintenance Due  Topic Date Due   COVID-19 Vaccine (3 - Moderna risk series) 01/29/2021    There are no preventive care reminders to display for this patient.  Lab Results  Component Value Date   TSH 1.05 09/05/2018   Lab Results  Component Value Date   WBC 7.2 03/17/2021   HGB 17.3 (H) 03/17/2021   HCT 51.3 (H) 03/17/2021   MCV 91.8 03/17/2021   PLT 210 03/17/2021   Lab Results  Component Value Date   NA 140 03/30/2021   K 4.5 03/30/2021   CO2 24 03/30/2021   GLUCOSE 90 03/30/2021   BUN 16 03/30/2021   CREATININE 1.14 03/30/2021   BILITOT 0.5 03/17/2021   ALKPHOS 61  08/20/2016   AST 26 03/17/2021   ALT 24 03/17/2021   PROT 6.5 03/17/2021   ALBUMIN 4.1 08/20/2016  CALCIUM 9.7 03/30/2021   EGFR 70 03/30/2021   Lab Results  Component Value Date   CHOL 116 09/26/2020   Lab Results  Component Value Date   HDL 44 09/26/2020   Lab Results  Component Value Date   LDLCALC 57 09/26/2020   Lab Results  Component Value Date   TRIG 70 09/26/2020   Lab Results  Component Value Date   CHOLHDL 2.6 09/26/2020   No results found for: HGBA1C    Assessment & Plan:   Problem List Items Addressed This Visit   None Visit Diagnoses     Pigmented skin lesion of suspected malignant nature    -  Primary     Suspicious skin lesion-recommend biopsy.  For possible concern of melanoma we did do a punch biopsy, 6 mm.  Patient tolerated procedure well.  Follow-up in 12 days .for suture remover.  Follow-up wound care discussed.  Punch Biopsy Procedure Note  Pre-operative Diagnosis: Suspicious lesion  Post-operative Diagnosis: same  Locations:right  posterior calf  Indications:   Anesthesia: Lidocaine 1% without epinephrine with added sodium bicarbonate  Procedure Details  Patient informed of the risks (including bleeding and infection) and benefits of the  procedure and Verbal informed consent obtained.  The lesion and surrounding area was given a sterile prep using chlorhexidine and draped in the usual sterile fashion. The skin was then stretched perpendicular to the skin tension lines and the lesion removed using the 67m punch. The resulting ellipse was then closed. The wound was closed with 6-0 Nylon using simple interrupted stitches. Antibiotic ointment and a sterile dressing applied. The specimen was sent for pathologic examination. The patient tolerated the procedure well.  EBL: 0 ml  Findings: Await pathology   Condition: Stable  Complications: none.  Plan: 1. Instructed to keep the wound dry and covered for 24-48h and clean  thereafter. 2. Warning signs of infection were reviewed.   3. Recommended that the patient use OTC acetaminophen as needed for pain.  4. Return for suture removal in  12  days.  Meds ordered this encounter  Medications   nitroGLYCERIN (NITROSTAT) 0.4 MG SL tablet    Sig: Place 1 tablet (0.4 mg total) under the tongue every 5 (five) minutes as needed.    Dispense:  25 tablet    Refill:  1    Follow-up: Return in about 11 days (around 04/28/2021) for suture removal.    CBeatrice Lecher MD

## 2021-04-20 ENCOUNTER — Other Ambulatory Visit: Payer: Self-pay | Admitting: Family Medicine

## 2021-04-20 DIAGNOSIS — L821 Other seborrheic keratosis: Secondary | ICD-10-CM | POA: Diagnosis not present

## 2021-04-20 NOTE — Addendum Note (Signed)
Addended by: Teddy Spike on: 04/20/2021 07:54 AM   Modules accepted: Orders

## 2021-04-23 NOTE — Progress Notes (Signed)
Hi Law, biopsy just shows a pigmented seborrheic keratosis and no atypical cells which is fantastic news.  So no worrisome findings.  This is not a precancerous or cancerous lesion.

## 2021-04-28 ENCOUNTER — Ambulatory Visit (INDEPENDENT_AMBULATORY_CARE_PROVIDER_SITE_OTHER): Payer: Medicare Other | Admitting: Family Medicine

## 2021-04-28 ENCOUNTER — Encounter: Payer: Self-pay | Admitting: Family Medicine

## 2021-04-28 ENCOUNTER — Other Ambulatory Visit: Payer: Self-pay

## 2021-04-28 VITALS — BP 129/83 | HR 64 | Temp 97.8°F | Ht 70.0 in | Wt 231.0 lb

## 2021-04-28 DIAGNOSIS — Z4802 Encounter for removal of sutures: Secondary | ICD-10-CM

## 2021-04-28 DIAGNOSIS — L819 Disorder of pigmentation, unspecified: Secondary | ICD-10-CM

## 2021-04-28 NOTE — Progress Notes (Signed)
   Established Patient Office Visit  Subjective:  Patient ID: Joshua Warner, male    DOB: 05/17/1951  Age: 70 y.o. MRN: [id number]  CC:  Chief Complaint  Patient presents with   Suture / Staple Removal    Right anterior calf    HPI Joshua Warner presents for suture removal.  He says he is doing well he has been wearing mostly a waterproof Band-Aid over it has noticed a little bit of drainage.  His mother-in-law who is also my patient who lives with him is actively in hospice care he she just came from from the hospital yesterday and is expected to live between 1 to 3 days.  ROS Review of Systems    Objective:    Physical Exam  BP 129/83   Pulse 64   Temp 97.8 F (36.6 C)   Ht 5\' 10"  (1.778 m)   Wt 231 lb (104.8 kg)   SpO2 95%   BMI 33.15 kg/m     Assessment & Plan:   Problem List Items Addressed This Visit   None Visit Diagnoses     Pigmented skin lesion of suspected malignant nature    -  Primary      Biopsy results were benign for seborrheic keratosis.  Suture easily removed.  Patient tolerated well.  He does have a little scab right next to the area.  No sign of infection or induration.  Recommend apply Vaseline 2-3 times a day for the next week.  Okay to keep loosely covered with a bandage if needed.  No orders of the defined types were placed in this encounter.   Follow-up: No follow-ups on file.    Beatrice Lecher, MD

## 2021-06-16 DIAGNOSIS — M25551 Pain in right hip: Secondary | ICD-10-CM | POA: Diagnosis not present

## 2021-06-16 DIAGNOSIS — M9903 Segmental and somatic dysfunction of lumbar region: Secondary | ICD-10-CM | POA: Diagnosis not present

## 2021-06-16 DIAGNOSIS — M5417 Radiculopathy, lumbosacral region: Secondary | ICD-10-CM | POA: Diagnosis not present

## 2021-06-16 DIAGNOSIS — M9901 Segmental and somatic dysfunction of cervical region: Secondary | ICD-10-CM | POA: Diagnosis not present

## 2021-07-15 DIAGNOSIS — M542 Cervicalgia: Secondary | ICD-10-CM | POA: Diagnosis not present

## 2021-07-15 DIAGNOSIS — M9903 Segmental and somatic dysfunction of lumbar region: Secondary | ICD-10-CM | POA: Diagnosis not present

## 2021-07-15 DIAGNOSIS — M5451 Vertebrogenic low back pain: Secondary | ICD-10-CM | POA: Diagnosis not present

## 2021-07-15 DIAGNOSIS — M9901 Segmental and somatic dysfunction of cervical region: Secondary | ICD-10-CM | POA: Diagnosis not present

## 2021-07-17 DIAGNOSIS — M9903 Segmental and somatic dysfunction of lumbar region: Secondary | ICD-10-CM | POA: Diagnosis not present

## 2021-07-17 DIAGNOSIS — M5451 Vertebrogenic low back pain: Secondary | ICD-10-CM | POA: Diagnosis not present

## 2021-07-17 DIAGNOSIS — M542 Cervicalgia: Secondary | ICD-10-CM | POA: Diagnosis not present

## 2021-07-17 DIAGNOSIS — M9901 Segmental and somatic dysfunction of cervical region: Secondary | ICD-10-CM | POA: Diagnosis not present

## 2021-08-23 ENCOUNTER — Other Ambulatory Visit: Payer: Self-pay | Admitting: Gastroenterology

## 2021-09-09 DIAGNOSIS — M9901 Segmental and somatic dysfunction of cervical region: Secondary | ICD-10-CM | POA: Diagnosis not present

## 2021-09-09 DIAGNOSIS — M5451 Vertebrogenic low back pain: Secondary | ICD-10-CM | POA: Diagnosis not present

## 2021-09-09 DIAGNOSIS — M542 Cervicalgia: Secondary | ICD-10-CM | POA: Diagnosis not present

## 2021-09-09 DIAGNOSIS — M9903 Segmental and somatic dysfunction of lumbar region: Secondary | ICD-10-CM | POA: Diagnosis not present

## 2021-09-24 ENCOUNTER — Encounter (HOSPITAL_COMMUNITY): Payer: Self-pay | Admitting: Radiology

## 2021-10-03 ENCOUNTER — Other Ambulatory Visit: Payer: Self-pay | Admitting: Family Medicine

## 2021-10-03 DIAGNOSIS — I10 Essential (primary) hypertension: Secondary | ICD-10-CM

## 2021-10-19 ENCOUNTER — Other Ambulatory Visit: Payer: Self-pay | Admitting: *Deleted

## 2021-10-19 DIAGNOSIS — E785 Hyperlipidemia, unspecified: Secondary | ICD-10-CM

## 2021-10-19 DIAGNOSIS — I10 Essential (primary) hypertension: Secondary | ICD-10-CM

## 2021-10-19 DIAGNOSIS — Z125 Encounter for screening for malignant neoplasm of prostate: Secondary | ICD-10-CM

## 2021-10-20 DIAGNOSIS — I10 Essential (primary) hypertension: Secondary | ICD-10-CM | POA: Diagnosis not present

## 2021-10-20 DIAGNOSIS — Z125 Encounter for screening for malignant neoplasm of prostate: Secondary | ICD-10-CM | POA: Diagnosis not present

## 2021-10-20 DIAGNOSIS — E785 Hyperlipidemia, unspecified: Secondary | ICD-10-CM | POA: Diagnosis not present

## 2021-10-20 LAB — COMPLETE METABOLIC PANEL WITH GFR
AG Ratio: 2 (calc) (ref 1.0–2.5)
ALT: 29 U/L (ref 9–46)
AST: 31 U/L (ref 10–35)
Albumin: 4.3 g/dL (ref 3.6–5.1)
Alkaline phosphatase (APISO): 75 U/L (ref 35–144)
BUN: 20 mg/dL (ref 7–25)
CO2: 29 mmol/L (ref 20–32)
Calcium: 9.5 mg/dL (ref 8.6–10.3)
Chloride: 102 mmol/L (ref 98–110)
Creat: 1.15 mg/dL (ref 0.70–1.35)
Globulin: 2.1 g/dL (calc) (ref 1.9–3.7)
Glucose, Bld: 94 mg/dL (ref 65–99)
Potassium: 4.8 mmol/L (ref 3.5–5.3)
Sodium: 138 mmol/L (ref 135–146)
Total Bilirubin: 0.9 mg/dL (ref 0.2–1.2)
Total Protein: 6.4 g/dL (ref 6.1–8.1)
eGFR: 69 mL/min/{1.73_m2} (ref >=60)

## 2021-10-20 LAB — LIPID PANEL
Cholesterol: 109 mg/dL (ref <200)
HDL: 43 mg/dL (ref >=40)
LDL Cholesterol (Calc): 50 mg/dL (calc)
Non-HDL Cholesterol (Calc): 66 mg/dL (calc) (ref <130)
Total CHOL/HDL Ratio: 2.5 (calc) (ref <5.0)
Triglycerides: 75 mg/dL (ref <150)

## 2021-10-20 LAB — PSA: PSA: 0.8 ng/mL (ref <=4.00)

## 2021-10-21 NOTE — Progress Notes (Signed)
Hi Joshua Warner, your metabolic panel looks great.  Cholesterol and prostate test are normal.

## 2021-10-27 ENCOUNTER — Ambulatory Visit (INDEPENDENT_AMBULATORY_CARE_PROVIDER_SITE_OTHER): Payer: Medicare Other | Admitting: Family Medicine

## 2021-10-27 ENCOUNTER — Encounter: Payer: Self-pay | Admitting: Family Medicine

## 2021-10-27 VITALS — BP 124/73 | HR 60 | Resp 18 | Ht 70.0 in | Wt 217.0 lb

## 2021-10-27 DIAGNOSIS — E785 Hyperlipidemia, unspecified: Secondary | ICD-10-CM | POA: Diagnosis not present

## 2021-10-27 DIAGNOSIS — Z125 Encounter for screening for malignant neoplasm of prostate: Secondary | ICD-10-CM

## 2021-10-27 DIAGNOSIS — I10 Essential (primary) hypertension: Secondary | ICD-10-CM | POA: Diagnosis not present

## 2021-10-27 DIAGNOSIS — Z6831 Body mass index (BMI) 31.0-31.9, adult: Secondary | ICD-10-CM | POA: Diagnosis not present

## 2021-10-27 NOTE — Assessment & Plan Note (Signed)
Well controlled. Continue current regimen. Follow up in  6 mo  

## 2021-10-27 NOTE — Assessment & Plan Note (Signed)
LDL at goal.  Has cardiology in July or August this year. ?

## 2021-10-27 NOTE — Progress Notes (Signed)
? ?  Established Patient Office Visit ? ?Subjective   ?Patient ID: Joshua Warner, male    DOB: 1951/04/13  Age: 71 y.o. MRN: [id number] ? ?Chief Complaint  ?Patient presents with  ? Hypertension  ?  Follow up   ? ? ?HPI ? ?Hypertension- Pt denies chest pain, SOB, dizziness, or heart palpitations.  Taking meds as directed w/o problems.  Denies medication side effects.   ? ?He is doing well overall.  He does follow-up with cardio once a year usually in the summertime.  He says he is purposely been trying to lose weight.  He has been doing a Slim fast shake for lunch and eating a sensible breakfast and dinner.  He is also been going to the gym or either exercising 5 to 6 days a week.  He is actually been able to lose some weight so far and is feeling better.  In fact he is down about 16 pounds ? ? ? ?ROS ? ?  ?Objective:  ?  ? ?BP 124/73   Pulse 60   Resp 18   Ht '5\' 10"'$  (1.778 m)   Wt 217 lb (98.4 kg)   SpO2 95%   BMI 31.14 kg/m?  ? ? ?Physical Exam ?Constitutional:   ?   Appearance: He is well-developed.  ?HENT:  ?   Head: Normocephalic and atraumatic.  ?Cardiovascular:  ?   Rate and Rhythm: Normal rate and regular rhythm.  ?   Heart sounds: Normal heart sounds.  ?Pulmonary:  ?   Effort: Pulmonary effort is normal.  ?   Breath sounds: Normal breath sounds.  ?Skin: ?   General: Skin is warm and dry.  ?Neurological:  ?   Mental Status: He is alert and oriented to person, place, and time.  ?Psychiatric:     ?   Behavior: Behavior normal.  ? ? ? ?No results found for any visits on 10/27/21. ? ? ? ?The ASCVD Risk score (Arnett DK, et al., 2019) failed to calculate for the following reasons: ?  The patient has a prior MI or stroke diagnosis ? ?  ?Assessment & Plan:  ? ?Problem List Items Addressed This Visit   ? ?  ? Cardiovascular and Mediastinum  ? HYPERTENSION, BENIGN - Primary  ?  Well controlled. Continue current regimen. Follow up in  6 mo  ? ?  ?  ?  ? Other  ? Hyperlipidemia  ?  LDL at goal.  Has cardiology in  July or August this year. ? ?  ?  ? ?Other Visit Diagnoses   ? ? Screening for prostate cancer      ? BMI 31.0-31.9,adult      ? ?  ? ? ?BMI 31-overall he is doing well with diet changes exercise and weight loss.  Just encouraged him to keep it up. ? ?Return in about 6 months (around 04/29/2022) for Hypertension and labs .  ? ? ?Beatrice Lecher, MD ? ?

## 2021-11-11 DIAGNOSIS — M9903 Segmental and somatic dysfunction of lumbar region: Secondary | ICD-10-CM | POA: Diagnosis not present

## 2021-11-11 DIAGNOSIS — M9901 Segmental and somatic dysfunction of cervical region: Secondary | ICD-10-CM | POA: Diagnosis not present

## 2021-11-11 DIAGNOSIS — M542 Cervicalgia: Secondary | ICD-10-CM | POA: Diagnosis not present

## 2021-11-11 DIAGNOSIS — M5451 Vertebrogenic low back pain: Secondary | ICD-10-CM | POA: Diagnosis not present

## 2021-11-20 ENCOUNTER — Other Ambulatory Visit: Payer: Self-pay | Admitting: Gastroenterology

## 2021-12-14 DIAGNOSIS — M5451 Vertebrogenic low back pain: Secondary | ICD-10-CM | POA: Diagnosis not present

## 2021-12-14 DIAGNOSIS — M9901 Segmental and somatic dysfunction of cervical region: Secondary | ICD-10-CM | POA: Diagnosis not present

## 2021-12-14 DIAGNOSIS — M542 Cervicalgia: Secondary | ICD-10-CM | POA: Diagnosis not present

## 2021-12-14 DIAGNOSIS — M9903 Segmental and somatic dysfunction of lumbar region: Secondary | ICD-10-CM | POA: Diagnosis not present

## 2021-12-23 IMAGING — CT CT RENAL STONE PROTOCOL
2 of 4 series · 14 of 46 positions shown, 16 images · non-contrast
Comparison: CT chest 10/29/2019

CLINICAL DATA: Flank pain, kidney stone suspected Hematuria,
unknown cause

EXAM:
CT ABDOMEN AND PELVIS WITHOUT CONTRAST
TECHNIQUE: Multidetector CT imaging of the abdomen and pelvis was performed
following the standard protocol without IV contrast.

[Series 3: axial st · axial · 0.84mm/px · z∈[+737,+1237]mm · 11 of 115 slices shown, 13 images]
[im 10/115  soft-tissue]
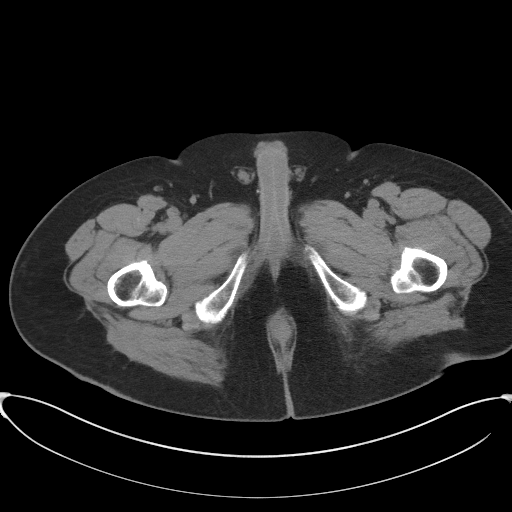
[im 10/115  bone]
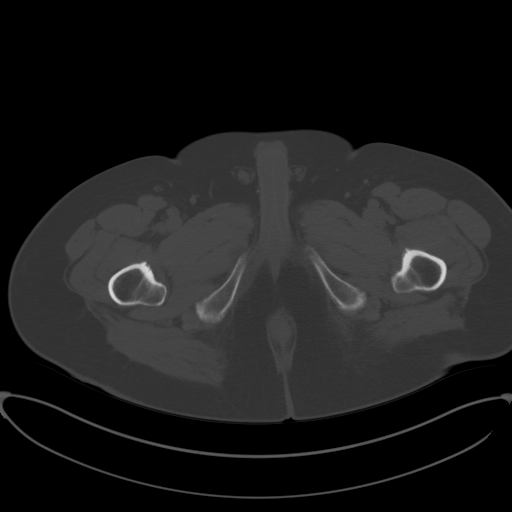
[im 20/115  soft-tissue]
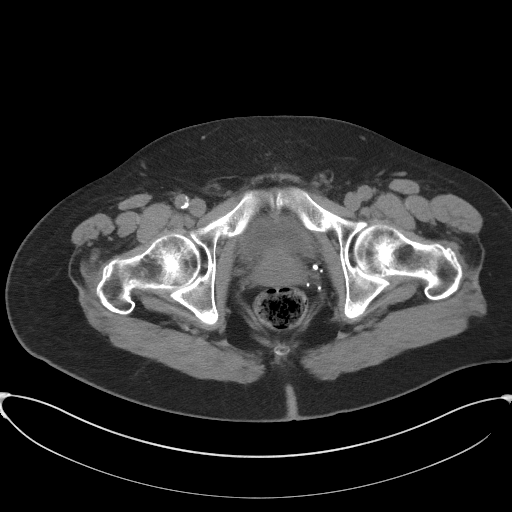
[im 30/115  soft-tissue]
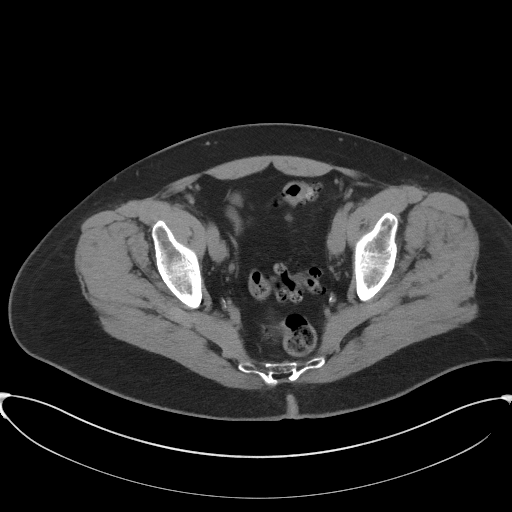
[im 40/115  soft-tissue]
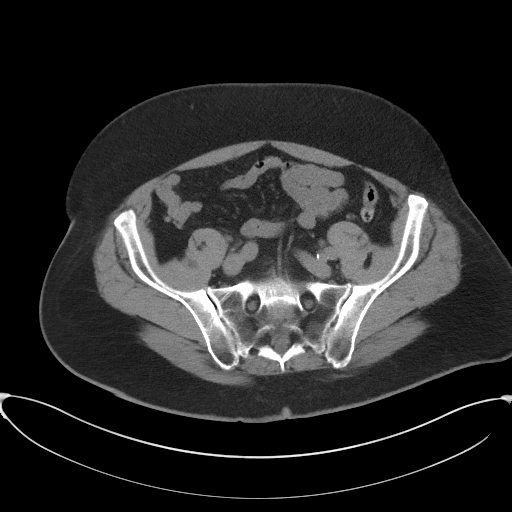
[im 50/115  soft-tissue]
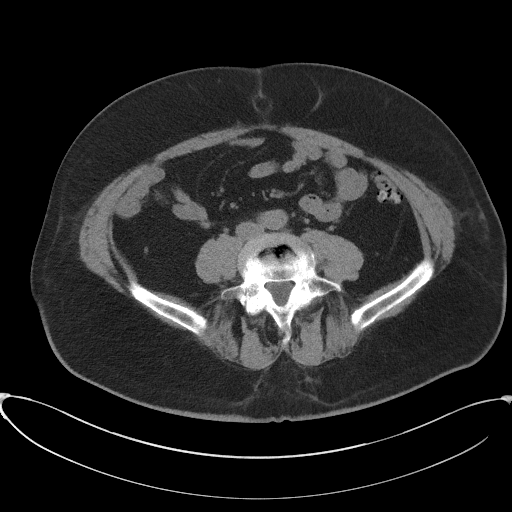
[im 60/115  soft-tissue]
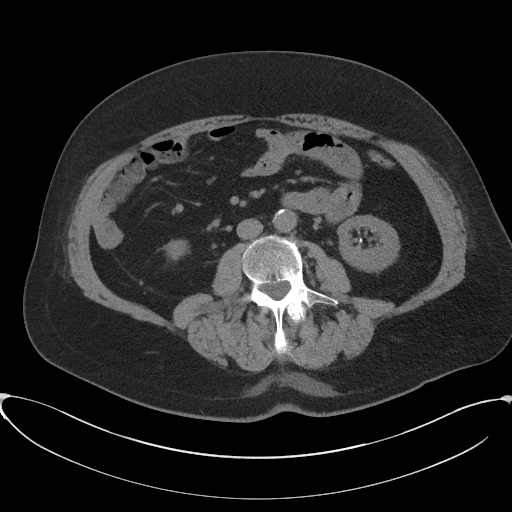
[im 70/115  soft-tissue]
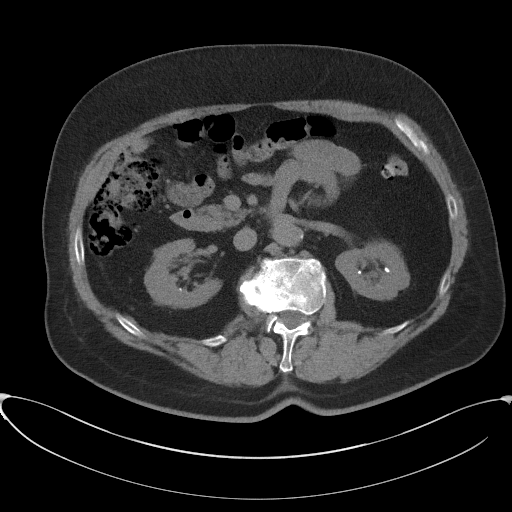
[im 80/115  soft-tissue]
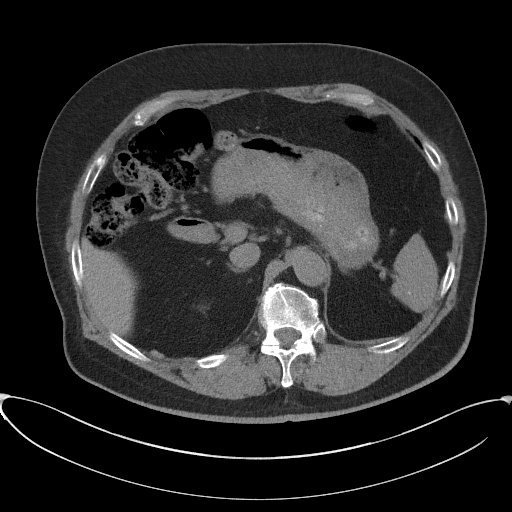
[im 90/115  soft-tissue]
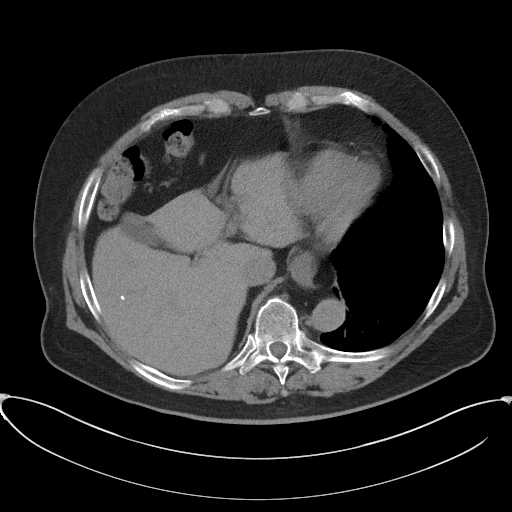
[im 90/115  bone]
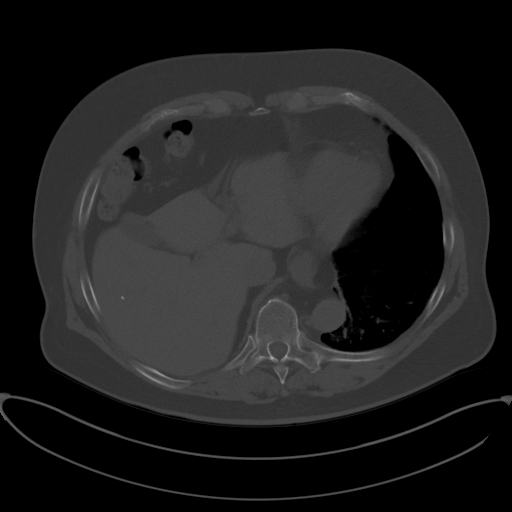
[im 100/115  soft-tissue]
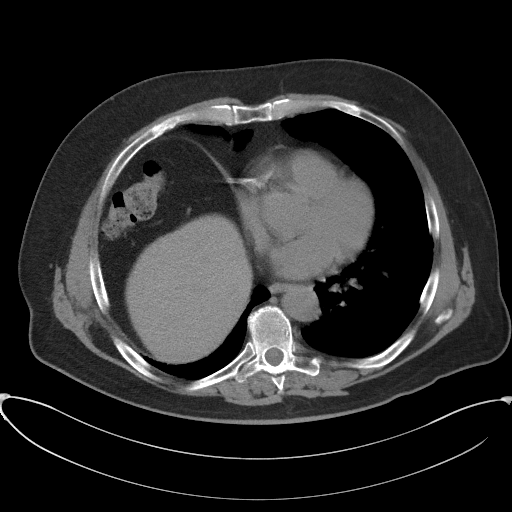
[im 110/115  soft-tissue]
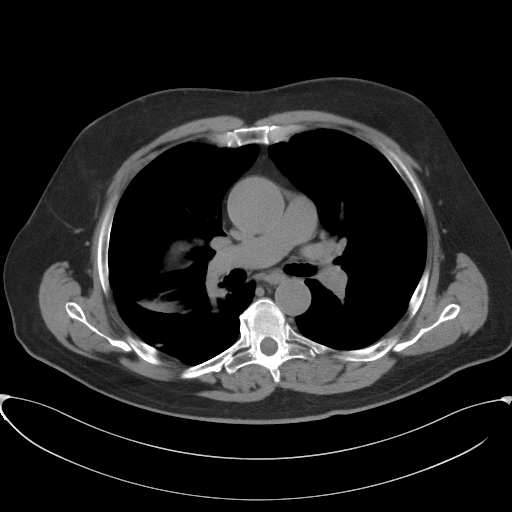

[Series 5: coronal st · coronal · 0.73mm/px · 3 of 93 slices shown]
[im 31/93  soft-tissue]
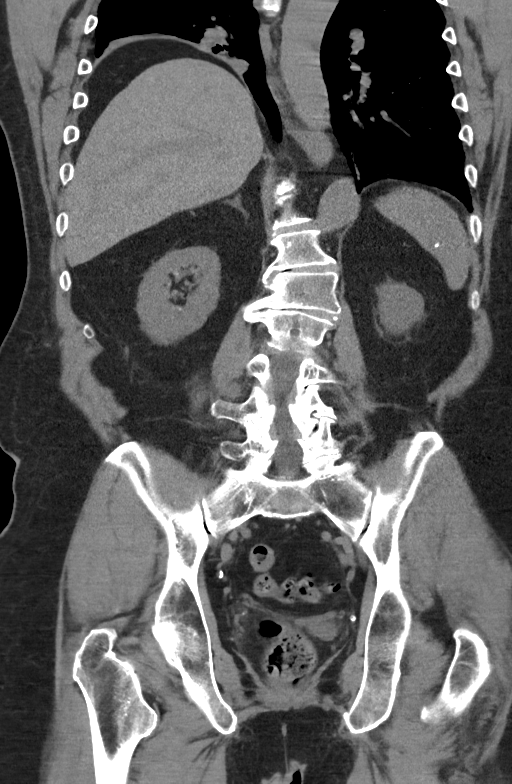
[im 41/93  soft-tissue]
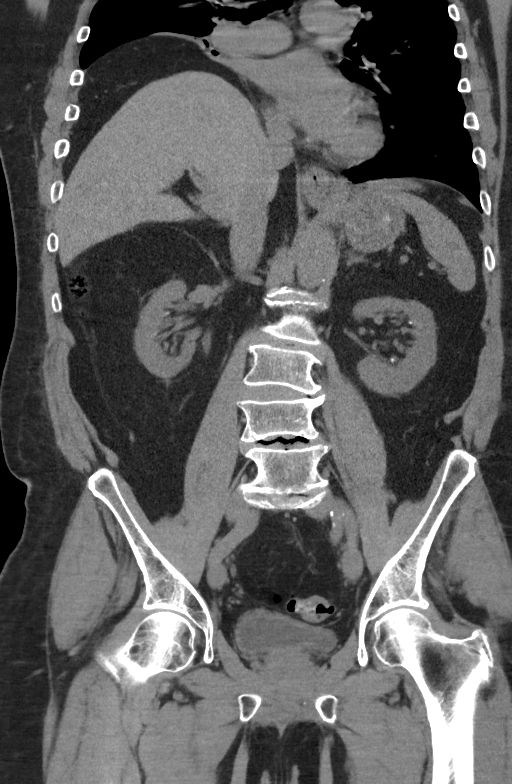
[im 52/93  soft-tissue]
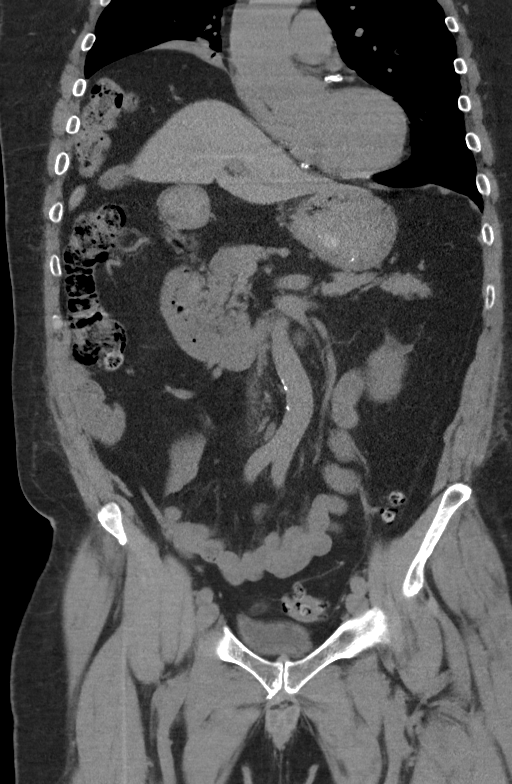

[14 of 46 positions shown; findings below may reference images not displayed]

FINDINGS: Lower chest: Persistent elevation of the right hemidiaphragm with
chronic scarring/atelectasis in the right lung base. Visualized lung
bases are otherwise clear. Chronic calcified left hilar lymph nodes.
Heart size is normal. Coronary artery calcification is present.
Ascending thoracic aorta measures 4.6 cm in diameter, unchanged from
prior.

Hepatobiliary: There are a few scattered calcified granulomas within
the liver. Otherwise unremarkable unenhanced appearance of the
liver. No focal liver lesion identified. Gallbladder within normal
limits. No hyperdense gallstone. No biliary dilatation.

Pancreas: Unremarkable. No pancreatic ductal dilatation or
surrounding inflammatory changes.

Spleen: Scattered calcified granulomas throughout the spleen. Spleen
appears otherwise unremarkable.

Adrenals/Urinary Tract: Unremarkable adrenal glands. Numerous
punctate 2-3 mm bilateral renal calculi. There is a 3 mm stone
located within the bladder at or just beyond the level of the right
ureterovesical junction (series 3, image 93). There is no dilation
of the right ureter. No hydronephrosis. Left ureter is unremarkable.
Urinary bladder is unremarkable for the degree of distension.

Stomach/Bowel: Small hiatal hernia. Stomach otherwise unremarkable.
No dilated loops of bowel. Sigmoid diverticulosis. No focal bowel
wall thickening or inflammatory changes.

Vascular/Lymphatic: Scattered aortoiliac atherosclerotic
calcifications without aneurysm. No abdominopelvic lymphadenopathy.

Reproductive: Prostate is unremarkable.

Other: Incidentally noted 3.1 x 1.6 cm area of encapsulated fat
density within the anterior pelvis adjacent to the sigmoid colon
which may reflect a prior area of fat necrosis (series 3, image 89).
No inflammatory stranding adjacent to this structure. No free fluid.
No abdominopelvic fluid collections. No pneumoperitoneum. No
abdominal wall hernia.

Musculoskeletal: No acute or significant osseous findings.
Thoracolumbar scoliotic curvature with multilevel lumbar
degenerative disc disease.
IMPRESSION: 1. Nonobstructing 3 mm stone located within the bladder at or just
beyond the level of the right ureterovesical junction. No associated
hydronephrosis or hydroureter.
2. Numerous punctate 2-3 mm bilateral renal calculi.
3. Sigmoid diverticulosis without evidence of acute diverticulitis.
4. Ascending thoracic aorta measures 4.6 cm in diameter, unchanged
from prior. Ascending thoracic aortic aneurysm. Recommend
semi-annual imaging followup by CTA or MRA and referral to
cardiothoracic surgery if not already obtained. This recommendation
follows 2313 ACCF/AHA/AATS/ACR/ASA/SCA/ARISTOTILES/JIM/MUCA/KIEFER Guidelines
for the Diagnosis and Management of Patients With Thoracic Aortic
Disease. Circulation. 2313; 121: E266-e369. Aortic aneurysm NOS
(BQW8P-LMG.I)

Aortic Atherosclerosis (BQW8P-0LK.K).

## 2022-01-01 ENCOUNTER — Other Ambulatory Visit: Payer: Self-pay | Admitting: Family Medicine

## 2022-01-01 DIAGNOSIS — I10 Essential (primary) hypertension: Secondary | ICD-10-CM

## 2022-01-04 DIAGNOSIS — M5451 Vertebrogenic low back pain: Secondary | ICD-10-CM | POA: Diagnosis not present

## 2022-01-04 DIAGNOSIS — M9901 Segmental and somatic dysfunction of cervical region: Secondary | ICD-10-CM | POA: Diagnosis not present

## 2022-01-04 DIAGNOSIS — M9903 Segmental and somatic dysfunction of lumbar region: Secondary | ICD-10-CM | POA: Diagnosis not present

## 2022-01-04 DIAGNOSIS — M542 Cervicalgia: Secondary | ICD-10-CM | POA: Diagnosis not present

## 2022-01-25 ENCOUNTER — Telehealth: Payer: Self-pay

## 2022-01-25 DIAGNOSIS — M5451 Vertebrogenic low back pain: Secondary | ICD-10-CM | POA: Diagnosis not present

## 2022-01-25 DIAGNOSIS — M9903 Segmental and somatic dysfunction of lumbar region: Secondary | ICD-10-CM | POA: Diagnosis not present

## 2022-01-25 DIAGNOSIS — M542 Cervicalgia: Secondary | ICD-10-CM | POA: Diagnosis not present

## 2022-01-25 DIAGNOSIS — M19071 Primary osteoarthritis, right ankle and foot: Secondary | ICD-10-CM

## 2022-01-25 DIAGNOSIS — M9901 Segmental and somatic dysfunction of cervical region: Secondary | ICD-10-CM | POA: Diagnosis not present

## 2022-01-25 NOTE — Telephone Encounter (Signed)
Patient called requesting a referral for orthotics. He stated that his Chiropracter wanted him to get some new ones.

## 2022-01-26 NOTE — Telephone Encounter (Signed)
Referral placed to Dr. Raeford Razor

## 2022-01-29 ENCOUNTER — Ambulatory Visit (INDEPENDENT_AMBULATORY_CARE_PROVIDER_SITE_OTHER): Payer: Medicare Other | Admitting: Sports Medicine

## 2022-01-29 DIAGNOSIS — M7062 Trochanteric bursitis, left hip: Secondary | ICD-10-CM | POA: Diagnosis not present

## 2022-01-29 NOTE — Progress Notes (Signed)
    Procedures performed today:    None.  Independent interpretation of notes and tests performed by another provider:   None.  Brief History, Exam, Impression, and Recommendations:    Greater trochanteric bursitis, left Pleasant 71 year old male,Increasing pain left lateral hip, tender over the greater trochanter. We will start conservatively, greater trochanteric bursa conditioning, return to see me in 6 weeks, injection if not better. He would also like some custom molded orthotics so I will set him up with Dr. Raeford Razor.    ____________________________________________ Gwen Her. Dianah Field, M.D., ABFM., CAQSM., AME. Primary Care and Sports Medicine [facility], [place]  Adjunct Professor of [employer]  Risk manager

## 2022-01-29 NOTE — Assessment & Plan Note (Addendum)
Pleasant 71 year old male,Increasing pain left lateral hip, tender over the greater trochanter. We will start conservatively, greater trochanteric bursa conditioning, return to see me in 6 weeks, injection if not better. He would also like some custom molded orthotics so I will set him up with Dr. Raeford Razor.

## 2022-02-04 ENCOUNTER — Ambulatory Visit (INDEPENDENT_AMBULATORY_CARE_PROVIDER_SITE_OTHER): Payer: Medicare Other | Admitting: Family Medicine

## 2022-02-04 ENCOUNTER — Encounter: Payer: Self-pay | Admitting: Family Medicine

## 2022-02-04 DIAGNOSIS — M19071 Primary osteoarthritis, right ankle and foot: Secondary | ICD-10-CM | POA: Diagnosis not present

## 2022-02-04 NOTE — Progress Notes (Signed)
  Joshua Warner - 71 y.o. male MRN [id number]  Date of birth: 1951/02/10  SUBJECTIVE:  Including CC & ROS.  No chief complaint on file.   Joshua Warner is a 71 y.o. male that is  here for acute on chronic feet pain. Pain occurring with walking. History of surgery on the right foot. Did well with orthotics in the past.    Review of Systems See HPI   HISTORY: Past Medical, Surgical, Social, and Family History Reviewed & Updated per EMR.   Pertinent Historical Findings include:  Past Medical History:  Diagnosis Date   Arthritis    CHF (congestive heart failure) ([place])    stents   Coronary artery disease    stents   GERD (gastroesophageal reflux disease)    Hyperlipidemia    Hypertension since 2007   Kidney stone 2022   Kidney stones 2009   Myocardial infarction ([place]) 12/2015   Oxygen deficiency    only during heartattack    Past Surgical History:  Procedure Laterality Date   CORONARY ANGIOPLASTY WITH STENT PLACEMENT  12/2015   KNEE ARTHROSCOPY  12/31/2010   Right knee    TONSILLECTOMY  1957     PHYSICAL EXAM:  VS: There were no vitals taken for this visit. Physical Exam Gen: NAD, alert, cooperative with exam, well-appearing MSK:  Neurovascularly intact    Patient was fitted for a standard, cushioned, semi-rigid orthotic. The orthotic was heated and afterward the patient stood on the orthotic blank positioned on the orthotic stand. The patient was positioned in subtalar neutral position and 10 degrees of ankle dorsiflexion in a weight bearing stance. After completion of molding, a stable base was applied to the orthotic blank. The blank was ground to a stable position for weight bearing. Size: 10 Pairs: 2 Base: Blue EVA Additional Posting and Padding: right first ray post The patient ambulated these, and they were very comfortable.   ASSESSMENT & PLAN:   Osteoarthritis of first metatarsophalangeal (MTP) joint of right foot Acute on chronic in nature.  Having limited range of motion from previous surgery.  - counseled on home exercise therapy and supportive care - orthotics  - could consider scaphoid pads.

## 2022-02-04 NOTE — Assessment & Plan Note (Signed)
Acute on chronic in nature. Having limited range of motion from previous surgery.  - counseled on home exercise therapy and supportive care - orthotics  - could consider scaphoid pads.

## 2022-02-22 DIAGNOSIS — M542 Cervicalgia: Secondary | ICD-10-CM | POA: Diagnosis not present

## 2022-02-22 DIAGNOSIS — M5451 Vertebrogenic low back pain: Secondary | ICD-10-CM | POA: Diagnosis not present

## 2022-02-22 DIAGNOSIS — M9901 Segmental and somatic dysfunction of cervical region: Secondary | ICD-10-CM | POA: Diagnosis not present

## 2022-02-22 DIAGNOSIS — M9903 Segmental and somatic dysfunction of lumbar region: Secondary | ICD-10-CM | POA: Diagnosis not present

## 2022-03-22 DIAGNOSIS — I2510 Atherosclerotic heart disease of native coronary artery without angina pectoris: Secondary | ICD-10-CM | POA: Diagnosis not present

## 2022-03-22 DIAGNOSIS — M542 Cervicalgia: Secondary | ICD-10-CM | POA: Diagnosis not present

## 2022-03-22 DIAGNOSIS — M5451 Vertebrogenic low back pain: Secondary | ICD-10-CM | POA: Diagnosis not present

## 2022-03-22 DIAGNOSIS — M9901 Segmental and somatic dysfunction of cervical region: Secondary | ICD-10-CM | POA: Diagnosis not present

## 2022-03-22 DIAGNOSIS — M9903 Segmental and somatic dysfunction of lumbar region: Secondary | ICD-10-CM | POA: Diagnosis not present

## 2022-04-02 ENCOUNTER — Ambulatory Visit (INDEPENDENT_AMBULATORY_CARE_PROVIDER_SITE_OTHER): Payer: Medicare Other | Admitting: Sports Medicine

## 2022-04-02 ENCOUNTER — Encounter: Payer: Self-pay | Admitting: Family Medicine

## 2022-04-02 DIAGNOSIS — Z0000 Encounter for general adult medical examination without abnormal findings: Secondary | ICD-10-CM

## 2022-04-02 NOTE — Patient Instructions (Addendum)
[place] Maintenance Summary and Written Plan of Care  [name] ,  Thank you for allowing me to perform your Medicare Annual Wellness Visit and for your ongoing commitment to your health.   Health Maintenance & Immunization History Health Maintenance  Topic Date Due  . COVID-19 Vaccine (3 - Moderna risk series) 04/18/2022 (Originally 01/29/2021)  . Zoster Vaccines- Shingrix (2 of 2) 07/03/2022 (Originally 11/09/2021)  . TETANUS/TDAP  09/12/2028  . COLONOSCOPY (Pts 45-57yr Insurance coverage will need to be confirmed)  12/09/2029  . Pneumonia Vaccine 70 Years old  Completed  . INFLUENZA VACCINE  Completed  . Hepatitis C Screening  Completed  . HPV VACCINES  Aged Out   Immunization History  Administered Date(s) Administered  . Fluad Quad(high Dose 65+) 03/13/2019, 03/17/2020, 03/20/2021  . Influenza Split 03/28/2012, 03/28/2012  . Influenza Whole 04/28/2009, 03/28/2010  . Influenza, High Dose Seasonal PF 03/04/2017  . Influenza, Seasonal, Injecte, Preservative Fre 03/22/2014, 03/28/2015, 02/20/2016  . Influenza,inj,Quad PF,6+ Mos 03/22/2014, 03/28/2015, 02/20/2016, 03/13/2018  . Influenza-Unspecified 04/19/2013, 03/16/2022  . Moderna SARS-COV2 Booster Vaccination 05/01/2020, 01/01/2021  . Moderna Sars-Covid-2 Vaccination 07/30/2019, 08/27/2019  . Pneumococcal Conjugate-13 03/04/2017  . Pneumococcal Polysaccharide-23 09/11/2018  . Td 04/28/2008  . Tdap 09/13/2018  . Zoster Recombinat (Shingrix) 09/14/2021  . Zoster, Live 12/14/2013    These are the patient goals that we discussed:  Goals Addressed              This Visit's Progress   .  Patient Stated (pt-stated)        Patient would like to loose 20 lbs.        This is a list of Health Maintenance Items that are overdue or due now: Shingrix vaccine- 2nd dose - please let uKoreaknow the date that you had this completed.   Orders/Referrals Placed Today: No orders of the defined types  were placed in this encounter.  (Contact our referral department at [phone] you have not spoken with someone about your referral appointment within the next 5 days)    Follow-up Plan Follow-up with MHali Marry MD as planned Medicare wellness visit in one year. Patient will access AVS on my chart.      Health Maintenance, Male Adopting a healthy lifestyle and getting preventive care are important in promoting health and wellness. Ask your health care provider about: The right schedule for you to have regular tests and exams. Things you can do on your own to prevent diseases and keep yourself healthy. What should I know about diet, weight, and exercise? Eat a healthy diet  Eat a diet that includes plenty of vegetables, fruits, low-fat dairy products, and lean protein. Do not eat a lot of foods that are high in solid fats, added sugars, or sodium. Maintain a healthy weight Body mass index (BMI) is a measurement that can be used to identify possible weight problems. It estimates body fat based on height and weight. Your health care provider can help determine your BMI and help you achieve or maintain a healthy weight. Get regular exercise Get regular exercise. This is one of the most important things you can do for your health. Most adults should: Exercise for at least 150 minutes each week. The exercise should increase your heart rate and make you sweat (moderate-intensity exercise). Do strengthening exercises at least twice a week. This is in addition to the moderate-intensity exercise. Spend less time sitting. Even light physical activity can be beneficial. Watch cholesterol  and blood lipids Have your blood tested for lipids and cholesterol at 70 years of age, then have this test every 5 years. You may need to have your cholesterol levels checked more often if: Your lipid or cholesterol levels are high. You are older than 70 years of age. You are at high risk for  heart disease. What should I know about cancer screening? Many types of cancers can be detected early and may often be prevented. Depending on your health history and family history, you may need to have cancer screening at various ages. This may include screening for: Colorectal cancer. Prostate cancer. Skin cancer. Lung cancer. What should I know about heart disease, diabetes, and high blood pressure? Blood pressure and heart disease High blood pressure causes heart disease and increases the risk of stroke. This is more likely to develop in people who have high blood pressure readings or are overweight. Talk with your health care provider about your target blood pressure readings. Have your blood pressure checked: Every 3-5 years if you are 16-67 years of age. Every year if you are 58 years old or older. If you are between the ages of 76 and 61 and are a current or former smoker, ask your health care provider if you should have a one-time screening for abdominal aortic aneurysm (AAA). Diabetes Have regular diabetes screenings. This checks your fasting blood sugar level. Have the screening done: Once every three years after age 30 if you are at a normal weight and have a low risk for diabetes. More often and at a younger age if you are overweight or have a high risk for diabetes. What should I know about preventing infection? Hepatitis B If you have a higher risk for hepatitis B, you should be screened for this virus. Talk with your health care provider to find out if you are at risk for hepatitis B infection. Hepatitis C Blood testing is recommended for: Everyone born from [birth year] through 1964. Anyone with known risk factors for hepatitis C. Sexually transmitted infections (STIs) You should be screened each year for STIs, including gonorrhea and chlamydia, if: You are sexually active and are younger than 70 years of age. You are older than 70 years of age and your health care provider  tells you that you are at risk for this type of infection. Your sexual activity has changed since you were last screened, and you are at increased risk for chlamydia or gonorrhea. Ask your health care provider if you are at risk. Ask your health care provider about whether you are at high risk for HIV. Your health care provider may recommend a prescription medicine to help prevent HIV infection. If you choose to take medicine to prevent HIV, you should first get tested for HIV. You should then be tested every 3 months for as long as you are taking the medicine. Follow these instructions at home: Alcohol use Do not drink alcohol if your health care provider tells you not to drink. If you drink alcohol: Limit how much you have to 0-2 drinks a day. Know how much alcohol is in your drink. In the [place] one drink equals one 12 oz bottle of beer (355 mL), one 5 oz glass of wine (148 mL), or one 1 oz glass of hard liquor (44 mL). Lifestyle Do not use any products that contain nicotine or tobacco. These products include cigarettes, chewing tobacco, and vaping devices, such as e-cigarettes. If you need help quitting, ask your health care provider.  Do not use street drugs. Do not share needles. Ask your health care provider for help if you need support or information about quitting drugs. General instructions Schedule regular health, dental, and eye exams. Stay current with your vaccines. Tell your health care provider if: You often feel depressed. You have ever been abused or do not feel safe at home. Summary Adopting a healthy lifestyle and getting preventive care are important in promoting health and wellness. Follow your health care provider's instructions about healthy diet, exercising, and getting tested or screened for diseases. Follow your health care provider's instructions on monitoring your cholesterol and blood pressure. This information is not intended to replace advice given to you by  your health care provider. Make sure you discuss any questions you have with your health care provider. Document Revised: 11/03/2020 Document Reviewed: 11/03/2020 Elsevier Patient Education  [place].

## 2022-04-02 NOTE — Progress Notes (Signed)
MEDICARE ANNUAL WELLNESS VISIT  04/02/2022  Telephone Visit Disclaimer This Medicare AWV was conducted by telephone due to national recommendations for restrictions regarding the COVID-19 Pandemic (e.g. social distancing).  I verified, using two identifiers, that I am speaking with Joshua Warner or their authorized healthcare agent. I discussed the limitations, risks, security, and privacy concerns of performing an evaluation and management service by telephone and the potential availability of an in-person appointment in the future. The patient expressed understanding and agreed to proceed.  Location of Patient: Home Location of Provider (nurse):  Provider home  Subjective:    Joshua Warner is a 71 y.o. male patient of Metheney, Rene Kocher, MD who had a Medicare Annual Wellness Visit today via telephone. Joshua Warner is Retired and lives with their family. he has 2 children. he reports that he is socially active and does interact with friends/family regularly. he is moderately physically active and enjoys reading, watching television and going to the gym.  Patient Care Team: Hali Marry, MD as PCP - General Burr Medico, MD as Referring Physician (Cardiology)     04/02/2022    8:05 AM 03/25/2021    9:09 AM 07/31/2019   10:09 AM 11/18/2016    5:45 PM 08/16/2014    9:00 AM  Advanced Directives  Does Patient Have a Medical Advance Directive? No No No Yes No  Does patient want to make changes to medical advance directive?    No - Patient declined   Would patient like information on creating a medical advance directive? No - Patient declined No - Patient declined No - Patient declined  No - patient declined information    Hospital Utilization Over the Past 12 Months: # of hospitalizations or ER visits: 0 # of surgeries: 0  Review of Systems    Patient reports that his overall health is better compared to last year.  History obtained from chart review and the  patient  Patient Reported Readings (BP, Pulse, CBG, Weight, etc) none  Pain Assessment Pain : No/denies pain     Current Medications & Allergies (verified) Allergies as of 04/02/2022       Reactions   Celebrex [celecoxib]    REACTION: rash REACTION: rash   Lisinopril Other (See Comments)   Cough   Sulfa Antibiotics    Other reaction(s): Unknown   Sulfonamide Derivatives    REACTION: hives   Tamsulosin Itching, Rash        Medication List        Accurate as of April 02, 2022  8:11 AM. If you have any questions, ask your nurse or doctor.          ascorbic acid 500 MG tablet Commonly known as: VITAMIN C Take 1,000 mg by mouth daily.   ASPIRIN ADULT PO Take 81 mg by mouth.   atorvastatin 80 MG tablet Commonly known as: LIPITOR Take 80 mg by mouth at bedtime.   b complex vitamins tablet Take 1 tablet by mouth daily.   Cholecalciferol 125 MCG (5000 UT) capsule Take 5,000 Units by mouth daily.   fluorouracil 5 % cream Commonly known as: Efudex Apply topically 2 (two) times daily. X 4 weeks to skin lesions on forehead and temple on the left side. Do not get near eye.   Glucosamine Chondr 1500 Complx Caps Take by mouth daily.   loratadine 10 MG tablet Commonly known as: CLARITIN Take 10 mg by mouth daily.   losartan 100 MG tablet Commonly known as: COZAAR  TAKE 1 TABLET BY MOUTH EVERY DAY   metoprolol tartrate 25 MG tablet Commonly known as: LOPRESSOR Take 12.5 mg by mouth 2 (two) times daily.   multivitamin tablet Take 1 tablet by mouth daily.   nitroGLYCERIN 0.4 MG SL tablet Commonly known as: NITROSTAT Place 1 tablet (0.4 mg total) under the tongue every 5 (five) minutes as needed.   pantoprazole 40 MG tablet Commonly known as: PROTONIX Take 1 tablet (40 mg total) by mouth daily. Call [phone] to schedule an office visit for more refills        History (reviewed): Past Medical History:  Diagnosis Date   Arthritis    CHF  (congestive heart failure) ([place])    stents   Coronary artery disease    stents   GERD (gastroesophageal reflux disease)    Hyperlipidemia    Hypertension since 2007   Kidney stone 2022   Kidney stones 2009   Myocardial infarction ([place]) 12/2015   Oxygen deficiency    only during heartattack   Past Surgical History:  Procedure Laterality Date   CORONARY ANGIOPLASTY WITH STENT PLACEMENT  12/2015   KNEE ARTHROSCOPY  12/31/2010   Right knee    TONSILLECTOMY  1957   Family History  Problem Relation Age of Onset   Heart disease Father    Hypertension Mother    Arthritis Mother    Prostate cancer Maternal Uncle        Deceased   Depression Maternal Uncle    Cancer Paternal Grandfather    Cancer Paternal Grandmother    Colon cancer Neg Hx    Stomach cancer Neg Hx    Colon polyps Neg Hx    Esophageal cancer Neg Hx    Social History   Socioeconomic History   Marital status: Married    Spouse name: Peggy   Number of children: 2   Years of education: 15   Highest education level: Some college, no degree  Occupational History   Occupation: Retired    Fish farm manager: [employer]  Tobacco Use   Smoking status: Never   Smokeless tobacco: Never  Vaping Use   Vaping Use: Never used  Substance and Sexual Activity   Alcohol use: Yes    Alcohol/week: 1.0 standard drink of alcohol    Types: 1 Glasses of wine per week    Comment: Less than one drink per month   Drug use: Never   Sexual activity: Not Currently    Partners: Female    Birth control/protection: None    Comment: 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  Other Topics Concern   Not on file  Social History Narrative   Lives with his wife and their daughter. He enjoys reading, watching t.v. and going to the gym.   Social Determinants of Health   Financial Resource Strain: Low Risk  (03/29/2022)   Overall Financial Resource Strain  (CARDIA)    Difficulty of Paying Living Expenses: Not hard at all  Food Insecurity: No Food Insecurity (03/29/2022)   Hunger Vital Sign    Worried About Running Out of Food in the Last Year: Never true    Ran Out of Food in the Last Year: Never true  Transportation Needs: No Transportation Needs (03/29/2022)   PRAPARE - Hydrologist (Medical): No    Lack of Transportation (Non-Medical): No  Physical Activity: Insufficiently Active (03/29/2022)   Exercise  Vital Sign    Days of Exercise per Week: 3 days    Minutes of Exercise per Session: 30 min  Stress: No Stress Concern Present (03/29/2022)   [place]    Feeling of Stress : Not at all  Social Connections: [place] (04/02/2022)   Social Connection and Isolation Panel [NHANES]    Frequency of Communication with Friends and Family: Once a week    Frequency of Social Gatherings with Friends and Family: Twice a week    Attends Religious Services: More than 4 times per year    Active Member of [employer] or Organizations: Yes    Attends Archivist Meetings: More than 4 times per year    Marital Status: Married    Activities of Daily Living    04/02/2022    8:07 AM 03/29/2022    9:15 AM  In your present state of health, do you have any difficulty performing the following activities:  Hearing?  0  Comment wears bilateral hearing aids.   Vision?  0  Difficulty concentrating or making decisions?  0  Walking or climbing stairs?  0  Dressing or bathing?  0  Doing errands, shopping?  0  Preparing Food and eating ?  N  Using the Toilet?  N  In the past six months, have you accidently leaked urine?  N  Do you have problems with loss of bowel control?  N  Managing your Medications?  N  Managing your Finances?  N  Housekeeping or managing your Housekeeping?  N    Patient Education/ Literacy How often do you need to have someone  help you when you read instructions, pamphlets, or other written materials from your doctor or pharmacy?: 1 - Never What is the last grade level you completed in school?: 3 years of college  Exercise Current Exercise Habits: Structured exercise class, Type of exercise: treadmill;walking;Other - see comments (stationary bike), Time (Minutes): 30, Frequency (Times/Week): 3, Weekly Exercise (Minutes/Week): 90, Intensity: Moderate, Exercise limited by: None identified  Diet Patient reports consuming  2  meals a day and 1 snack(s) a day Patient reports that his primary diet is: Regular Patient reports that she does have regular access to food.   Depression Screen    04/02/2022    8:06 AM 10/27/2021    2:02 PM 03/25/2021    9:09 AM 09/15/2020    2:29 PM 07/31/2019   10:10 AM 09/11/2018   10:00 AM 03/13/2018    9:47 AM  PHQ 2/9 Scores  PHQ - 2 Score 0 0 0 0 0 0 0     Fall Risk    04/02/2022    8:06 AM 03/29/2022    9:15 AM 10/27/2021    2:02 PM 03/25/2021    9:09 AM 09/15/2020    2:29 PM  Fall Risk   Falls in the past year? 0 0 0 0 0  Number falls in past yr: 0  0 0   Injury with Fall? 0  0 0   Risk for fall due to : No Fall Risks  No Fall Risks No Fall Risks No Fall Risks  Follow up Falls evaluation completed  Falls prevention discussed;Falls evaluation completed Falls evaluation completed      Objective:  Joshua Warner seemed alert and oriented and he participated appropriately during our telephone visit.  Blood Pressure Weight BMI  BP Readings from Last 3 Encounters:  10/27/21 124/73  04/28/21 129/83  04/17/21 113/73   Wt Readings from Last 3 Encounters:  10/27/21 217 lb (98.4 kg)  04/28/21 231 lb (104.8 kg)  04/17/21 225 lb (102.1 kg)   BMI Readings from Last 1 Encounters:  10/27/21 31.14 kg/m    *Unable to obtain current vital signs, weight, and BMI due to telephone visit type  Hearing/Vision  Joshua Warner did not seem to have difficulty with hearing/understanding during the  telephone conversation Reports that he has not had a formal eye exam by an eye care professional within the past year Reports that he has had a formal hearing evaluation within the past year *Unable to fully assess hearing and vision during telephone visit type  Cognitive Function:    04/02/2022    8:09 AM 03/25/2021    9:17 AM 07/31/2019   10:13 AM  6CIT Screen  What Year? 0 points 0 points 0 points  What month? 0 points 0 points 0 points  What time? 0 points 0 points 0 points  Count back from 20 0 points 0 points 0 points  Months in reverse 0 points 0 points 0 points  Repeat phrase 0 points 0 points 0 points  Total Score 0 points 0 points 0 points   (Normal:0-7, Significant for Dysfunction: >8)  Normal Cognitive Function Screening: Yes   Immunization & Health Maintenance Record Immunization History  Administered Date(s) Administered   Fluad Quad(high Dose 65+) 03/13/2019, 03/17/2020, 03/20/2021   Influenza Split 03/28/2012, 03/28/2012   Influenza Whole 04/28/2009, 03/28/2010   Influenza, High Dose Seasonal PF 03/04/2017   Influenza, Seasonal, Injecte, Preservative Fre 03/22/2014, 03/28/2015, 02/20/2016   Influenza,inj,Quad PF,6+ Mos 03/22/2014, 03/28/2015, 02/20/2016, 03/13/2018   Influenza-Unspecified 04/19/2013   Moderna SARS-COV2 Booster Vaccination 05/01/2020, 01/01/2021   Moderna Sars-Covid-2 Vaccination 07/30/2019, 08/27/2019   Pneumococcal Conjugate-13 03/04/2017   Pneumococcal Polysaccharide-23 09/11/2018   Td 04/28/2008   Tdap 09/13/2018   Zoster Recombinat (Shingrix) 09/14/2021   Zoster, Live 12/14/2013    Health Maintenance  Topic Date Due   Zoster Vaccines- Shingrix (2 of 2) 11/09/2021   INFLUENZA VACCINE  01/26/2022   COVID-19 Vaccine (3 - Moderna risk series) 04/18/2022 (Originally 01/29/2021)   TETANUS/TDAP  09/12/2028   COLONOSCOPY (Pts 45-38yr Insurance coverage will need to be confirmed)  12/09/2029   Pneumonia Vaccine 70 Years old  Completed    Hepatitis C Screening  Completed   HPV VACCINES  Aged Out       Assessment  This is a routine wellness examination for Joshua Warner  Health Maintenance: Due or Overdue Health Maintenance Due  Topic Date Due   Zoster Vaccines- Shingrix (2 of 2) 11/09/2021   INFLUENZA VACCINE  01/26/2022    SAutumn Messingdoes not need a referral for Community Assistance: Care Management:   no Social Work:    no Prescription Assistance:  no Nutrition/Diabetes Education:  no   Plan:  Personalized Goals  Goals Addressed               This Visit's Progress     Patient Stated (pt-stated)        Patient would like to loose 20 lbs.       Personalized Health Maintenance & Screening Recommendations  Shingrix vaccine- 2nd dose  Lung Cancer Screening Recommended: no (Low Dose CT Chest recommended if Age 70-80years, 30 pack-year currently smoking OR have quit w/in past 15 years) Hepatitis C Screening recommended: no HIV Screening recommended: no  Advanced Directives: Written information was not prepared per patient's request.  Referrals &  Orders No orders of the defined types were placed in this encounter.   Follow-up Plan Follow-up with Hali Marry, MD as planned Medicare wellness visit in one year. Patient will access AVS on my chart.   I have personally reviewed and noted the following in the patient's chart:   Medical and social history Use of alcohol, tobacco or illicit drugs  Current medications and supplements Functional ability and status Nutritional status Physical activity Advanced directives List of other physicians Hospitalizations, surgeries, and ER visits in previous 12 months Vitals Screenings to include cognitive, depression, and falls Referrals and appointments  In addition, I have reviewed and discussed with Joshua Warner certain preventive protocols, quality metrics, and best practice recommendations. A written personalized care plan  for preventive services as well as general preventive health recommendations is available and can be mailed to the patient at his request.      Joshua Gens, RN BSN  04/02/2022

## 2022-04-03 ENCOUNTER — Other Ambulatory Visit: Payer: Self-pay | Admitting: Family Medicine

## 2022-04-03 DIAGNOSIS — I10 Essential (primary) hypertension: Secondary | ICD-10-CM

## 2022-04-19 DIAGNOSIS — M542 Cervicalgia: Secondary | ICD-10-CM | POA: Diagnosis not present

## 2022-04-19 DIAGNOSIS — M9903 Segmental and somatic dysfunction of lumbar region: Secondary | ICD-10-CM | POA: Diagnosis not present

## 2022-04-19 DIAGNOSIS — M9901 Segmental and somatic dysfunction of cervical region: Secondary | ICD-10-CM | POA: Diagnosis not present

## 2022-04-19 DIAGNOSIS — M5451 Vertebrogenic low back pain: Secondary | ICD-10-CM | POA: Diagnosis not present

## 2022-04-29 ENCOUNTER — Encounter: Payer: Self-pay | Admitting: Family Medicine

## 2022-04-29 ENCOUNTER — Ambulatory Visit (INDEPENDENT_AMBULATORY_CARE_PROVIDER_SITE_OTHER): Payer: Medicare Other | Admitting: Family Medicine

## 2022-04-29 VITALS — BP 119/80 | HR 70 | Ht 70.0 in | Wt 217.1 lb

## 2022-04-29 DIAGNOSIS — E785 Hyperlipidemia, unspecified: Secondary | ICD-10-CM

## 2022-04-29 DIAGNOSIS — I442 Atrioventricular block, complete: Secondary | ICD-10-CM

## 2022-04-29 DIAGNOSIS — I10 Essential (primary) hypertension: Secondary | ICD-10-CM

## 2022-04-29 DIAGNOSIS — I2510 Atherosclerotic heart disease of native coronary artery without angina pectoris: Secondary | ICD-10-CM

## 2022-04-29 DIAGNOSIS — B356 Tinea cruris: Secondary | ICD-10-CM

## 2022-04-29 MED ORDER — NITROGLYCERIN 0.4 MG SL SUBL: 0.4000 mg | SUBLINGUAL_TABLET | SUBLINGUAL | 1 refills | Status: DC | PRN

## 2022-04-29 NOTE — Assessment & Plan Note (Addendum)
Well controlled. Continue current regimen. Follow up in  6 mo  Encouraged him to continue to work on portion control and healthy food choices and increasing vegetable intake. A great job with getting to the gym regularly.

## 2022-04-29 NOTE — Assessment & Plan Note (Signed)
Will refill nitroglycerin.

## 2022-04-29 NOTE — Progress Notes (Signed)
Established Patient Office Visit  Subjective   Patient ID: Joshua Czerwinski, male    DOB: [birth year]  Age: 71 y.o. MRN: [id number]  Chief Complaint  Patient presents with   Hypertension    And labs    HPI  Hypertension- Pt denies chest pain, SOB, dizziness, or heart palpitations.  Taking meds as directed w/o problems.  Denies medication side effects.    Hyperlipidemia - tolerating stating well with no myalgias or significant side effects.  Lab Results  Component Value Date   CHOL 109 10/20/2021   HDL 43 10/20/2021   LDLCALC 50 10/20/2021   TRIG 75 10/20/2021   CHOLHDL 2.5 10/20/2021   Has been going to the gym on Monday, Wednesdays and Fridays.  He seems to be doing well with that and trying to be consistent.  He also just wanted to let me know that he is been dealing with jock itch in the groin area on and off for the last year.  He switch to a special soap and has been using an over-the-counter spray and says he thinks it is finally under good control but did want to mention it today.  He also wanted to work on weight loss he like to get back down to about 190 pounds.  He took ephedra back in the 90s.  And wonders if he would be a candidate for something like Ozempic which his wife uses for diabetes.     ROS    Objective:     BP 119/80   Pulse 70   Ht '5\' 10"'$  (1.778 m)   Wt 217 lb 1.9 oz (98.5 kg)   SpO2 94%   BMI 31.15 kg/m    Physical Exam Constitutional:      Appearance: He is well-developed.  HENT:     Head: Normocephalic and atraumatic.  Cardiovascular:     Rate and Rhythm: Normal rate and regular rhythm.     Heart sounds: Normal heart sounds.  Pulmonary:     Effort: Pulmonary effort is normal.     Breath sounds: Normal breath sounds.  Skin:    General: Skin is warm and dry.  Neurological:     Mental Status: He is alert and oriented to person, place, and time.  Psychiatric:        Behavior: Behavior normal.      No results found for any  visits on 04/29/22.    The ASCVD Risk score (Arnett DK, et al., 2019) failed to calculate for the following reasons:   The patient has a prior MI or stroke diagnosis    Assessment & Plan:   Problem List Items Addressed This Visit       Cardiovascular and Mediastinum   HYPERTENSION, BENIGN    Well controlled. Continue current regimen. Follow up in  6 mo  Encouraged him to continue to work on portion control and healthy food choices and increasing vegetable intake. A great job with getting to the gym regularly.      Relevant Medications   nitroGLYCERIN (NITROSTAT) 0.4 MG SL tablet   Other Relevant Orders   BASIC METABOLIC PANEL WITH GFR   Coronary artery disease involving native coronary artery of native heart without angina pectoris    Will refill nitroglycerin.      Relevant Medications   nitroGLYCERIN (NITROSTAT) 0.4 MG SL tablet   Complete heart block (HCC) - Primary   Relevant Medications   nitroGLYCERIN (NITROSTAT) 0.4 MG SL tablet     Other  Hyperlipidemia   Relevant Medications   nitroGLYCERIN (NITROSTAT) 0.4 MG SL tablet   Other Visit Diagnoses     Jock itch           Jock itch-we did discuss that if the rash returns and he is not able to get rid of it with over-the-counter treatments were more than happy to address that and treated if needed.  Return in about 6 months (around 10/28/2022) for Hypertension.    Beatrice Lecher, MD

## 2022-04-30 LAB — BASIC METABOLIC PANEL WITH GFR
BUN: 16 mg/dL (ref 7–25)
CO2: 29 mmol/L (ref 20–32)
Calcium: 9.4 mg/dL (ref 8.6–10.3)
Chloride: 105 mmol/L (ref 98–110)
Creat: 1.08 mg/dL (ref 0.70–1.28)
Glucose, Bld: 82 mg/dL (ref 65–99)
Potassium: 4.8 mmol/L (ref 3.5–5.3)
Sodium: 139 mmol/L (ref 135–146)
eGFR: 74 mL/min/{1.73_m2} (ref >=60)

## 2022-05-27 DIAGNOSIS — M9901 Segmental and somatic dysfunction of cervical region: Secondary | ICD-10-CM | POA: Diagnosis not present

## 2022-05-27 DIAGNOSIS — M5451 Vertebrogenic low back pain: Secondary | ICD-10-CM | POA: Diagnosis not present

## 2022-05-27 DIAGNOSIS — M542 Cervicalgia: Secondary | ICD-10-CM | POA: Diagnosis not present

## 2022-05-27 DIAGNOSIS — M9903 Segmental and somatic dysfunction of lumbar region: Secondary | ICD-10-CM | POA: Diagnosis not present

## 2022-06-17 DIAGNOSIS — M5451 Vertebrogenic low back pain: Secondary | ICD-10-CM | POA: Diagnosis not present

## 2022-06-17 DIAGNOSIS — M9901 Segmental and somatic dysfunction of cervical region: Secondary | ICD-10-CM | POA: Diagnosis not present

## 2022-06-17 DIAGNOSIS — M9903 Segmental and somatic dysfunction of lumbar region: Secondary | ICD-10-CM | POA: Diagnosis not present

## 2022-06-17 DIAGNOSIS — M542 Cervicalgia: Secondary | ICD-10-CM | POA: Diagnosis not present

## 2022-06-30 ENCOUNTER — Ambulatory Visit (INDEPENDENT_AMBULATORY_CARE_PROVIDER_SITE_OTHER): Payer: BC Managed Care – PPO | Admitting: Sports Medicine

## 2022-06-30 ENCOUNTER — Ambulatory Visit (INDEPENDENT_AMBULATORY_CARE_PROVIDER_SITE_OTHER): Payer: BC Managed Care – PPO

## 2022-06-30 DIAGNOSIS — S6992XA Unspecified injury of left wrist, hand and finger(s), initial encounter: Secondary | ICD-10-CM

## 2022-06-30 DIAGNOSIS — R0781 Pleurodynia: Secondary | ICD-10-CM

## 2022-06-30 DIAGNOSIS — M25532 Pain in left wrist: Secondary | ICD-10-CM | POA: Diagnosis not present

## 2022-06-30 DIAGNOSIS — J9811 Atelectasis: Secondary | ICD-10-CM | POA: Diagnosis not present

## 2022-06-30 DIAGNOSIS — R0789 Other chest pain: Secondary | ICD-10-CM | POA: Insufficient documentation

## 2022-06-30 DIAGNOSIS — S299XXA Unspecified injury of thorax, initial encounter: Secondary | ICD-10-CM | POA: Diagnosis not present

## 2022-06-30 NOTE — Assessment & Plan Note (Addendum)
Pleasant 72 year old male, had a fall about 2 weeks ago, now has had persistent pain right chest wall approximately fourth intercostal space anterior axillary line. Tenderness to palpation but symmetric diaphragmatic excursion I would like rib series x-rays, and he will sleep with a pillow under his arm. Declines pain medication.  Update: There is a fracture of the right eighth rib if the pillow helps at night that is sufficient, if not we can give him a chest strapping dressing to stabilize the rib.  This will typically take 6 to 12 weeks to heal.

## 2022-06-30 NOTE — Progress Notes (Addendum)
    Procedures performed today:    None.  Independent interpretation of notes and tests performed by another provider:   None.  Brief History, Exam, Impression, and Recommendations:    Nondisplaced right eighth rib fracture Pleasant 72 year old male, had a fall about 2 weeks ago, now has had persistent pain right chest wall approximately fourth intercostal space anterior axillary line. Tenderness to palpation but symmetric diaphragmatic excursion I would like rib series x-rays, and he will sleep with a pillow under his arm. Declines pain medication.  Update: There is a fracture of the right eighth rib if the pillow helps at night that is sufficient, if not we can give him a chest strapping dressing to stabilize the rib.  This will typically take 6 to 12 weeks to heal.  Left wrist injury History of an old wrist avulsion fracture, now with a recent fall 2 weeks ago has pain tip of the ulna, adding x-rays, he has a brace at home that he will wear for a couple of weeks, suspect TFCC injury, return to see me in approximately 4 weeks for both injuries.    ____________________________________________ Debby PARAS. Curtis, M.D., ABFM., CAQSM., AME. Primary Care and Sports Medicine [facility]  Adjunct Professor of [place] [employer] Medicine  [employer]  [employer]  Restaurant Manager, [employer]

## 2022-06-30 NOTE — Assessment & Plan Note (Signed)
History of an old wrist avulsion fracture, now with a recent fall 2 weeks ago has pain tip of the ulna, adding x-rays, he has a brace at home that he will wear for a couple of weeks, suspect TFCC injury, return to see me in approximately 4 weeks for both injuries.

## 2022-07-01 ENCOUNTER — Other Ambulatory Visit: Payer: Self-pay | Admitting: Family Medicine

## 2022-07-01 DIAGNOSIS — I10 Essential (primary) hypertension: Secondary | ICD-10-CM

## 2022-07-15 DIAGNOSIS — M5451 Vertebrogenic low back pain: Secondary | ICD-10-CM | POA: Diagnosis not present

## 2022-07-15 DIAGNOSIS — M9901 Segmental and somatic dysfunction of cervical region: Secondary | ICD-10-CM | POA: Diagnosis not present

## 2022-07-15 DIAGNOSIS — M542 Cervicalgia: Secondary | ICD-10-CM | POA: Diagnosis not present

## 2022-07-15 DIAGNOSIS — M9903 Segmental and somatic dysfunction of lumbar region: Secondary | ICD-10-CM | POA: Diagnosis not present

## 2022-08-12 DIAGNOSIS — M5451 Vertebrogenic low back pain: Secondary | ICD-10-CM | POA: Diagnosis not present

## 2022-08-12 DIAGNOSIS — M542 Cervicalgia: Secondary | ICD-10-CM | POA: Diagnosis not present

## 2022-08-12 DIAGNOSIS — M9903 Segmental and somatic dysfunction of lumbar region: Secondary | ICD-10-CM | POA: Diagnosis not present

## 2022-08-12 DIAGNOSIS — M9901 Segmental and somatic dysfunction of cervical region: Secondary | ICD-10-CM | POA: Diagnosis not present

## 2022-09-09 DIAGNOSIS — M9901 Segmental and somatic dysfunction of cervical region: Secondary | ICD-10-CM | POA: Diagnosis not present

## 2022-09-09 DIAGNOSIS — M5451 Vertebrogenic low back pain: Secondary | ICD-10-CM | POA: Diagnosis not present

## 2022-09-09 DIAGNOSIS — M9903 Segmental and somatic dysfunction of lumbar region: Secondary | ICD-10-CM | POA: Diagnosis not present

## 2022-09-09 DIAGNOSIS — M542 Cervicalgia: Secondary | ICD-10-CM | POA: Diagnosis not present

## 2022-09-30 DIAGNOSIS — R231 Pallor: Secondary | ICD-10-CM | POA: Diagnosis not present

## 2022-09-30 DIAGNOSIS — R55 Syncope and collapse: Secondary | ICD-10-CM | POA: Diagnosis not present

## 2022-09-30 DIAGNOSIS — R9082 White matter disease, unspecified: Secondary | ICD-10-CM | POA: Diagnosis not present

## 2022-09-30 DIAGNOSIS — Z7982 Long term (current) use of aspirin: Secondary | ICD-10-CM | POA: Diagnosis not present

## 2022-09-30 DIAGNOSIS — R918 Other nonspecific abnormal finding of lung field: Secondary | ICD-10-CM | POA: Diagnosis not present

## 2022-09-30 DIAGNOSIS — R42 Dizziness and giddiness: Secondary | ICD-10-CM | POA: Diagnosis not present

## 2022-09-30 DIAGNOSIS — R410 Disorientation, unspecified: Secondary | ICD-10-CM | POA: Diagnosis not present

## 2022-09-30 DIAGNOSIS — J986 Disorders of diaphragm: Secondary | ICD-10-CM | POA: Diagnosis not present

## 2022-09-30 DIAGNOSIS — I10 Essential (primary) hypertension: Secondary | ICD-10-CM | POA: Diagnosis not present

## 2022-09-30 DIAGNOSIS — E785 Hyperlipidemia, unspecified: Secondary | ICD-10-CM | POA: Diagnosis not present

## 2022-09-30 DIAGNOSIS — K219 Gastro-esophageal reflux disease without esophagitis: Secondary | ICD-10-CM | POA: Diagnosis not present

## 2022-09-30 DIAGNOSIS — R9431 Abnormal electrocardiogram [ECG] [EKG]: Secondary | ICD-10-CM | POA: Diagnosis not present

## 2022-09-30 DIAGNOSIS — R112 Nausea with vomiting, unspecified: Secondary | ICD-10-CM | POA: Diagnosis not present

## 2022-09-30 DIAGNOSIS — Z79899 Other long term (current) drug therapy: Secondary | ICD-10-CM | POA: Diagnosis not present

## 2022-09-30 DIAGNOSIS — J984 Other disorders of lung: Secondary | ICD-10-CM | POA: Diagnosis not present

## 2022-09-30 DIAGNOSIS — R531 Weakness: Secondary | ICD-10-CM | POA: Diagnosis not present

## 2022-09-30 DIAGNOSIS — I2510 Atherosclerotic heart disease of native coronary artery without angina pectoris: Secondary | ICD-10-CM | POA: Diagnosis not present

## 2022-10-07 ENCOUNTER — Telehealth (INDEPENDENT_AMBULATORY_CARE_PROVIDER_SITE_OTHER): Payer: Medicare Other | Admitting: Family Medicine

## 2022-10-07 ENCOUNTER — Encounter: Payer: Self-pay | Admitting: Family Medicine

## 2022-10-07 DIAGNOSIS — R42 Dizziness and giddiness: Secondary | ICD-10-CM

## 2022-10-07 DIAGNOSIS — M9903 Segmental and somatic dysfunction of lumbar region: Secondary | ICD-10-CM | POA: Diagnosis not present

## 2022-10-07 DIAGNOSIS — M9901 Segmental and somatic dysfunction of cervical region: Secondary | ICD-10-CM | POA: Diagnosis not present

## 2022-10-07 DIAGNOSIS — M5451 Vertebrogenic low back pain: Secondary | ICD-10-CM | POA: Diagnosis not present

## 2022-10-07 DIAGNOSIS — M542 Cervicalgia: Secondary | ICD-10-CM | POA: Diagnosis not present

## 2022-10-07 NOTE — Progress Notes (Signed)
    Virtual Visit via Video Note  I connected with Joshua Warner on 10/07/22 at  1:00 PM EDT by a video enabled telemedicine application and verified that I am speaking with the correct person using two identifiers.   I discussed the limitations of evaluation and management by telemedicine and the availability of in person appointments. The patient expressed understanding and agreed to proceed.  Patient location: at home Provider location: in office  Subjective:    CC:   Chief Complaint  Patient presents with   Follow-up    Ed follow up for vertigo     HPI:   Spoke with pt he states that he is feeling ok. He just had an appt with his Chiropractor this was a regular follow up   He stated that he was looking at his phone and felt like he was being pulled forward and when he looked up everything started to spin. He was able to sit down and the spinning was better but then felt dizzy and vomited a couple of times. . The manager came over and helped him and called EMS and asked if he needed to go to the ED. Cardiac enzymes were normal Head CT, CXR,  and normal EKG. Labs ok as well.    He did go and was checked out there was nothing wrong with his heart .     He stated that the last time that he had experienced this was about 15-20 yrs ago   Still noting a little vertigo when leans forward.   Reviewed ED notes   Past medical history, Surgical history, Family history not pertinant except as noted below, Social history, Allergies, and medications have been entered into the medical record, reviewed, and corrections made.    Objective:    General: Speaking clearly in complete sentences without any shortness of breath.  Alert and oriented x3.  Normal judgment. No apparent acute distress.    Impression and Recommendations:    Problem List Items Addressed This Visit   None Visit Diagnoses     Vertigo    -  Primary      Sounds like vertigo. Everything checked out well in  the Ed and he is feeling much better. Recommend vestibular rehab if start getting recurring symptoms.  He is much better.    Call if any new or recurring sxs.  Wants to go back to the gym tomorrow. Usually does 20 min treadmill and 20 min on stationary bike. Ok to return tomorrow and do a light workup.   No orders of the defined types were placed in this encounter.   No orders of the defined types were placed in this encounter.  I spent 25 minutes on the day of the encounter to include pre-visit record review, face-to-face time with the patient and post visit ordering of test.   I discussed the assessment and treatment plan with the patient. The patient was provided an opportunity to ask questions and all were answered. The patient agreed with the plan and demonstrated an understanding of the instructions.   The patient was advised to call back or seek an in-person evaluation if the symptoms worsen or if the condition fails to improve as anticipated.   Nani Gasser, MD

## 2022-10-07 NOTE — Progress Notes (Signed)
Spoke with pt he states that he is feeling ok. He just had an appt with his Chiropractor this was a regular follow up  He stated that he was looking at his phone and felt like he was being pulled forward and when he looked up everything started to spin and he felt dizzy and vomited. The manager came over and helped him and called EMS and asked if he needed to go to the ED.   He did go and was checked out there was nothing wrong with his heart .    He stated that the last time that he had experienced this was about 15-20 yrs ago

## 2022-10-11 ENCOUNTER — Encounter: Payer: Self-pay | Admitting: Family Medicine

## 2022-10-11 ENCOUNTER — Encounter: Payer: Self-pay | Admitting: *Deleted

## 2022-10-11 DIAGNOSIS — R42 Dizziness and giddiness: Secondary | ICD-10-CM

## 2022-10-12 ENCOUNTER — Encounter: Payer: Self-pay | Admitting: Physical Therapy

## 2022-10-12 ENCOUNTER — Other Ambulatory Visit: Payer: Self-pay

## 2022-10-12 ENCOUNTER — Ambulatory Visit: Payer: Medicare Other | Attending: Family Medicine | Admitting: Physical Therapy

## 2022-10-12 DIAGNOSIS — R42 Dizziness and giddiness: Secondary | ICD-10-CM | POA: Diagnosis not present

## 2022-10-12 NOTE — Therapy (Unsigned)
OUTPATIENT PHYSICAL THERAPY VESTIBULAR EVALUATION     Patient Name: Joshua Warner MRN: [id number] DOB:04-Jan-1951, 72 y.o., male Today's Date: 10/12/2022  END OF SESSION:  PT End of Session - 10/12/22 1706     Visit Number 1    Number of Visits 12    Date for PT Re-Evaluation 11/23/22    Authorization Type BCBS Medicare    Authorization - Visit Number 1    Progress Note Due on Visit 10    PT Start Time 1615    PT Stop Time 1700    PT Time Calculation (min) 45 min    Activity Tolerance Patient tolerated treatment well    Behavior During Therapy [facility] for tasks assessed/performed             Past Medical History:  Diagnosis Date   Arthritis    CHF (congestive heart failure)    stents   Coronary artery disease    stents   GERD (gastroesophageal reflux disease)    Hyperlipidemia    Hypertension since 2007   Kidney stone 2022   Kidney stones 2009   Myocardial infarction 12/2015   Oxygen deficiency    only during heartattack   Past Surgical History:  Procedure Laterality Date   CORONARY ANGIOPLASTY WITH STENT PLACEMENT  12/2015   KNEE ARTHROSCOPY  12/31/2010   Right knee    TONSILLECTOMY  1957   Patient Active Problem List   Diagnosis Date Noted   Nondisplaced right eighth rib fracture 06/30/2022   Left wrist injury 06/30/2022   Greater trochanteric bursitis, left 01/29/2022   Actinic keratosis 03/21/2021   Kidney stone 03/21/2021   Family history of factor V deficiency 09/15/2020   Purpura senilis 03/17/2020   Proteins serum plasma low 03/17/2020   Venous stasis 01/28/2020   Schatzki's ring 09/10/2019   Osteoarthritis of first metatarsophalangeal (MTP) joint of right foot 05/24/2017   Primary osteoarthritis of left hand 12/10/2016   Coronary artery disease involving native coronary artery of native heart without angina pectoris 01/03/2016   Status post insertion of drug eluting coronary artery stent 01/03/2016   STEMI (ST elevation myocardial  infarction) 01/03/2016   Complete heart block 01/03/2016   Syncope 01/03/2016   Subscapularis tendinitis of left shoulder 10/25/2014   Hyperlipidemia 08/12/2014   Extensor intersection syndrome of both wrists 08/02/2013   Cough 08/02/2013   Allergic reaction 02/08/2013   Baker's cyst, left 01/11/2013   Solitary pulmonary nodule 11/14/2012   Arthritis of carpometacarpal joint 10/17/2012   HYPOGONADISM 06/04/2009   HYPERTENSION, BENIGN 06/04/2009   GERD 06/04/2009   ERECTILE DYSFUNCTION, ORGANIC 06/04/2009    PCP: Linford Arnold REFERRING PROVIDER: Linford Arnold  REFERRING DIAG: BPPV  THERAPY DIAG:  Dizziness and giddiness  ONSET DATE: 09/29/22  Rationale for Evaluation and Treatment: Rehabilitation  SUBJECTIVE:   SUBJECTIVE STATEMENT: Pt had an incident of vertigo with spinning sensation after standing and scrolling quickly on his phone. He had vomiting and was taken to the ER. He states the spinning only lasted a few minutes but vomiting lasted an hour or 2. Pt was cleared of cardiac and neuro issues in ER and was able to go home that day. He states that since that incident he has had a few instances where he feels the vertigo might start again but no other instances. He has been taking meclazine to depress symptoms. Pt has not taken meclazine today and still feels ok.   PERTINENT HISTORY: history of vertigo, hearing aids  PAIN:  Are you having pain?  No  PRECAUTIONS: None  WEIGHT BEARING RESTRICTIONS: No  FALLS: Has patient fallen in last 6 months? Yes. Number of falls 1   PLOF: Independent  PATIENT GOALS: decrease flare ups of vertigo   VESTIBULAR ASSESSMENT:     SYMPTOM BEHAVIOR:   Type of dizziness: Spinning/Vertigo  Frequency: 1x occurance  Duration: less than 1 minute  Aggravating factors: No known aggravating factors  Relieving factors: medication  Progression of symptoms: better  OCULOMOTOR EXAM:  Ocular Alignment: normal  Ocular ROM: No  Limitations  Spontaneous Nystagmus: absent  Gaze-Induced Nystagmus: age appropriate nystagmus at end range  Smooth Pursuits: intact  Saccades: slow   VESTIBULAR - OCULAR REFLEX:   Slow VOR: Normal  VOR Cancellation: Normal  Head-Impulse Test: HIT Right: positive HIT Left: negative     POSITIONAL TESTING: Right Dix-Hallpike: no nystagmus and pt with symptoms (headache/pressure) after 15 seconds, lasting 15 seconds Left Dix-Hallpike: no nystagmus Right Roll Test: no nystagmus Left Roll Test: no nystagmus  MOTION SENSITIVITY:  Motion Sensitivity Quotient Intensity: 0 = none, 1 = Lightheaded, 2 = Mild, 3 = Moderate, 4 = Severe, 5 = Vomiting  Intensity  1. Sitting to supine   2. Supine to L side 2  3. Supine to R side 2  4. Supine to sitting   5. L Hallpike-Dix   6. Up from L    7. R Hallpike-Dix 3  8. Up from R    9. Sitting, head tipped to L knee   10. Head up from L knee   11. Sitting, head tipped to R knee   12. Head up from R knee   13. Sitting head turns x5   14.Sitting head nods x5   15. In stance, 180 turn to L    16. In stance, 180 turn to R       VESTIBULAR TREATMENT:                                                                                                   DATE: 10/12/22  Gaze Adaptation:  x1 Viewing Horizontal: Position: seated and Reps: 10 Habituation:  Repeated Rolling: number of reps: 2 bilat   PATIENT EDUCATION: Education details: PT POC and goals, HEP Person educated: Patient Education method: Explanation, Demonstration, and Handouts Education comprehension: verbalized understanding and returned demonstration  HOME EXERCISE PROGRAM: Access Code: [email or web address] URL: [email or web address] Date: 10/12/2022 Prepared by: Reggy Eye  Exercises - Seated Gaze Stabilization with Head Rotation  - 3 x daily - 7 x weekly - 3 sets - 10 reps - Supine to Right Sidelying Vestibular Habituation  - 3 x daily - 7 x weekly - 1 sets - 10  reps - Supine to Left Sidelying Vestibular Habituation  - 3 x daily - 7 x weekly - 1 sets - 10 reps  GOALS: Goals reviewed with patient? Yes  SHORT TERM GOALS: Target date: 10/26/2022    Pt will be independent with initial HEP Baseline: Goal status: INITIAL   LONG TERM GOALS: Target date: 11/23/2022    Pt will be independent with advanced HEP Baseline:  Goal status: INITIAL  2.  Pt is able to roll in bed without symptoms 6/7 days Baseline:  Goal status: INITIAL    ASSESSMENT:  CLINICAL IMPRESSION: Patient is a 72 y.o. male who was seen today for physical therapy evaluation and treatment for BPPV. Pt presents with vestibular hypofunction and motion sensitivity noticeable with rolling and with VOR x 1 horizontal. He will benefit from skilled PT to address deficits and decrease episodes of dizziness.   OBJECTIVE IMPAIRMENTS: dizziness.   ACTIVITY LIMITATIONS: bed mobility  PARTICIPATION LIMITATIONS: community activity  PERSONAL FACTORS: Past/current experiences and Time since onset of injury/illness/exacerbation are also affecting patient's functional outcome.   REHAB POTENTIAL: Good  CLINICAL DECISION MAKING: Evolving/moderate complexity  EVALUATION COMPLEXITY: Moderate   PLAN:  PT FREQUENCY: 1-2x/week  PT DURATION: 6 weeks  PLANNED INTERVENTIONS: Therapeutic exercises, Therapeutic activity, Neuromuscular re-education, Balance training, Gait training, Patient/Family education, Self Care, Joint mobilization, Vestibular training, Canalith repositioning, and Re-evaluation  PLAN FOR NEXT SESSION: assess response to HEP, continue habituation as needed   Phillip Sandler, PT 10/12/2022, 5:09 PM

## 2022-10-12 NOTE — Telephone Encounter (Signed)
Orders Placed This Encounter  Procedures  . Ambulatory referral to Physical Therapy    Referral Priority:   Routine    Referral Type:   Physical Medicine    Referral Reason:   Specialty Services Required    Requested Specialty:   Physical Therapy    Number of Visits Requested:   1    

## 2022-10-20 ENCOUNTER — Ambulatory Visit: Payer: Medicare Other | Admitting: Rehabilitative and Restorative Service Providers"

## 2022-10-20 ENCOUNTER — Encounter: Payer: Self-pay | Admitting: Rehabilitative and Restorative Service Providers"

## 2022-10-20 DIAGNOSIS — R42 Dizziness and giddiness: Secondary | ICD-10-CM

## 2022-10-20 NOTE — Therapy (Signed)
OUTPATIENT PHYSICAL THERAPY VESTIBULAR TREATMENT  Patient Name: Joshua Warner MRN: [id number] DOB:May 19, 1951, 72 y.o., male Today's Date: 10/20/2022  END OF SESSION:  PT End of Session - 10/20/22 1103     Visit Number 2    Number of Visits 12    Date for PT Re-Evaluation 11/23/22    Authorization Type BCBS Medicare    Progress Note Due on Visit 10    PT Start Time 1107    PT Stop Time 1145    PT Time Calculation (min) 38 min    Activity Tolerance Patient tolerated treatment well    Behavior During Therapy [facility] for tasks assessed/performed            Past Medical History:  Diagnosis Date   Arthritis    CHF (congestive heart failure)    stents   Coronary artery disease    stents   GERD (gastroesophageal reflux disease)    Hyperlipidemia    Hypertension since 2007   Kidney stone 2022   Kidney stones 2009   Myocardial infarction 12/2015   Oxygen deficiency    only during heartattack   Past Surgical History:  Procedure Laterality Date   CORONARY ANGIOPLASTY WITH STENT PLACEMENT  12/2015   KNEE ARTHROSCOPY  12/31/2010   Right knee    TONSILLECTOMY  1957   Patient Active Problem List   Diagnosis Date Noted   Nondisplaced right eighth rib fracture 06/30/2022   Left wrist injury 06/30/2022   Greater trochanteric bursitis, left 01/29/2022   Actinic keratosis 03/21/2021   Kidney stone 03/21/2021   Family history of factor V deficiency 09/15/2020   Purpura senilis 03/17/2020   Proteins serum plasma low 03/17/2020   Venous stasis 01/28/2020   Schatzki's ring 09/10/2019   Osteoarthritis of first metatarsophalangeal (MTP) joint of right foot 05/24/2017   Primary osteoarthritis of left hand 12/10/2016   Coronary artery disease involving native coronary artery of native heart without angina pectoris 01/03/2016   Status post insertion of drug eluting coronary artery stent 01/03/2016   STEMI (ST elevation myocardial infarction) 01/03/2016   Complete heart block  01/03/2016   Syncope 01/03/2016   Subscapularis tendinitis of left shoulder 10/25/2014   Hyperlipidemia 08/12/2014   Extensor intersection syndrome of both wrists 08/02/2013   Cough 08/02/2013   Allergic reaction 02/08/2013   Baker's cyst, left 01/11/2013   Solitary pulmonary nodule 11/14/2012   Arthritis of carpometacarpal joint 10/17/2012   HYPOGONADISM 06/04/2009   HYPERTENSION, BENIGN 06/04/2009   GERD 06/04/2009   ERECTILE DYSFUNCTION, ORGANIC 06/04/2009    PCP: Linford Arnold REFERRING PROVIDER: Nani Gasser MD REFERRING DIAG: BPPV THERAPY DIAG:  No diagnosis found.  ONSET DATE: 09/29/22 Rationale for Evaluation and Treatment: Rehabilitation  SUBJECTIVE:  SUBJECTIVE STATEMENT: The patient reports he had one episode of this vertigo in 2006. He has not had any other episodes since his recent attack 3 weeks ago. The home exercises are going okay. He didn't do the exercises regularly b/c he was afraid the vertigo may come on. "I don't do well in the dark." Because of balance.   EVAL: Pt had an incident of vertigo with spinning sensation after standing and scrolling quickly on his phone. He had vomiting and was taken to the ER. He states the spinning only lasted a few minutes but vomiting lasted an hour or 2. Pt was cleared of cardiac and neuro issues in ER and was able to go home that day. He states that since that incident he has had a few instances  where he feels the vertigo might start again but no other instances. He has been taking meclazine to depress symptoms. Pt has not taken meclazine today and still feels ok. PERTINENT HISTORY: history of vertigo, hearing aids PAIN:  Are you having pain? No PRECAUTIONS: None WEIGHT BEARING RESTRICTIONS: No FALLS: Has patient fallen in last 6 months? Yes. Number of falls 1 PATIENT GOALS: decrease flare ups of vertigo  VESTIBULAR ASSESSMENT: (Measures in this section from initial evaluation unless otherwise noted) SYMPTOM  BEHAVIOR:  Type of dizziness: Spinning/Vertigo  Frequency: 1x occurance  Duration: less than 1 minute  Aggravating factors: No known aggravating factors  Relieving factors: medication  Progression of symptoms: better OCULOMOTOR EXAM:  Ocular Alignment: normal  Ocular ROM: No Limitations  Spontaneous Nystagmus: absent  Gaze-Induced Nystagmus: age appropriate nystagmus at end range  Smooth Pursuits: intact  Saccades: slow  VESTIBULAR - OCULAR REFLEX:   Slow VOR: Normal  VOR Cancellation: Normal  Head-Impulse Test: HIT Right: positive HIT Left: negative POSITIONAL TESTING: Right Dix-Hallpike: no nystagmus and pt with symptoms (headache/pressure) after 15 seconds, lasting 15 seconds Left Dix-Hallpike: no nystagmus Right Roll Test: no nystagmus Left Roll Test: no nystagmus MOTION SENSITIVITY:  Motion Sensitivity Quotient Intensity: 0 = none, 1 = Lightheaded, 2 = Mild, 3 = Moderate, 4 = Severe, 5 = Vomiting  Intensity  1. Sitting to supine   2. Supine to L side 2  3. Supine to R side 2  4. Supine to sitting   5. L Hallpike-Dix   6. Up from L    7. R Hallpike-Dix 3  8. Up from R    9. Sitting, head tipped to L knee   10. Head up from L knee   11. Sitting, head tipped to R knee   12. Head up from R knee   13. Sitting head turns x5   14.Sitting head nods x5   15. In stance, 180 turn to L    16. In stance, 180 turn to R    [facility] Adult PT Treatment:                                                DATE: 10/20/22 Neuromuscular re-ed: Corner balance exercises in standing with eyes open and eyes closed Gaze adaptation x 1 viewing horizontal plane Gaze adaptation x 1 viewing vertical plane Positional testing:  negative bilat horizontal roll test for nystagmus and mild sensation of dizziness (notes a sensation of head and eyes needing to move together) Positional testing: Sit<>sidelying without vertigo, but does note a sensation things have to settle (head and eyes not moving  together) Gait: With head rotation side to side x 30 feet x 4 reps Self Care: Discussed purposes and goals of exercises for patient buy in and compliance  PATIENT EDUCATION: Education details: PT POC and goals, HEP Person educated: Patient Education method: Explanation, Demonstration, and Handouts Education comprehension: verbalized understanding and returned demonstration  HOME EXERCISE PROGRAM: Access Code: [email or web address] URL: [email or web address] Date: 10/12/2022 Prepared by: Reggy Eye  Exercises - Seated Gaze Stabilization with Head Rotation  - 3 x daily - 7 x weekly - 3 sets - 10 reps - Supine to Right Sidelying Vestibular Habituation  - 3 x daily - 7 x weekly - 1 sets - 10 reps - Supine to Left Sidelying Vestibular Habituation  - 3 x daily -  7 x weekly - 1 sets - 10 reps  GOALS: Goals reviewed with patient? Yes  SHORT TERM GOALS: Target date: 10/26/2022  Pt will be independent with initial HEP Baseline: Goal status: INITIAL  LONG TERM GOALS: Target date: 11/23/2022   Pt will be independent with advanced HEP Baseline:  Goal status: INITIAL  2.  Pt is able to roll in bed without symptoms 6/7 days Baseline:  Goal status: INITIAL  ASSESSMENT: CLINICAL IMPRESSION: Patient describes an episode in college of ice pick type headache with n/v. He also gets a mild headache today with gaze x 1 viewing exercises. He then described ocular migraine type symptoms. This recent episode was precipitated by visual scrolling on his phone.  PT encouraged continuation of HEP with modifications. We discussed potential visual reliance due to h/o atypical HA. Plan to see in 10-14 days to see how HEP progressing.   EVAL: Patient is a 71 y.o. male who was seen today for physical therapy evaluation and treatment for BPPV. Pt presents with vestibular hypofunction and motion sensitivity noticeable with rolling and with VOR x 1 horizontal. He will benefit from skilled PT to address  deficits and decrease episodes of dizziness.   OBJECTIVE IMPAIRMENTS: dizziness.   PLAN:  PT FREQUENCY: 1-2x/week  PT DURATION: 6 weeks  PLANNED INTERVENTIONS: Therapeutic exercises, Therapeutic activity, Neuromuscular re-education, Balance training, Gait training, Patient/Family education, Self Care, Joint mobilization, Vestibular training, Canalith repositioning, and Re-evaluation  PLAN FOR NEXT SESSION: assess response to HEP, continue habituation as needed   Reygan Heagle, PT 10/20/2022, 11:12 AM

## 2022-10-25 DIAGNOSIS — M9901 Segmental and somatic dysfunction of cervical region: Secondary | ICD-10-CM | POA: Diagnosis not present

## 2022-10-25 DIAGNOSIS — M5451 Vertebrogenic low back pain: Secondary | ICD-10-CM | POA: Diagnosis not present

## 2022-10-25 DIAGNOSIS — M542 Cervicalgia: Secondary | ICD-10-CM | POA: Diagnosis not present

## 2022-10-25 DIAGNOSIS — M9903 Segmental and somatic dysfunction of lumbar region: Secondary | ICD-10-CM | POA: Diagnosis not present

## 2022-10-28 ENCOUNTER — Ambulatory Visit: Payer: Medicare Other | Admitting: Family Medicine

## 2022-10-28 NOTE — Progress Notes (Deleted)
   Established Patient Office Visit  Subjective   Patient ID: Joshua Warner, male    DOB: [birth year]  Age: 72 y.o. MRN: [id number]  No chief complaint on file.   HPI   Hypertension- Pt denies chest pain, SOB, dizziness, or heart palpitations.  Taking meds as directed w/o problems.  Denies medication side effects.    {History (Optional):23778}  ROS    Objective:     There were no vitals taken for this visit. {Vitals History (Optional):23777}  Physical Exam   No results found for any visits on 10/28/22.  {Labs (Optional):23779}  The ASCVD Risk score (Arnett DK, et al., 2019) failed to calculate for the following reasons:   The patient has a prior MI or stroke diagnosis    Assessment & Plan:   Problem List Items Addressed This Visit       Cardiovascular and Mediastinum   HYPERTENSION, BENIGN - Primary    No follow-ups on file.    Nani Gasser, MD

## 2022-11-03 ENCOUNTER — Ambulatory Visit: Payer: Medicare Other | Attending: Family Medicine | Admitting: Rehabilitative and Restorative Service Providers"

## 2022-11-03 ENCOUNTER — Encounter: Payer: Self-pay | Admitting: Rehabilitative and Restorative Service Providers"

## 2022-11-03 DIAGNOSIS — R42 Dizziness and giddiness: Secondary | ICD-10-CM | POA: Insufficient documentation

## 2022-11-03 NOTE — Therapy (Signed)
OUTPATIENT PHYSICAL THERAPY VESTIBULAR TREATMENT AND DISCHARGE SUMMARY  Patient Name: Abdulkareem Goodly MRN: [id number] DOB:June 18, 1951, 72 y.o., male Today's Date: 11/03/2022   PHYSICAL THERAPY DISCHARGE SUMMARY  Visits from Start of Care: 3  Current functional level related to goals / functional outcomes: See goals below.   Remaining deficits: Occasional sensation of eyes and head having to catch up, occasional episodes of visual stimulation with scrolling.   Education / Equipment: HEP   Patient agrees to discharge. Patient goals were met. Patient is being discharged due to meeting the stated rehab goals.  END OF SESSION:  PT End of Session - 11/03/22 0934     Visit Number 3    Number of Visits 12    Date for PT Re-Evaluation 11/23/22    Authorization Type BCBS Medicare    Progress Note Due on Visit 10    PT Start Time 0935    PT Stop Time 1005    PT Time Calculation (min) 30 min    Activity Tolerance Patient tolerated treatment well    Behavior During Therapy [facility] for tasks assessed/performed            Past Medical History:  Diagnosis Date   Arthritis    CHF (congestive heart failure) (HCC)    stents   Coronary artery disease    stents   GERD (gastroesophageal reflux disease)    Hyperlipidemia    Hypertension since 2007   Kidney stone 2022   Kidney stones 2009   Myocardial infarction (HCC) 12/2015   Oxygen deficiency    only during heartattack   Past Surgical History:  Procedure Laterality Date   CORONARY ANGIOPLASTY WITH STENT PLACEMENT  12/2015   KNEE ARTHROSCOPY  12/31/2010   Right knee    TONSILLECTOMY  1957   Patient Active Problem List   Diagnosis Date Noted   Nondisplaced right eighth rib fracture 06/30/2022   Left wrist injury 06/30/2022   Greater trochanteric bursitis, left 01/29/2022   Actinic keratosis 03/21/2021   Kidney stone 03/21/2021   Family history of factor V deficiency 09/15/2020   Purpura senilis (HCC) 03/17/2020   Proteins  serum plasma low 03/17/2020   Venous stasis 01/28/2020   Schatzki's ring 09/10/2019   Osteoarthritis of first metatarsophalangeal (MTP) joint of right foot 05/24/2017   Primary osteoarthritis of left hand 12/10/2016   Coronary artery disease involving native coronary artery of native heart without angina pectoris 01/03/2016   Status post insertion of drug eluting coronary artery stent 01/03/2016   STEMI (ST elevation myocardial infarction) (HCC) 01/03/2016   Complete heart block (HCC) 01/03/2016   Syncope 01/03/2016   Subscapularis tendinitis of left shoulder 10/25/2014   Hyperlipidemia 08/12/2014   Extensor intersection syndrome of both wrists 08/02/2013   Cough 08/02/2013   Allergic reaction 02/08/2013   Baker's cyst, left 01/11/2013   Solitary pulmonary nodule 11/14/2012   Arthritis of carpometacarpal joint 10/17/2012   HYPOGONADISM 06/04/2009   HYPERTENSION, BENIGN 06/04/2009   GERD 06/04/2009   ERECTILE DYSFUNCTION, ORGANIC 06/04/2009    PCP: Linford Arnold REFERRING PROVIDER: Nani Gasser MD REFERRING DIAG: BPPV THERAPY DIAG:  Dizziness and giddiness  ONSET DATE: 09/29/22 Rationale for Evaluation and Treatment: Rehabilitation  SUBJECTIVE:  SUBJECTIVE STATEMENT: The patient had one episode of potential dizziness when looking at his phone-- he put the phone down and stopped the episode. The home exercises-- he is not doing regularly, but he is going to the gym. He does get occasional sensation it could come on with L rolling in  the morning.  EVAL: Pt had an incident of vertigo with spinning sensation after standing and scrolling quickly on his phone. He had vomiting and was taken to the ER. He states the spinning only lasted a few minutes but vomiting lasted an hour or 2. Pt was cleared of cardiac and neuro issues in ER and was able to go home that day. He states that since that incident he has had a few instances where he feels the vertigo might start again but no other  instances. He has been taking meclazine to depress symptoms. Pt has not taken meclazine today and still feels ok. PERTINENT HISTORY: history of vertigo, hearing aids PAIN:  Are you having pain? No PRECAUTIONS: None WEIGHT BEARING RESTRICTIONS: No FALLS: Has patient fallen in last 6 months? Yes. Number of falls 1 PATIENT GOALS: decrease flare ups of vertigo  VESTIBULAR ASSESSMENT: (Measures in this section from initial evaluation unless otherwise noted) SYMPTOM BEHAVIOR:  Type of dizziness: Spinning/Vertigo  Frequency: 1x occurance  Duration: less than 1 minute  Aggravating factors: No known aggravating factors  Relieving factors: medication  Progression of symptoms: better OCULOMOTOR EXAM:  Ocular Alignment: normal  Ocular ROM: No Limitations  Spontaneous Nystagmus: absent  Gaze-Induced Nystagmus: age appropriate nystagmus at end range  Smooth Pursuits: intact  Saccades: slow  VESTIBULAR - OCULAR REFLEX:   Slow VOR: Normal  VOR Cancellation: Normal  Head-Impulse Test: HIT Right: positive HIT Left: negative POSITIONAL TESTING: Right Dix-Hallpike: no nystagmus and pt with symptoms (headache/pressure) after 15 seconds, lasting 15 seconds Left Dix-Hallpike: no nystagmus Right Roll Test: no nystagmus Left Roll Test: no nystagmus MOTION SENSITIVITY:  Motion Sensitivity Quotient Intensity: 0 = none, 1 = Lightheaded, 2 = Mild, 3 = Moderate, 4 = Severe, 5 = Vomiting  Intensity  1. Sitting to supine   2. Supine to L side 2  3. Supine to R side 2  4. Supine to sitting   5. L Hallpike-Dix   6. Up from L    7. R Hallpike-Dix 3  8. Up from R    9. Sitting, head tipped to L knee   10. Head up from L knee   11. Sitting, head tipped to R knee   12. Head up from R knee   13. Sitting head turns x5   14.Sitting head nods x5   15. In stance, 180 turn to L    16. In stance, 180 turn to R    [facility] Adult PT Treatment:                                                DATE:  11/03/22 Neuromuscular re-ed: Habituation rolling R and L-- patient is not tolerant of lying supine, and he notes a sense of eyes and head having to catch up when he rolls from sidelying to supine. *This is not dizziness, but more descriptive of diminished VOR.  Gaze adaptation x 1 viewing horizontal plane  Gaze adaptation x 1 viewing vertical plane He notes dizziness when looking into low cabinets Habituation in corner with head turns vertical and horizontal x 10 reps each Corner standing with narrow stance with eyes closed x 30 seconds x 3 times Self Care: Purpose for continuing HEP Gym routine-- he is working on getting 150 minutes of cardio each week and doing other strengthening activities  [facility] Adult PT  Treatment:                                                DATE: 10/20/22 Neuromuscular re-ed: Corner balance exercises in standing with eyes open and eyes closed Gaze adaptation x 1 viewing horizontal plane Gaze adaptation x 1 viewing vertical plane Positional testing:  negative bilat horizontal roll test for nystagmus and mild sensation of dizziness (notes a sensation of head and eyes needing to move together) Positional testing: Sit<>sidelying without vertigo, but does note a sensation things have to settle (head and eyes not moving together) Gait: With head rotation side to side x 30 feet x 4 reps Self Care: Discussed purposes and goals of exercises for patient buy in and compliance  PATIENT EDUCATION: Education details: PT POC and goals, HEP Person educated: Patient Education method: Explanation, Demonstration, and Handouts Education comprehension: verbalized understanding and returned demonstration  HOME EXERCISE PROGRAM: Access Code: [email or web address] URL: [email or web address] Date: 10/12/2022 Prepared by: [facility]  Exercises - Seated Gaze Stabilization with Head Rotation  - 3 x daily - 7 x weekly - 3 sets - 10 reps - Supine to Right Sidelying Vestibular  Habituation  - 3 x daily - 7 x weekly - 1 sets - 10 reps - Supine to Left Sidelying Vestibular Habituation  - 3 x daily - 7 x weekly - 1 sets - 10 reps  GOALS: Goals reviewed with patient? Yes  SHORT TERM GOALS: Target date: 10/26/2022  Pt will be independent with initial HEP Goal status: MET  LONG TERM GOALS: Target date: 11/23/2022  Pt will be independent with advanced HEP Goal status: MET  2.  Pt is able to roll in bed without symptoms 6/7 days Baseline: Reports no true dizziness-- he gets occasional sensation of "catching up" between eyes and head-- addressing with VOR Goal status: MET  ASSESSMENT: CLINICAL IMPRESSION: Patient has met all STGs and LTGs.  He has not had another severe episode of vertigo.  He does seem sensitive to visual scrolling-- PT HEP is focused on strengthening multi-sensory balance by working on inner ear/brain connection to reduce reliance on his visual system. We also provided habituation for bending/looking into cabinets as this movement can occasionally provoke symptoms.  Patient has HEP and plans to continue post d/c.  EVAL: Patient is a 72 y.o. male who was seen today for physical therapy evaluation and treatment for BPPV. Pt presents with vestibular hypofunction and motion sensitivity noticeable with rolling and with VOR x 1 horizontal. He will benefit from skilled PT to address deficits and decrease episodes of dizziness.   OBJECTIVE IMPAIRMENTS: dizziness.   PLAN:  PT FREQUENCY: 1-2x/week  PT DURATION: 6 weeks  PLANNED INTERVENTIONS: Therapeutic exercises, Therapeutic activity, Neuromuscular re-education, Balance training, Gait training, Patient/Family education, Self Care, Joint mobilization, Vestibular training, Canalith repositioning, and Re-evaluation  PLAN FOR NEXT SESSION: DISCHARGE TODAY   Merrily Tegeler, PT 11/03/2022, 10:39 AM

## 2022-11-04 DIAGNOSIS — M9901 Segmental and somatic dysfunction of cervical region: Secondary | ICD-10-CM | POA: Diagnosis not present

## 2022-11-04 DIAGNOSIS — M9903 Segmental and somatic dysfunction of lumbar region: Secondary | ICD-10-CM | POA: Diagnosis not present

## 2022-11-04 DIAGNOSIS — M5451 Vertebrogenic low back pain: Secondary | ICD-10-CM | POA: Diagnosis not present

## 2022-11-04 DIAGNOSIS — M542 Cervicalgia: Secondary | ICD-10-CM | POA: Diagnosis not present

## 2022-11-08 ENCOUNTER — Encounter: Payer: Self-pay | Admitting: Family Medicine

## 2022-11-08 ENCOUNTER — Ambulatory Visit (INDEPENDENT_AMBULATORY_CARE_PROVIDER_SITE_OTHER): Payer: Medicare Other | Admitting: Family Medicine

## 2022-11-08 VITALS — BP 115/68 | HR 59 | Ht 70.0 in | Wt 223.0 lb

## 2022-11-08 DIAGNOSIS — Z8679 Personal history of other diseases of the circulatory system: Secondary | ICD-10-CM

## 2022-11-08 DIAGNOSIS — R42 Dizziness and giddiness: Secondary | ICD-10-CM | POA: Diagnosis not present

## 2022-11-08 DIAGNOSIS — R7309 Other abnormal glucose: Secondary | ICD-10-CM | POA: Diagnosis not present

## 2022-11-08 DIAGNOSIS — E871 Hypo-osmolality and hyponatremia: Secondary | ICD-10-CM

## 2022-11-08 NOTE — Progress Notes (Signed)
Established Patient Office Visit  Subjective   Patient ID: Osher Thu, male    DOB: July 10, 1950  Age: 72 y.o. MRN: [id number]  Chief Complaint  Patient presents with   Hypertension    HPI Hypertension- Pt denies chest pain, SOB, dizziness, or heart palpitations.  Taking meds as directed w/o problems.  Denies medication side effects.    Had a bad episodes of vertigo on Saturday when putting some food up.  Been going to physical therapy the symptoms started about 5 weeks ago and he seemed to be getting better and PT was helping some though sometimes it would aggravate his symptoms for about 24 hours.  He went for his last session last Wednesday.  But then Saturday had an episode where he bent over to put water in the dog's bowl and then started to feel that vertigo again.  It was quite intense the more severe symptoms lasted about 5 minutes and then it gradually declined.  He has been taking meclizine since then and each day has been a little bit better.     ROS    Objective:     BP 115/68   Pulse (!) 59   Ht 5\' 10"  (1.778 m)   Wt 223 lb (101.2 kg)   SpO2 96%   BMI 32.00 kg/m    Physical Exam Constitutional:      Appearance: He is well-developed.  HENT:     Head: Normocephalic and atraumatic.     Right Ear: External ear normal.     Left Ear: Tympanic membrane, ear canal and external ear normal.     Ears:     Comments: Right TM blocked by cerumen Neck:     Vascular: No carotid bruit.  Cardiovascular:     Rate and Rhythm: Normal rate and regular rhythm.     Heart sounds: Normal heart sounds.  Pulmonary:     Effort: Pulmonary effort is normal.     Breath sounds: Normal breath sounds.  Skin:    General: Skin is warm and dry.  Neurological:     Mental Status: He is alert and oriented to person, place, and time.  Psychiatric:        Behavior: Behavior normal.      No results found for any visits on 11/08/22.    The ASCVD Risk score (Arnett DK, et al.,  2019) failed to calculate for the following reasons:   The patient has a prior MI or stroke diagnosis    Assessment & Plan:   Problem List Items Addressed This Visit   None Visit Diagnoses     Dizziness    -  Primary   Relevant Orders   US Carotid Duplex Bilateral   COMPLETE METABOLIC PANEL WITH GFR   Hemoglobin A1c   History of CAD (coronary artery disease)       Relevant Orders   US Carotid Duplex Bilateral   Hyponatremia       Relevant Orders   COMPLETE METABOLIC PANEL WITH GFR   Hemoglobin A1c   Abnormal glucose       Relevant Orders   COMPLETE METABOLIC PANEL WITH GFR   Hemoglobin A1c      Dizziness-he has had some benefit with formal vestibular rehab.  Unfortunately had a setback this weekend with an acute episode that was quite intense.  We did discuss since he does have a known history of coronary artery disease to evaluate his carotids as well so we will get carotid Dopplers  for further workup.  Hyponatremia-plan to recheck sodium levels today.  Normal glucose noted in the labs at the emergency department so we will check A1c today.  No follow-ups on file.    Nani Gasser, MD

## 2022-11-09 LAB — COMPLETE METABOLIC PANEL WITH GFR
AG Ratio: 1.9 (calc) (ref 1.0–2.5)
ALT: 23 U/L (ref 9–46)
AST: 23 U/L (ref 10–35)
Albumin: 4.1 g/dL (ref 3.6–5.1)
Alkaline phosphatase (APISO): 89 U/L (ref 35–144)
BUN: 21 mg/dL (ref 7–25)
CO2: 28 mmol/L (ref 20–32)
Calcium: 9.2 mg/dL (ref 8.6–10.3)
Chloride: 103 mmol/L (ref 98–110)
Creat: 1.08 mg/dL (ref 0.70–1.28)
Globulin: 2.2 g/dL (calc) (ref 1.9–3.7)
Glucose, Bld: 83 mg/dL (ref 65–99)
Potassium: 4.6 mmol/L (ref 3.5–5.3)
Sodium: 139 mmol/L (ref 135–146)
Total Bilirubin: 0.5 mg/dL (ref 0.2–1.2)
Total Protein: 6.3 g/dL (ref 6.1–8.1)
eGFR: 74 mL/min/{1.73_m2} (ref >=60)

## 2022-11-09 LAB — HEMOGLOBIN A1C
Hgb A1c MFr Bld: 5.5 % of total Hgb (ref <5.7)
Mean Plasma Glucose: 111 mg/dL
eAG (mmol/L): 6.2 mmol/L

## 2022-11-09 NOTE — Progress Notes (Signed)
Your lab work is within acceptable range and there are no concerning findings.   ?

## 2022-11-10 ENCOUNTER — Other Ambulatory Visit: Payer: Self-pay | Admitting: Family Medicine

## 2022-11-10 DIAGNOSIS — I10 Essential (primary) hypertension: Secondary | ICD-10-CM

## 2022-11-15 ENCOUNTER — Ambulatory Visit (HOSPITAL_BASED_OUTPATIENT_CLINIC_OR_DEPARTMENT_OTHER)
Admission: RE | Admit: 2022-11-15 | Discharge: 2022-11-15 | Disposition: A | Discharge status: Home / Self Care | Payer: Medicare Other | Source: Ambulatory Visit | Attending: Family Medicine | Admitting: Family Medicine

## 2022-11-15 DIAGNOSIS — Z8679 Personal history of other diseases of the circulatory system: Secondary | ICD-10-CM | POA: Diagnosis not present

## 2022-11-15 DIAGNOSIS — R42 Dizziness and giddiness: Secondary | ICD-10-CM | POA: Diagnosis not present

## 2022-11-15 DIAGNOSIS — I6523 Occlusion and stenosis of bilateral carotid arteries: Secondary | ICD-10-CM | POA: Diagnosis not present

## 2022-11-15 NOTE — Progress Notes (Signed)
[place], ultrasound shows some mild plaque in the right carotid and minimal plaque in the left carotid.  This is not enough to cause your symptoms and does not require any type of surgical intervention at this point.  Sinew atorvastatin to reduce risk for additional plaque formation.  Next step would be to refer you to neurology if you would like.  Just let me know if you have a preference for provider or location.

## 2022-11-16 ENCOUNTER — Encounter: Payer: Self-pay | Admitting: Family Medicine

## 2022-11-19 DIAGNOSIS — M9903 Segmental and somatic dysfunction of lumbar region: Secondary | ICD-10-CM | POA: Diagnosis not present

## 2022-11-19 DIAGNOSIS — M5451 Vertebrogenic low back pain: Secondary | ICD-10-CM | POA: Diagnosis not present

## 2022-11-19 DIAGNOSIS — M542 Cervicalgia: Secondary | ICD-10-CM | POA: Diagnosis not present

## 2022-11-19 DIAGNOSIS — M9901 Segmental and somatic dysfunction of cervical region: Secondary | ICD-10-CM | POA: Diagnosis not present

## 2023-01-17 DIAGNOSIS — H2513 Age-related nuclear cataract, bilateral: Secondary | ICD-10-CM | POA: Diagnosis not present

## 2023-02-14 ENCOUNTER — Ambulatory Visit (INDEPENDENT_AMBULATORY_CARE_PROVIDER_SITE_OTHER): Payer: Medicare Other | Admitting: Family Medicine

## 2023-02-14 ENCOUNTER — Encounter: Payer: Self-pay | Admitting: Family Medicine

## 2023-02-14 VITALS — BP 116/72 | HR 62 | Ht 70.0 in | Wt 221.0 lb

## 2023-02-14 DIAGNOSIS — M9903 Segmental and somatic dysfunction of lumbar region: Secondary | ICD-10-CM | POA: Diagnosis not present

## 2023-02-14 DIAGNOSIS — H81399 Other peripheral vertigo, unspecified ear: Secondary | ICD-10-CM | POA: Diagnosis not present

## 2023-02-14 DIAGNOSIS — I10 Essential (primary) hypertension: Secondary | ICD-10-CM

## 2023-02-14 DIAGNOSIS — H8112 Benign paroxysmal vertigo, left ear: Secondary | ICD-10-CM | POA: Diagnosis not present

## 2023-02-14 DIAGNOSIS — M5451 Vertebrogenic low back pain: Secondary | ICD-10-CM | POA: Diagnosis not present

## 2023-02-14 DIAGNOSIS — M9901 Segmental and somatic dysfunction of cervical region: Secondary | ICD-10-CM | POA: Diagnosis not present

## 2023-02-14 DIAGNOSIS — M542 Cervicalgia: Secondary | ICD-10-CM | POA: Diagnosis not present

## 2023-02-14 DIAGNOSIS — B356 Tinea cruris: Secondary | ICD-10-CM

## 2023-02-14 MED ORDER — TERBINAFINE HCL 1 % EX CREA
1.0000 | TOPICAL_CREAM | Freq: Every day | CUTANEOUS | 1 refills | Status: DC
Start: 2023-02-14 — End: 2023-10-11

## 2023-02-14 NOTE — Progress Notes (Signed)
Acute Office Visit  Subjective:     Patient ID: Joshua Warner, male    DOB: 1950/10/09, 72 y.o.   MRN: [id number]  Chief Complaint  Patient presents with   Dizziness    Pt reports that he fell on Friday. He stated that he fell backwards doesn't feel like he hit his head. He stated that his sxs were worse on yesterday but better today.    HPI Patient is in today for vertigo. Has had vertigo on and off for several months.  Last couple of weeks noticed when tilts head back on the lounge chair and turns to the left and then tries to sit up will have vertigo.  Friday, he was out trying to clean the dryer lint outside and it is on an incline.  He was tilting his head back and up while trying to do the work and then started to lose his balance he stepped backwards but still ended up falling straight on his back.  He ended up seeing his chiropractor today and that has helped with his low back pain and a little bit with his neck.    He is taking the meclizine which does seem to help.  ROS      Objective:    BP 116/72   Pulse 62   Ht 5\' 10"  (1.778 m)   Wt 221 lb (100.2 kg)   SpO2 96%   BMI 31.71 kg/m    Physical Exam Constitutional:      Appearance: He is well-developed.  HENT:     Head: Normocephalic and atraumatic.  Cardiovascular:     Rate and Rhythm: Normal rate and regular rhythm.     Heart sounds: Normal heart sounds.  Pulmonary:     Effort: Pulmonary effort is normal.     Breath sounds: Normal breath sounds.  Skin:    General: Skin is warm and dry.  Neurological:     Mental Status: He is alert and oriented to person, place, and time.     Comments: Positive Dix-Hallpike maneuver to the left   Psychiatric:        Behavior: Behavior normal.     No results found for any visits on 02/14/23.      Assessment & Plan:   Problem List Items Addressed This Visit       Cardiovascular and Mediastinum   HYPERTENSION, BENIGN    BP at goal today.         Nervous  and Auditory   BPPV (benign paroxysmal positional vertigo), left - Primary   Other Visit Diagnoses     Other peripheral vertigo, unspecified ear       Relevant Orders   MR ANGIO HEAD WO W CONTRAST   Jock itch       Relevant Medications   terbinafine (ATHLETES FOOT, TERBINAFINE,) 1 % cream       We were able to recreate his vertigo with the Dix-Hallpike maneuver to the left.  But I am concerned he continues to have persistent symptoms are often triggered by head extension.  Would like to do further workup to rule out vestibular artery obstruction.  Will order MR angio head for further evaluation.  Sinew with home treatment maneuvers.  Meds ordered this encounter  Medications   terbinafine (ATHLETES FOOT, TERBINAFINE,) 1 % cream    Sig: Apply 1 Application topically daily. To Groin area x 3-4 weeks.    Dispense:  30 g    Refill:  1  No follow-ups on file.  Nani Gasser, MD

## 2023-02-14 NOTE — Progress Notes (Signed)
   Acute Office Visit  Subjective:     Patient ID: Joshua Warner, male    DOB: August 21, 1950, 72 y.o.   MRN: [id number]  No chief complaint on file.   HPI Patient is in today for vertigo. WAs s  ROS      Objective:    There were no vitals taken for this visit.   Physical Exam  No results found for any visits on 02/14/23.      Assessment & Plan:   Problem List Items Addressed This Visit   None   No orders of the defined types were placed in this encounter.   No follow-ups on file.  Nani Gasser, MD

## 2023-02-14 NOTE — Assessment & Plan Note (Signed)
BP at goal today.

## 2023-02-15 ENCOUNTER — Telehealth: Payer: Self-pay | Admitting: Family Medicine

## 2023-02-15 DIAGNOSIS — H81399 Other peripheral vertigo, unspecified ear: Secondary | ICD-10-CM

## 2023-02-15 NOTE — Telephone Encounter (Signed)
Call patient and let him know that we are unable to get the CT angio approved with his insurance.  So neck step would be to refer him to a neurologist.  If he is okay with this then please let me know and we can place referral.

## 2023-02-16 NOTE — Telephone Encounter (Signed)
Spoke with pt and he is aware. Would like to have a referral for neuro. Thanks Roselyn Reef, CMA

## 2023-02-16 NOTE — Telephone Encounter (Signed)
Orders Placed This Encounter  Procedures   Ambulatory referral to Neurology    Referral Priority:   Routine    Referral Type:   Consultation    Referral Reason:   Specialty Services Required    Requested Specialty:   Neurology    Number of Visits Requested:   1    

## 2023-02-16 NOTE — Telephone Encounter (Signed)
m °

## 2023-02-17 NOTE — Telephone Encounter (Signed)
Pt notified. Roselyn Reef, CMA

## 2023-02-21 DIAGNOSIS — H524 Presbyopia: Secondary | ICD-10-CM | POA: Diagnosis not present

## 2023-02-23 ENCOUNTER — Ambulatory Visit: Payer: Medicare Other | Admitting: Neurology

## 2023-02-23 ENCOUNTER — Encounter: Payer: Self-pay | Admitting: Neurology

## 2023-02-23 ENCOUNTER — Telehealth: Payer: Self-pay | Admitting: Neurology

## 2023-02-23 VITALS — Ht 70.0 in | Wt 225.4 lb

## 2023-02-23 DIAGNOSIS — H8112 Benign paroxysmal vertigo, left ear: Secondary | ICD-10-CM | POA: Diagnosis not present

## 2023-02-23 DIAGNOSIS — G450 Vertebro-basilar artery syndrome: Secondary | ICD-10-CM | POA: Diagnosis not present

## 2023-02-23 DIAGNOSIS — R42 Dizziness and giddiness: Secondary | ICD-10-CM | POA: Diagnosis not present

## 2023-02-23 DIAGNOSIS — R55 Syncope and collapse: Secondary | ICD-10-CM

## 2023-02-23 DIAGNOSIS — I6350 Cerebral infarction due to unspecified occlusion or stenosis of unspecified cerebral artery: Secondary | ICD-10-CM

## 2023-02-23 DIAGNOSIS — H539 Unspecified visual disturbance: Secondary | ICD-10-CM

## 2023-02-23 DIAGNOSIS — G43109 Migraine with aura, not intractable, without status migrainosus: Secondary | ICD-10-CM

## 2023-02-23 DIAGNOSIS — G463 Brain stem stroke syndrome: Secondary | ICD-10-CM

## 2023-02-23 DIAGNOSIS — R270 Ataxia, unspecified: Secondary | ICD-10-CM

## 2023-02-23 DIAGNOSIS — I679 Cerebrovascular disease, unspecified: Secondary | ICD-10-CM

## 2023-02-23 NOTE — Telephone Encounter (Signed)
CTA head [employer] medicare auth: [id number] exp. 02/23/23-03/24/23  MRI brain [employer] medicare auth: [id number] exp. 02/23/23-03/24/23 sent to GI [phone]

## 2023-02-23 NOTE — Progress Notes (Signed)
[facility]    Provider:  Dr Lucia Gaskins Requesting Provider: Agapito Games, * Primary Care Provider:  Agapito Games, MD  CC:  vertigo  HPI:  Joshua Warner is a 72 y.o. male here as requested by Agapito Games, * for peripheral vertigo, unspecified ear, diagnosed with BPPV on the left in July appointment ordered MR angio of the head, they were able to recreate his vertigo with a Dix-Hallpike to the left,. He has a PMHX of hypertension, coronary artery disease, STEMI, complete heart block, Purpura senilis, hypogonadism, BPPV, extensor intersection syndrome of both wrists, Baker's cyst in the left, subscapularis tendinitis of the left shoulder, primary osteoarthritis of the left hand and first metacarpal joint of the right foot, greater trochanteric bursitis, erectile dysfunction, hyperlipidemia, syncope, venous stasis, status post drug-eluting coronary artery stent, left wrist injury.  I reviewed Dr. Shelah Lewandowsky notes he was last seen for vertigo.  He was sent here for possible peripheral vertigo, unspecified ear, in July he also saw Dr. Linford Arnold with dizziness, he fell, he stated that he fell backwards and does not feel like he hit his head, his symptoms were worse on 719 with vertigo, he has had vertigo on and off for several months,, when he tilts his head back on the lounge chair and turns to the left and then tries to sit up will have vertigo, going to chiropractor's has helped.  Meclizine does not seem to help.  She performed an MRA of the head due to vertigo with head extension to look for any vascular issues.  He is here alone, first had vertigo over 20 years ago, mild, used meclizine. 5 years later everything started spinning, after he looked up. If he closed his eyes only lasts a few seconds. For years was ok, 5 years ago he felt like he was being pulled but nothing spinning. Then April 4th this year he was at the gym, looking at his phoneand he scrolled  and felt movement and he looked up and room spinning. Always happens with extension of the neck to the left. Also has ocular migraine.dizziness, room spinning always with head extension and to the left. Vision changes. Feels dizzy with next extension and decreased vision. No nausea or vomiting. Has had syncopal episodes with head movements, neck pain. Vision loss. Ocular migraine. No other focal neurologic deficits, associated symptoms, inciting events or modifiable factors.  Dix Hallpike maneuver today with slight symptoms on the left.   Reviewed notes, labs and imaging from outside physicians, which showed :  US carotid: CLINICAL DATA:  Dizziness, hypertension and history of coronary artery disease.   EXAM: BILATERAL CAROTID DUPLEX ULTRASOUND   TECHNIQUE: Wallace Cullens scale imaging, color Doppler and duplex ultrasound were performed of bilateral carotid and vertebral arteries in the neck.   COMPARISON:  None Available.   FINDINGS: Criteria: Quantification of carotid stenosis is based on velocity parameters that correlate the residual internal carotid diameter with NASCET-based stenosis levels, using the diameter of the distal internal carotid lumen as the denominator for stenosis measurement.   The following velocity measurements were obtained:   RIGHT   ICA:  60/20 cm/sec   CCA:  81/15 cm/sec   SYSTOLIC ICA/CCA RATIO:  0.7   ECA:  49 cm/sec   LEFT   ICA:  52/17 cm/sec   CCA:  69/17 cm/sec   SYSTOLIC ICA/CCA RATIO:  0.8   ECA:  58 cm/sec   RIGHT CAROTID ARTERY: Mild partially calcified plaque at the level of the  carotid bulb and proximal right ICA. Estimated right ICA stenosis is less than 50%.   RIGHT VERTEBRAL ARTERY: Antegrade flow with normal waveform and velocity.   LEFT CAROTID ARTERY: Minimal plaque at the origin of the left ICA. Estimated left ICA stenosis is less than 50%.   LEFT VERTEBRAL ARTERY: Antegrade flow with normal waveform and velocity.    IMPRESSION: Mild plaque at the level of the right carotid bulb and proximal right ICA and minimal plaque at the level of the proximal left ICA. No significant carotid stenosis identified. Estimated bilateral ICA stenoses are less than 50%.    CT head 09/2022: NDICATION:Vertigo 780.40  TECHNIQUE:  CT HEAD WO CONTRAST  FINDINGS: CT HEAD Motion artifact. Negative for intracranial hemorrhage. No midline shift or acute mass effect. Ventricles, cisterns, and sulci are unremarkable.   Negative for hydrocephalus.   Mild periventricular and deep white matter hypodensities, statistically sequela of old small vessel ischemic disease. Visualized orbits are unremarkable. Visualized paranasal sinuses are clear. Mastoid air cells are clear. No displaced or depressed calvarial fracture identified. Procedure Note  Lambert Keto, MD - 09/30/2022 Formatting of this note might be different from the original.   INDICATION:Vertigo 780.40  TECHNIQUE:  CT HEAD WO CONTRAST  FINDINGS: CT HEAD Motion artifact. Negative for intracranial hemorrhage. No midline shift or acute mass effect. Ventricles, cisterns, and sulci are unremarkable.   Negative for hydrocephalus.   Mild periventricular and deep white matter hypodensities, statistically sequela of old small vessel ischemic disease. Visualized orbits are unremarkable. Visualized paranasal sinuses are clear. Mastoid air cells are clear. No displaced or depressed calvarial fracture identified.   IMPRESSION: CT HEAD No acute intracranial abnormality identified.  MRI brain in 05/2020: CLINICAL DATA:  Elevated blood pressure, recent episode of left arm weakness   EXAM: MRI HEAD WITHOUT AND WITH CONTRAST   TECHNIQUE: Multiplanar, multiecho pulse sequences of the brain and surrounding structures were obtained without and with intravenous contrast.   CONTRAST:  10mL GADAVIST GADOBUTROL 1 MMOL/ML IV SOLN   COMPARISON:  None.    FINDINGS: Brain: There is no acute infarction or intracranial hemorrhage. There is no intracranial mass, mass effect, or edema. There is no hydrocephalus or extra-axial fluid collection. Ventricles and sulci are within normal limits in size and configuration. Patchy small foci of T2 hyperintensity in the supratentorial white matter are nonspecific but may reflect minor chronic microvascular ischemic changes. No abnormal enhancement.   Vascular: Major vessel flow voids at the skull base are preserved.   Skull and upper cervical spine: Normal marrow signal is preserved.   Sinuses/Orbits: Trace mucosal thickening.  Orbits are unremarkable.   Other: Sella is unremarkable.  Mastoid air cells are clear.   IMPRESSION: No evidence of recent infarction, hemorrhage, or mass. No abnormal enhancement.   Minor chronic microvascular ischemic changes.     Latest Ref Rng & Units 03/17/2021   12:00 AM 11/27/2019    9:12 AM 09/11/2019    7:23 AM  CBC  WBC 3.8 - 10.8 Thousand/uL 7.2  6.6  5.5   Hemoglobin 13.2 - 17.1 g/dL 16.1  09.6  04.5   Hematocrit 38.5 - 50.0 % 51.3  51.4  50.0   Platelets 140 - 400 Thousand/uL 210  200.0  204       Latest Ref Rng & Units 11/08/2022    1:41 PM 04/29/2022   12:00 AM 10/20/2021   12:00 AM  CMP  Glucose 65 - 99 mg/dL 83  82  94  BUN 7 - 25 mg/dL 21  16  20    Creatinine 0.70 - 1.28 mg/dL 1.61  0.96  0.45   Sodium 135 - 146 mmol/L 139  139  138   Potassium 3.5 - 5.3 mmol/L 4.6  4.8  4.8   Chloride 98 - 110 mmol/L 103  105  102   CO2 20 - 32 mmol/L 28  29  29    Calcium 8.6 - 10.3 mg/dL 9.2  9.4  9.5   Total Protein 6.1 - 8.1 g/dL 6.3   6.4   Total Bilirubin 0.2 - 1.2 mg/dL 0.5   0.9   AST 10 - 35 U/L 23   31   ALT 9 - 46 U/L 23   29      Review of Systems: Patient complains of symptoms per HPI as well as the following symptoms none. Pertinent negatives and positives per HPI. All others negative.   Social History   Socioeconomic History   Marital  status: Married    Spouse name: Peggy   Number of children: 2   Years of education: 15   Highest education level: Some college, no degree  Occupational History   Occupation: Retired    Associate Professor: [employer]  Tobacco Use   Smoking status: Never   Smokeless tobacco: Never  Vaping Use   Vaping status: Never Used  Substance and Sexual Activity   Alcohol use: Yes    Alcohol/week: 1.0 standard drink of alcohol    Types: 1 Glasses of wine per week    Comment: Less than one drink per month   Drug use: Never   Sexual activity: Not Currently    Partners: Female    Birth control/protection: None    Comment: 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  Other Topics Concern   Not on file  Social History Narrative   Lives with his wife and their daughter. He enjoys reading, watching t.v. and going to the gym.   Social Determinants of Health   Financial Resource Strain: Low Risk  (10/24/2022)   Overall Financial Resource Strain (CARDIA)    Difficulty of Paying Living Expenses: Not very hard  Food Insecurity: No Food Insecurity (10/24/2022)   Hunger Vital Sign    Worried About Running Out of Food in the Last Year: Never true    Ran Out of Food in the Last Year: Never true  Transportation Needs: No Transportation Needs (10/24/2022)   PRAPARE - Administrator, Civil Service (Medical): No    Lack of Transportation (Non-Medical): No  Physical Activity: Insufficiently Active (10/24/2022)   Exercise Vital Sign    Days of Exercise per Week: 3 days    Minutes of Exercise per Session: 30 min  Stress: No Stress Concern Present (10/24/2022)   [employer] of Occupational Health - Occupational Stress Questionnaire    Feeling of Stress : Not at all  Social Connections: Unknown (10/24/2022)   Social Connection and Isolation Panel [NHANES]    Frequency of Communication with Friends and Family:  Patient declined    Frequency of Social Gatherings with Friends and Family: Patient declined    Attends Religious Services: More than 4 times per year    Active Member of [employer] or Organizations: Yes    Attends Engineer, structural: More than 4 times per year    Marital Status: Married  Catering manager Violence: Not At  Risk (09/30/2022)   [facility]   HITS    Over the last 12 months how often did your partner physically hurt you?: 1    Over the last 12 months how often did your partner insult you or talk down to you?: 1    Over the last 12 months how often did your partner threaten you with physical harm?: 1    Over the last 12 months how often did your partner scream or curse at you?: 1    Family History  Problem Relation Age of Onset   Heart disease Father    Hypertension Mother    Arthritis Mother    Prostate cancer Maternal Uncle        Deceased   Depression Maternal Uncle    Cancer Paternal Grandfather    Cancer Paternal Grandmother    Colon cancer Neg Hx    Stomach cancer Neg Hx    Colon polyps Neg Hx    Esophageal cancer Neg Hx     Past Medical History:  Diagnosis Date   Arthritis    CHF (congestive heart failure) (HCC)    stents   Coronary artery disease    stents   GERD (gastroesophageal reflux disease)    Hyperlipidemia    Hypertension since 2007   Kidney stone 2022   Kidney stones 2009   Myocardial infarction (HCC) 12/2015   Oxygen deficiency    only during heartattack    Patient Active Problem List   Diagnosis Date Noted   BPPV (benign paroxysmal positional vertigo), left 02/14/2023   Nondisplaced right eighth rib fracture 06/30/2022   Left wrist injury 06/30/2022   Greater trochanteric bursitis, left 01/29/2022   Actinic keratosis 03/21/2021   Kidney stone 03/21/2021   Family history of factor V deficiency 09/15/2020   Purpura senilis (HCC) 03/17/2020   Proteins serum plasma low 03/17/2020   Venous stasis  01/28/2020   Schatzki's ring 09/10/2019   Osteoarthritis of first metatarsophalangeal (MTP) joint of right foot 05/24/2017   Primary osteoarthritis of left hand 12/10/2016   Coronary artery disease involving native coronary artery of native heart without angina pectoris 01/03/2016   Status post insertion of drug eluting coronary artery stent 01/03/2016   STEMI (ST elevation myocardial infarction) (HCC) 01/03/2016   Complete heart block (HCC) 01/03/2016   Syncope 01/03/2016   Subscapularis tendinitis of left shoulder 10/25/2014   Hyperlipidemia 08/12/2014   Extensor intersection syndrome of both wrists 08/02/2013   Cough 08/02/2013   Allergic reaction 02/08/2013   Baker's cyst, left 01/11/2013   Solitary pulmonary nodule 11/14/2012   Arthritis of carpometacarpal joint 10/17/2012   HYPOGONADISM 06/04/2009   HYPERTENSION, BENIGN 06/04/2009   GERD 06/04/2009   ERECTILE DYSFUNCTION, ORGANIC 06/04/2009    Past Surgical History:  Procedure Laterality Date   CORONARY ANGIOPLASTY WITH STENT PLACEMENT  12/2015   KNEE ARTHROSCOPY  12/31/2010   Right knee    TONSILLECTOMY  1957    Current Outpatient Medications  Medication Sig Dispense Refill   ASPIRIN ADULT PO Take 81 mg by mouth.      atorvastatin (LIPITOR) 80 MG tablet Take 80 mg by mouth at bedtime.     b complex vitamins tablet Take 1 tablet by mouth daily.     Cholecalciferol 125 MCG (5000 UT) capsule Take 5,000 Units by mouth daily.     Glucosamine-Chondroit-Vit C-Mn (GLUCOSAMINE CHONDR 1500 COMPLX) CAPS Take by mouth daily.     loratadine (CLARITIN) 10 MG tablet Take  10 mg by mouth daily.     losartan (COZAAR) 100 MG tablet TAKE 1 TABLET BY MOUTH EVERY DAY 90 tablet 1   meclizine (ANTIVERT) 25 MG tablet Take 25 mg by mouth every 6 (six) hours as needed for dizziness.     metoprolol tartrate (LOPRESSOR) 25 MG tablet Take 12.5 mg by mouth 2 (two) times daily.     Multiple Vitamin (MULTIVITAMIN) tablet Take 1 tablet by mouth daily.      nitroGLYCERIN (NITROSTAT) 0.4 MG SL tablet Place 1 tablet (0.4 mg total) under the tongue every 5 (five) minutes as needed. 25 tablet 1   ondansetron (ZOFRAN-ODT) 4 MG disintegrating tablet Take 4 mg by mouth every 6 (six) hours as needed for nausea or vomiting.     pantoprazole (PROTONIX) 40 MG tablet Take 1 tablet (40 mg total) by mouth daily. Call [phone] to schedule an office visit for more refills 30 tablet 2   terbinafine (ATHLETES FOOT, TERBINAFINE,) 1 % cream Apply 1 Application topically daily. To Groin area x 3-4 weeks. 30 g 1   vitamin C (ASCORBIC ACID) 500 MG tablet Take 1,000 mg by mouth daily.     No current facility-administered medications for this visit.    Allergies as of 02/23/2023 - Review Complete 02/23/2023  Allergen Reaction Noted   Celebrex [celecoxib]  06/04/2009   Lisinopril Other (See Comments) 03/13/2019   Sulfa antibiotics  01/03/2016   Sulfonamide derivatives  06/04/2009   Tamsulosin Itching and Rash 03/20/2021    Vitals: Ht 5\' 10"  (1.778 m)   Wt 225 lb 6.4 oz (102.2 kg)   BMI 32.34 kg/m  Last Weight:  Wt Readings from Last 1 Encounters:  02/23/23 225 lb 6.4 oz (102.2 kg)   Last Height:   Ht Readings from Last 1 Encounters:  02/23/23 5\' 10"  (1.778 m)   Orthostatic BP 134/80 136/84 119/82  BP Location Left Arm Left Arm Left Arm  Patient Position Supine Sitting Standing  Cuff Size Normal Normal Normal  Orthostatic Pulse 60 61   Weight 225 lb 6.4 oz (102.2 kg)    Height 5\' 10"  (1.778 m)      Physical exam: Exam: Gen: NAD, conversant, well nourised, obese, well groomed                     CV: RRR, no MRG. No Carotid Bruits. No peripheral edema, warm, nontender Eyes: Conjunctivae clear without exudates or hemorrhage  Neuro: Detailed Neurologic Exam  Speech:    Speech is normal; fluent and spontaneous with normal comprehension.  Cognition:    The patient is oriented to person, place, and time;     recent and remote memory intact;      language fluent;     normal attention, concentration,     fund of knowledge Cranial Nerves:    The pupils are equal, round, and reactive to light. ONH without edema Visual fields are full to finger confrontation. Extraocular movements are intact. Trigeminal sensation is intact and the muscles of mastication are normal. The face is symmetric. The palate elevates in the midline. Hearing intact. Voice is normal. Shoulder shrug is normal. The tongue has normal motion without fasciculations.   Coordination:    Normal finger to nose and heel to shin. Normal rapid alternating movements.   Gait:    Heel-toe and tandem gait are normal.   Motor Observation:    No asymmetry, no atrophy, and no involuntary movements noted. Tone:    Normal muscle  tone.    Posture:    Posture is normal. normal erect    Strength:    Strength is V/V in the upper and lower limbs.      Sensation: intact to LT     Reflex Exam:  DTR's:    Deep tendon reflexes in the upper and lower extremities are normal bilaterally.   Toes:    The toes are downgoing bilaterally.   Clonus:    Clonus is absent.    Assessment/Plan:  Patient with episodes of vertigo but needs thorough evaluation due to concerning symptoms  Dizziness,vertigo, vision changes with head extension need to evaluate for posterior vascular insufficiency, stroke CTA head CT with contrast to look at arteries since he has vision changes and vertigo with head extension also look for aneurysm has pulsatile tinnitus MRI brain w/wo contrast for stroke or schwannoma or mass - IAC and stroke protocols Vestibular therapy [place], requests c weaver If no improvement, may consider cervicogenic dizziness and MR cervical spine(chronic pain, decreased ROM)  Orders Placed This Encounter  Procedures   CT ANGIO HEAD W OR WO CONTRAST   MR BRAIN W WO CONTRAST   Basic Metabolic Panel   Ambulatory referral to Physical Therapy   No orders of the defined types  were placed in this encounter.   Cc: Agapito Games, *,  Agapito Games, MD  Naomie Dean, MD  [facility] [address] [place], [place] [address]  Phone [phone] Fax [phone]

## 2023-02-23 NOTE — Patient Instructions (Addendum)
CTA head CT with contrast to look at arteries MRI brain w/wo contrast for stroke or schwannoma or mass Vestibular therapy [place], requests c weaver If no improvement, may consider cervicogenic dizziness and MR cervical spine(chronic pain, decreased ROM)  Orders Placed This Encounter  Procedures   CT ANGIO HEAD W OR WO CONTRAST   MR BRAIN W WO CONTRAST   Basic Metabolic Panel   Ambulatory referral to Physical Therapy    Benign Positional Vertigo Vertigo is the feeling that you or your surroundings are moving when they are not. Benign positional vertigo is the most common form of vertigo. This is usually a harmless condition (benign). This condition is positional. This means that symptoms are triggered by certain movements and positions. This condition can be dangerous if it occurs while you are doing something that could cause harm to yourself or others. This includes activities such as driving or operating machinery. What are the causes? The inner ear has fluid-filled canals that help your brain sense movement and balance. When the fluid moves, the brain receives messages about your body's position. With benign positional vertigo, calcium crystals in the inner ear break free and disturb the inner ear area. This causes your brain to receive confusing messages about your body's position. What increases the risk? You are more likely to develop this condition if: You are a woman. You are 58 years of age or older. You have recently had a head injury. You have an inner ear disease. What are the signs or symptoms? Symptoms of this condition usually happen when you move your head or your eyes in different directions. Symptoms may start suddenly and usually last for less than a minute. They include: Loss of balance and falling. Feeling like you are spinning or moving. Feeling like your surroundings are spinning or moving. Nausea and vomiting. Blurred vision. Dizziness. Involuntary eye  movement (nystagmus). Symptoms can be mild and cause only minor problems, or they can be severe and interfere with daily life. Episodes of benign positional vertigo may return (recur) over time. Symptoms may also improve over time. How is this diagnosed? This condition may be diagnosed based on: Your medical history. A physical exam of the head, neck, and ears. Positional tests to check for or stimulate vertigo. You may be asked to turn your head and change positions, such as going from sitting to lying down. A health care provider will watch for symptoms of vertigo. You may be referred to a health care provider who specializes in ear, nose, and throat problems (ENT or otolaryngologist) or a provider who specializes in disorders of the nervous system (neurologist). How is this treated?  This condition may be treated in a session in which your health care provider moves your head in specific positions to help the displaced crystals in your inner ear move. Treatment for this condition may take several sessions. Surgery may be needed in severe cases, but this is rare. In some cases, benign positional vertigo may resolve on its own in 2-4 weeks. Follow these instructions at home: Safety Move slowly. Avoid sudden body or head movements or certain positions, as told by your health care provider. Avoid driving or operating machinery until your health care provider says it is safe. Avoid doing any tasks that would be dangerous to you or others if vertigo occurs. If you have trouble walking or keeping your balance, try using a cane for stability. If you feel dizzy or unstable, sit down right away. Return to your normal  activities as told by your health care provider. Ask your health care provider what activities are safe for you. General instructions Take over-the-counter and prescription medicines only as told by your health care provider. Drink enough fluid to keep your urine pale yellow. Keep all  follow-up visits. This is important. Contact a health care provider if: You have a fever. Your condition gets worse or you develop new symptoms. Your family or friends notice any behavioral changes. You have nausea or vomiting that gets worse. You have numbness or a prickling and tingling sensation. Get help right away if you: Have difficulty speaking or moving. Are always dizzy or faint. Develop severe headaches. Have weakness in your legs or arms. Have changes in your hearing or vision. Develop a stiff neck. Develop sensitivity to light. These symptoms may represent a serious problem that is an emergency. Do not wait to see if the symptoms will go away. Get medical help right away. Call your local emergency services (911 in the [place] Do not drive yourself to the hospital. Summary Vertigo is the feeling that you or your surroundings are moving when they are not. Benign positional vertigo is the most common form of vertigo. This condition is caused by calcium crystals in the inner ear that become displaced. This causes a disturbance in an area of the inner ear that helps your brain sense movement and balance. Symptoms include loss of balance and falling, feeling that you or your surroundings are moving, nausea and vomiting, and blurred vision. This condition can be diagnosed based on symptoms, a physical exam, and positional tests. Follow safety instructions as told by your health care provider and keep all follow-up visits. This is important. This information is not intended to replace advice given to you by your health care provider. Make sure you discuss any questions you have with your health care provider. Document Revised: 05/14/2020 Document Reviewed: 05/14/2020 Elsevier Patient Education  2024 [employer]  Vertigo Vertigo is the feeling that you or the things around you are moving or spinning when they're not. It's different than feeling dizzy. It can also cause: Loss of  balance. Trouble standing or walking. Nausea and vomiting. This feeling can come and go at any time. It can last from a few seconds to minutes or even hours. It may go away on its own or be treated with medicine. What are the types of vertigo? There are two types of vertigo: Peripheral vertigo happens when parts of your inner ear don't work like they should. This is the more common type. Central vertigo happens when your brain and spinal cord don't work like they should. Your health care provider will do tests to find out what kind of vertigo you have. This will help them decide on the right treatment for you. Follow these instructions at home: Eating and drinking Drink enough fluid to keep your pee (urine) pale yellow. Do not drink alcohol. Activity When you get up in the morning, first sit up on the side of the bed. When you feel okay, stand slowly while holding onto something. Move slowly. Avoid sudden body or head movements. Avoid certain positions, as told by your provider. Use a cane if you have trouble standing or walking. Sit down right away if you feel unsteady. Place items in your home so they're easy for you to reach without bending or leaning over. Return to normal activities when you're told. Ask what things are safe for you to do. General instructions Take your medicines  only as told by your provider. Contact a health care provider if: Your medicines don't help or make your vertigo worse. You get new symptoms. You have a fever. You have nausea or vomiting. Your family or friends spot any changes in how you're acting. A part of your body goes numb. You feel tingling and prickling in a part of your body. You get very bad headaches. Get help right away if: You're always dizzy or you faint. You have a stiff neck. You have trouble moving or speaking. Your hands, arms, or legs feel weak. Your hearing or eyesight changes. These symptoms may be an emergency. Call 911 right  away. Do not wait to see if the symptoms will go away. Do not drive yourself to the hospital. This information is not intended to replace advice given to you by your health care provider. Make sure you discuss any questions you have with your health care provider. Document Revised: 09/17/2022 Document Reviewed: 09/17/2022 Elsevier Patient Education  2024 [employer].

## 2023-02-24 DIAGNOSIS — M542 Cervicalgia: Secondary | ICD-10-CM | POA: Diagnosis not present

## 2023-02-24 DIAGNOSIS — M5451 Vertebrogenic low back pain: Secondary | ICD-10-CM | POA: Diagnosis not present

## 2023-02-24 DIAGNOSIS — M9901 Segmental and somatic dysfunction of cervical region: Secondary | ICD-10-CM | POA: Diagnosis not present

## 2023-02-24 DIAGNOSIS — M9903 Segmental and somatic dysfunction of lumbar region: Secondary | ICD-10-CM | POA: Diagnosis not present

## 2023-02-24 LAB — BASIC METABOLIC PANEL
BUN/Creatinine Ratio: 15 (ref 10–24)
BUN: 15 mg/dL (ref 8–27)
CO2: 22 mmol/L (ref 20–29)
Calcium: 9.6 mg/dL (ref 8.6–10.2)
Chloride: 103 mmol/L (ref 96–106)
Creatinine, Ser: 0.98 mg/dL (ref 0.76–1.27)
Glucose: 88 mg/dL (ref 70–99)
Potassium: 4.7 mmol/L (ref 3.5–5.2)
Sodium: 139 mmol/L (ref 134–144)
eGFR: 82 mL/min/{1.73_m2} (ref >59)

## 2023-03-02 ENCOUNTER — Other Ambulatory Visit: Payer: Self-pay

## 2023-03-02 ENCOUNTER — Ambulatory Visit: Payer: Medicare Other | Attending: Neurology | Admitting: Rehabilitative and Restorative Service Providers"

## 2023-03-02 ENCOUNTER — Encounter: Payer: Self-pay | Admitting: Rehabilitative and Restorative Service Providers"

## 2023-03-02 DIAGNOSIS — R42 Dizziness and giddiness: Secondary | ICD-10-CM | POA: Diagnosis not present

## 2023-03-02 DIAGNOSIS — H8112 Benign paroxysmal vertigo, left ear: Secondary | ICD-10-CM | POA: Insufficient documentation

## 2023-03-02 DIAGNOSIS — Z9181 History of falling: Secondary | ICD-10-CM | POA: Insufficient documentation

## 2023-03-02 NOTE — Therapy (Signed)
OUTPATIENT PHYSICAL THERAPY VESTIBULAR EVALUATION   Patient Name: Joshua Warner MRN: [id number] DOB:1950/10/30, 72 y.o., male Today's Date: 03/02/2023  END OF SESSION:  PT End of Session - 03/02/23 1406     Visit Number 1    Number of Visits 12    Date for PT Re-Evaluation 05/01/23    PT Start Time 1407    PT Stop Time 1448    PT Time Calculation (min) 41 min    Activity Tolerance Patient tolerated treatment well    Behavior During Therapy [facility] for tasks assessed/performed            Past Medical History:  Diagnosis Date   Arthritis    CHF (congestive heart failure) (HCC)    stents   Coronary artery disease    stents   GERD (gastroesophageal reflux disease)    Hyperlipidemia    Hypertension since 2007   Kidney stone 2022   Kidney stones 2009   Myocardial infarction (HCC) 12/2015   Oxygen deficiency    only during heartattack   Past Surgical History:  Procedure Laterality Date   CORONARY ANGIOPLASTY WITH STENT PLACEMENT  12/2015   KNEE ARTHROSCOPY  12/31/2010   Right knee    TONSILLECTOMY  1957   Patient Active Problem List   Diagnosis Date Noted   BPPV (benign paroxysmal positional vertigo), left 02/14/2023   Nondisplaced right eighth rib fracture 06/30/2022   Left wrist injury 06/30/2022   Greater trochanteric bursitis, left 01/29/2022   Actinic keratosis 03/21/2021   Kidney stone 03/21/2021   Family history of factor V deficiency 09/15/2020   Purpura senilis (HCC) 03/17/2020   Proteins serum plasma low 03/17/2020   Venous stasis 01/28/2020   Schatzki's ring 09/10/2019   Osteoarthritis of first metatarsophalangeal (MTP) joint of right foot 05/24/2017   Primary osteoarthritis of left hand 12/10/2016   Coronary artery disease involving native coronary artery of native heart without angina pectoris 01/03/2016   Status post insertion of drug eluting coronary artery stent 01/03/2016   STEMI (ST elevation myocardial infarction) (HCC) 01/03/2016   Complete  heart block (HCC) 01/03/2016   Syncope 01/03/2016   Subscapularis tendinitis of left shoulder 10/25/2014   Hyperlipidemia 08/12/2014   Extensor intersection syndrome of both wrists 08/02/2013   Cough 08/02/2013   Allergic reaction 02/08/2013   Baker's cyst, left 01/11/2013   Solitary pulmonary nodule 11/14/2012   Arthritis of carpometacarpal joint 10/17/2012   HYPOGONADISM 06/04/2009   HYPERTENSION, BENIGN 06/04/2009   GERD 06/04/2009   ERECTILE DYSFUNCTION, ORGANIC 06/04/2009    PCP: Nani Gasser, MD REFERRING PROVIDER: Anda Kraft, MD  REFERRING DIAG: [phone] (ICD-10-CM) - BPPV (benign paroxysmal positional vertigo), left H81.12 (ICD-10-CM) - Benign paroxysmal positional vertigo of left ear  THERAPY DIAG:  Dizziness and giddiness  ONSET DATE: 02/23/2023  Rationale for Evaluation and Treatment: Rehabilitation  SUBJECTIVE:   SUBJECTIVE STATEMENT: The patient reports he had a fall 3 weeks ago when trying to work on the dryer vent-- he got a little dizzy and had to walk backwards and ended up falling. Symptoms of dizziness are worse since falling "I think I stirred things up again." He notes that he has been tested with L dix hallpike and had symptoms reproduced with Dr. Linford Arnold. He has seen neurology and is getting a workup for vertebrobasilar insufficiency and MRI. He also sees a Land and got a spinning episode last week when lying down. Pt accompanied by: self  PERTINENT HISTORY: vertigo, hearing aids  PAIN:  Are you having  pain? No  PRECAUTIONS: Fall  WEIGHT BEARING RESTRICTIONS: No  FALLS: Has patient fallen in last 6 months? Yes. Number of falls 2, (1 April 4 at the gym and the recent one 3 weeks ago)   also had 1 before Christmas  LIVING ENVIRONMENT: Lives with: lives with their family Lives in: House/apartment  PLOF: Independent  PATIENT GOALS: reduce dizziness, improve balance   OBJECTIVE:   DIAGNOSTIC FINDINGS: Has schedule testing for  MRI, MRA in October.  IMPRESSION: Mild plaque at the level of the right carotid bulb and proximal right ICA and minimal plaque at the level of the proximal left ICA. No significant carotid stenosis identified. Estimated bilateral ICA stenoses are less than 50%.  SENSATION: Numbness does not occur with dizziness, but notes an episode of L hand not being able to lift while at work  Cervical ROM:  Not tested  GAIT: Gait pattern:  slowed speed, wider base of support Distance walked: 100 ft Assistive device utilized: None Level of assistance: Complete Independence  PATIENT SURVEYS:  FOTO --- did not test  VESTIBULAR ASSESSMENT:  GENERAL OBSERVATION: The patient walks into clinic slowed pace   SYMPTOM BEHAVIOR:  Subjective history: h/o recurring episodes of dizziness -- recently saw MD and had + L dix hallpike, is taking meclizine 2x/day at this time (took 1 hour ago)  Non-Vestibular symptoms:  ear popping  Type of dizziness: Imbalance (Disequilibrium) and Spinning/Vertigo  Frequency: a couple of times per week  Duration: lasts x seconds or until he moves his head   Aggravating factors:  lying supine with head to the left  Relieving factors: head stationary  Progression of symptoms: worse  OCULOMOTOR EXAM:  Ocular Alignment: normal  Ocular ROM: No Limitations  Spontaneous Nystagmus: absent  Gaze-Induced Nystagmus: absent  Smooth Pursuits: intact  Saccades: intact Progressive lenses donned   VESTIBULAR - OCULAR REFLEX:   Slow VOR: Normal  VOR Cancellation: Normal  Head-Impulse Test: Not tested today  Dynamic Visual Acuity:not tested today   POSITIONAL TESTING: Right Dix-Hallpike: no nystagmus and gets a sensation of lightheadedness with return to sitting. Left Dix-Hallpike: upbeating, left nystagmus *8 seconds, small amplitude nystagmus noted Right Roll Test: no nystagmus Left Roll Test: no nystagmus *Patient is moving slowly for all testing  MOTION  SENSITIVITY: Notes dizziness with rolling R<>L while moving that settles within seconds   [facility] Adult PT Treatment:                                                DATE: 03/02/23 Neuromuscular re-ed: Canalith Repositioning:  Epley Left: Number of Reps: 2 Habituation  Rolling R<>L x 3 reps Self Care: Patient is taking meclizine 2 times/day x the past couple of months  *Pt gets mild nausea and is hot/clammy-- measured BP/HR and patient rested before leaving clinic Vitals: HR=58 bpm BP=123/82  PATIENT EDUCATION: Education details: Ashby Dawes of condition,  HEP Person educated: Patient Education method: Explanation, Demonstration, and Handouts Education comprehension: verbalized understanding and returned demonstration  HOME EXERCISE PROGRAM: Access Code: [email or web address] URL: [email or web address] Date: 03/02/2023 Prepared by: Margretta Ditty  Exercises - Rolling rightleft sides for vestibular habituation  - 2 x daily - 7 x weekly - 1 sets - 3 reps  GOALS: Goals reviewed with patient? Yes  SHORT TERM GOALS: Target date: 04/01/23  The patient will be indep with HEP. Baseline: initiated at  eval Goal status: INITIAL  2.  The patient will report no dizziness with rolling R<>L in bed. Baseline:  Notes dizziness and nausea when rolling in clinic. Goal status: INITIAL  3.  The patient will have negative L dix hallpike testing. Baseline:  Small amplitude nystagmus viewed in room light. Goal status: INITIAL  LONG TERM GOALS: Target date: 05/01/23  The patient will be indep with HEP progression. Baseline:  Established at eval Goal status: INITIAL  2.  The patient will be further assessed for multi-sensory balance conditions. Baseline:  Has had 2 falls in past 6 months. Goal status: INITIAL  3.  The patient will be able to return to gym routine. Baseline:  Has not been able to go to gym due to vertigo symptoms. Goal status: INITIAL  ASSESSMENT:  CLINICAL  IMPRESSION: Patient is a 72 y.o. male who was seen today for physical therapy evaluation and treatment for vertigo. He presents with multiple impairments including + L dix hallpike (small amplitude-- took meclizine prior to eval, which may impact response), imbalance, slowed gait speed, and motion sensitivity with rolling. PT initiated treatment including habituation and canolith repositioning. Patient needed to rest after due to nausea. Vitals are WNLs. PT to address deficits and work to STGs/LTGs to improve functional mobility.    OBJECTIVE IMPAIRMENTS: decreased activity tolerance, decreased balance, and dizziness.   ACTIVITY LIMITATIONS: bending, bed mobility, and locomotion level  PARTICIPATION LIMITATIONS: community activity and gym routine  PERSONAL FACTORS: 1 comorbidity: h/o recurring vertigo  are also affecting patient's functional outcome.   REHAB POTENTIAL: Good  CLINICAL DECISION MAKING: Stable/uncomplicated  EVALUATION COMPLEXITY: Low   PLAN:  PT FREQUENCY: 1-2x/week  PT DURATION: 8 weeks  PLANNED INTERVENTIONS: Therapeutic exercises, Therapeutic activity, Neuromuscular re-education, Balance training, Gait training, Patient/Family education, Self Care, Joint mobilization, Vestibular training, and Canalith repositioning  PLAN FOR NEXT SESSION: Check HEP-- habituation, add multi-sensory balance, progress motion sensitivity and retest BPPV.   Deuntae Kocsis, PT 03/02/2023, 2:08 PM

## 2023-03-08 ENCOUNTER — Other Ambulatory Visit: Payer: Self-pay | Admitting: Family Medicine

## 2023-03-08 NOTE — Therapy (Unsigned)
OUTPATIENT PHYSICAL THERAPY VESTIBULAR TREATMENT   Patient Name: Joshua Warner MRN: [id number] DOB:03-29-1951, 72 y.o., male Today's Date: 03/09/2023  END OF SESSION:  PT End of Session - 03/09/23 1358     Visit Number 2    Number of Visits 12    Date for PT Re-Evaluation 05/01/23    Authorization Type [employer] medicare    Progress Note Due on Visit 10    PT Start Time 1402    PT Stop Time 1445    PT Time Calculation (min) 43 min    Activity Tolerance Patient tolerated treatment well    Behavior During Therapy WFL for tasks assessed/performed            Past Medical History:  Diagnosis Date   Arthritis    CHF (congestive heart failure) (HCC)    stents   Coronary artery disease    stents   GERD (gastroesophageal reflux disease)    Hyperlipidemia    Hypertension since 2007   Kidney stone 2022   Kidney stones 2009   Myocardial infarction (HCC) 12/2015   Oxygen deficiency    only during heartattack   Past Surgical History:  Procedure Laterality Date   CORONARY ANGIOPLASTY WITH STENT PLACEMENT  12/2015   KNEE ARTHROSCOPY  12/31/2010   Right knee    TONSILLECTOMY  1957   Patient Active Problem List   Diagnosis Date Noted   BPPV (benign paroxysmal positional vertigo), left 02/14/2023   Nondisplaced right eighth rib fracture 06/30/2022   Left wrist injury 06/30/2022   Greater trochanteric bursitis, left 01/29/2022   Actinic keratosis 03/21/2021   Kidney stone 03/21/2021   Family history of factor V deficiency 09/15/2020   Purpura senilis (HCC) 03/17/2020   Proteins serum plasma low 03/17/2020   Venous stasis 01/28/2020   Schatzki's ring 09/10/2019   Osteoarthritis of first metatarsophalangeal (MTP) joint of right foot 05/24/2017   Primary osteoarthritis of left hand 12/10/2016   Coronary artery disease involving native coronary artery of native heart without angina pectoris 01/03/2016   Status post insertion of drug eluting coronary artery stent 01/03/2016    STEMI (ST elevation myocardial infarction) (HCC) 01/03/2016   Complete heart block (HCC) 01/03/2016   Syncope 01/03/2016   Subscapularis tendinitis of left shoulder 10/25/2014   Hyperlipidemia 08/12/2014   Extensor intersection syndrome of both wrists 08/02/2013   Cough 08/02/2013   Allergic reaction 02/08/2013   Baker's cyst, left 01/11/2013   Solitary pulmonary nodule 11/14/2012   Arthritis of carpometacarpal joint 10/17/2012   HYPOGONADISM 06/04/2009   HYPERTENSION, BENIGN 06/04/2009   GERD 06/04/2009   ERECTILE DYSFUNCTION, ORGANIC 06/04/2009    PCP: Nani Gasser, MD REFERRING PROVIDER: Anda Kraft, MD REFERRING DIAG: [phone] (ICD-10-CM) - BPPV (benign paroxysmal positional vertigo), left H81.12 (ICD-10-CM) - Benign paroxysmal positional vertigo of left ear  THERAPY DIAG:  Dizziness and giddiness  BPPV (benign paroxysmal positional vertigo), left  ONSET DATE: 02/23/2023  Rationale for Evaluation and Treatment: Rehabilitation  SUBJECTIVE:   SUBJECTIVE STATEMENT:  The patient was able to return to the gym at the end of last week. He got dizziness when rolling to the left and right with habituation. He was noticing a severe spinning sensation. He did it 2x/day. He felt like the symptoms were bad, so he stopped doing the HEP.  Took meclizine this morning.    EVAL:The patient reports he had a fall 3 weeks ago when trying to work on the dryer vent-- he got a little dizzy and had to walk  backwards and ended up falling. Symptoms of dizziness are worse since falling "I think I stirred things up again." He notes that he has been tested with L dix hallpike and had symptoms reproduced with Dr. Linford Arnold. He has seen neurology and is getting a workup for vertebrobasilar insufficiency and MRI. He also sees a Land and got a spinning episode last week when lying down. Pt accompanied by: self  PERTINENT HISTORY: vertigo, hearing aids  PAIN: Are you having pain?  No  PRECAUTIONS: Fall  WEIGHT BEARING RESTRICTIONS: No  FALLS: Has patient fallen in last 6 months? Yes. Number of falls 2, (1 April 4 at the gym and the recent one 3 weeks ago)   also had 1 before Christmas  PATIENT GOALS: reduce dizziness, improve balance   OBJECTIVE:  (Measures in this section from initial evaluation unless otherwise noted) DIAGNOSTIC FINDINGS: Has schedule testing for MRI, MRA in October.  IMPRESSION: Mild plaque at the level of the right carotid bulb and proximal right ICA and minimal plaque at the level of the proximal left ICA. No significant carotid stenosis identified. Estimated bilateral ICA stenoses are less than 50%.  SENSATION: Numbness does not occur with dizziness, but notes an episode of L hand not being able to lift while at work  Cervical ROM:  Not tested  GAIT: Gait pattern:  slowed speed, wider base of support Distance walked: 100 ft Assistive device utilized: None Level of assistance: Complete Independence  PATIENT SURVEYS:  FOTO --- did not test  VESTIBULAR ASSESSMENT: (Measures in this section from initial evaluation unless otherwise noted) GENERAL OBSERVATION: The patient walks into clinic slowed pace   SYMPTOM BEHAVIOR:  Subjective history: h/o recurring episodes of dizziness -- recently saw MD and had + L dix hallpike, is taking meclizine 2x/day at this time (took 1 hour ago)  Non-Vestibular symptoms:  ear popping  Type of dizziness: Imbalance (Disequilibrium) and Spinning/Vertigo  Frequency: a couple of times per week  Duration: lasts x seconds or until he moves his head   Aggravating factors:  lying supine with head to the left  Relieving factors: head stationary  Progression of symptoms: worse  OCULOMOTOR EXAM:  Ocular Alignment: normal  Ocular ROM: No Limitations  Spontaneous Nystagmus: absent  Gaze-Induced Nystagmus: absent  Smooth Pursuits: intact  Saccades: intact Progressive lenses donned   VESTIBULAR - OCULAR  REFLEX:   Slow VOR: Normal  VOR Cancellation: Normal  Head-Impulse Test: Not tested today  Dynamic Visual Acuity:not tested today   POSITIONAL TESTING: Right Dix-Hallpike: no nystagmus and gets a sensation of lightheadedness with return to sitting. Left Dix-Hallpike: upbeating, left nystagmus *8 seconds, small amplitude nystagmus noted Right Roll Test: no nystagmus Left Roll Test: no nystagmus *Patient is moving slowly for all testing  MOTION SENSITIVITY: Notes dizziness with rolling R<>L while moving that settles within seconds   [facility] Adult PT Treatment:                                                DATE: 03/09/23 Neuromuscular re-ed: Habituation Horizontal rolling 3 reps R and L without dizziness Brandt daroff x 2 reps to each side without dizziness Gaze x 1 viewing Standing horizontal x 30 sec Standing vertical x 30 sec Corner balance standing Foam eyes open + head turns Foam eyes closed with supervision  [facility] Adult PT Treatment:  DATE: 03/02/23 Neuromuscular re-ed: Canalith Repositioning:  Epley Left: Number of Reps: 2 Habituation  Rolling R<>L x 3 reps Self Care: Patient is taking meclizine 2 times/day x the past couple of months  *Pt gets mild nausea and is hot/clammy-- measured BP/HR and patient rested before leaving clinic Vitals: HR=58 bpm BP=123/82  PATIENT EDUCATION: Education details: Ashby Dawes of condition,  HEP Person educated: Patient Education method: Explanation, Demonstration, and Handouts Education comprehension: verbalized understanding and returned demonstration  HOME EXERCISE PROGRAM: Access Code: [email or web address] URL: [email or web address] Date: 03/09/2023 Prepared by: Margretta Ditty  Exercises - Rolling rightleft sides for vestibular habituation  - 1 x daily - 7 x weekly - 1 sets - 3 reps - Brandt-Daroff Vestibular Exercise  - 1 x daily - 7 x weekly - 1 sets - 3 reps - Corner Balance Feet  Apart: Eyes Open With Head Turns  - 1 x daily - 7 x weekly - 1 sets - 5-10 reps  GOALS: Goals reviewed with patient? Yes  SHORT TERM GOALS: Target date: 04/01/23  The patient will be indep with HEP. Baseline: initiated at eval Goal status: INITIAL  2.  The patient will report no dizziness with rolling R<>L in bed. Baseline:  Notes dizziness and nausea when rolling in clinic. Goal status: INITIAL  3.  The patient will have negative L dix hallpike testing. Baseline:  Small amplitude nystagmus viewed in room light. Goal status: INITIAL  LONG TERM GOALS: Target date: 05/01/23  The patient will be indep with HEP progression. Baseline:  Established at eval Goal status: INITIAL  2.  The patient will be further assessed for multi-sensory balance conditions. Baseline:  Has had 2 falls in past 6 months. Goal status: INITIAL  3.  The patient will be able to return to gym routine. Baseline:  Has not been able to go to gym due to vertigo symptoms. Goal status: INITIAL  ASSESSMENT:  CLINICAL IMPRESSION: The patient tolerated rolling for habituation well. PT progressed HEP and encouraged continue return to gym routine. Plan to continue to progress to STGs/LTGs.   EVAL:Patient is a 72 y.o. male who was seen today for physical therapy evaluation and treatment for vertigo. He presents with multiple impairments including + L dix hallpike (small amplitude-- took meclizine prior to eval, which may impact response), imbalance, slowed gait speed, and motion sensitivity with rolling. PT initiated treatment including habituation and canolith repositioning. Patient needed to rest after due to nausea. Vitals are WNLs. PT to address deficits and work to STGs/LTGs to improve functional mobility.    OBJECTIVE IMPAIRMENTS: decreased activity tolerance, decreased balance, and dizziness.    PLAN:  PT FREQUENCY: 1-2x/week  PT DURATION: 8 weeks  PLANNED INTERVENTIONS: Therapeutic exercises, Therapeutic  activity, Neuromuscular re-education, Balance training, Gait training, Patient/Family education, Self Care, Joint mobilization, Vestibular training, and Canalith repositioning  PLAN FOR NEXT SESSION: Check HEP-- habituation, add multi-sensory balance, progress motion sensitivity and retest BPPV.   Shireen Rayburn, PT 03/09/2023, 1:58 PM

## 2023-03-09 ENCOUNTER — Encounter: Payer: Self-pay | Admitting: Rehabilitative and Restorative Service Providers"

## 2023-03-09 ENCOUNTER — Ambulatory Visit: Payer: Medicare Other | Admitting: Rehabilitative and Restorative Service Providers"

## 2023-03-09 DIAGNOSIS — R42 Dizziness and giddiness: Secondary | ICD-10-CM | POA: Diagnosis not present

## 2023-03-09 DIAGNOSIS — H8112 Benign paroxysmal vertigo, left ear: Secondary | ICD-10-CM | POA: Diagnosis not present

## 2023-03-09 DIAGNOSIS — Z9181 History of falling: Secondary | ICD-10-CM | POA: Diagnosis not present

## 2023-03-16 ENCOUNTER — Ambulatory Visit: Payer: Medicare Other | Admitting: Rehabilitative and Restorative Service Providers"

## 2023-03-23 ENCOUNTER — Ambulatory Visit: Payer: Medicare Other | Admitting: Rehabilitative and Restorative Service Providers"

## 2023-03-23 ENCOUNTER — Encounter: Payer: Self-pay | Admitting: Rehabilitative and Restorative Service Providers"

## 2023-03-23 DIAGNOSIS — R42 Dizziness and giddiness: Secondary | ICD-10-CM | POA: Diagnosis not present

## 2023-03-23 DIAGNOSIS — H8112 Benign paroxysmal vertigo, left ear: Secondary | ICD-10-CM

## 2023-03-23 DIAGNOSIS — Z9181 History of falling: Secondary | ICD-10-CM | POA: Diagnosis not present

## 2023-03-23 NOTE — Therapy (Addendum)
OUTPATIENT PHYSICAL THERAPY VESTIBULAR TREATMENT AND DISCHARGE   Patient Name: Joshua Warner MRN: [id number] DOB:[birth year], 72 y.o., male Today's Date: 03/23/2023   PHYSICAL THERAPY DISCHARGE SUMMARY  Visits from Start of Care: 3  Current functional level related to goals / functional outcomes: 3   Remaining deficits: No further deficits at last visit/ held chart in case patient needed further therapy   Education / Equipment: HEP   Patient agrees to discharge. Patient goals were met. Patient is being discharged due to meeting the stated rehab goals.  END OF SESSION:  PT End of Session - 03/23/23 1147     Visit Number 3    Number of Visits 12    Date for PT Re-Evaluation 05/01/23    Authorization Type [employer] medicare    Progress Note Due on Visit 10    PT Start Time 1147    PT Stop Time 1227    PT Time Calculation (min) 40 min    Activity Tolerance Patient tolerated treatment well    Behavior During Therapy WFL for tasks assessed/performed             Past Medical History:  Diagnosis Date   Arthritis    CHF (congestive heart failure) (HCC)    stents   Coronary artery disease    stents   GERD (gastroesophageal reflux disease)    Hyperlipidemia    Hypertension since 2007   Kidney stone 2022   Kidney stones 2009   Myocardial infarction (HCC) 12/2015   Oxygen deficiency    only during heartattack   Past Surgical History:  Procedure Laterality Date   CORONARY ANGIOPLASTY WITH STENT PLACEMENT  12/2015   KNEE ARTHROSCOPY  12/31/2010   Right knee    TONSILLECTOMY  1957   Patient Active Problem List   Diagnosis Date Noted   BPPV (benign paroxysmal positional vertigo), left 02/14/2023   Nondisplaced right eighth rib fracture 06/30/2022   Left wrist injury 06/30/2022   Greater trochanteric bursitis, left 01/29/2022   Actinic keratosis 03/21/2021   Kidney stone 03/21/2021   Family history of factor V deficiency 09/15/2020   Purpura senilis (HCC)  03/17/2020   Proteins serum plasma low 03/17/2020   Venous stasis 01/28/2020   Schatzki's ring 09/10/2019   Osteoarthritis of first metatarsophalangeal (MTP) joint of right foot 05/24/2017   Primary osteoarthritis of left hand 12/10/2016   Coronary artery disease involving native coronary artery of native heart without angina pectoris 01/03/2016   Status post insertion of drug eluting coronary artery stent 01/03/2016   STEMI (ST elevation myocardial infarction) (HCC) 01/03/2016   Complete heart block (HCC) 01/03/2016   Syncope 01/03/2016   Subscapularis tendinitis of left shoulder 10/25/2014   Hyperlipidemia 08/12/2014   Extensor intersection syndrome of both wrists 08/02/2013   Cough 08/02/2013   Allergic reaction 02/08/2013   Baker's cyst, left 01/11/2013   Solitary pulmonary nodule 11/14/2012   Arthritis of carpometacarpal joint 10/17/2012   HYPOGONADISM 06/04/2009   HYPERTENSION, BENIGN 06/04/2009   GERD 06/04/2009   ERECTILE DYSFUNCTION, ORGANIC 06/04/2009    PCP: Nani Gasser, MD REFERRING PROVIDER: Anda Kraft, MD REFERRING DIAG: [phone] (ICD-10-CM) - BPPV (benign paroxysmal positional vertigo), left H81.12 (ICD-10-CM) - Benign paroxysmal positional vertigo of left ear  THERAPY DIAG:  Dizziness and giddiness  BPPV (benign paroxysmal positional vertigo), left  ONSET DATE: 02/23/2023  Rationale for Evaluation and Treatment: Rehabilitation  SUBJECTIVE:   SUBJECTIVE STATEMENT:  The patient reports he is doing neck AROM in the morning upon waking. He  has not had any bad episodes of dizziness. When he returns to sitting, it takes time for things to settle back to baseline.    EVAL:The patient reports he had a fall 3 weeks ago when trying to work on the dryer vent-- he got a little dizzy and had to walk backwards and ended up falling. Symptoms of dizziness are worse since falling "I think I stirred things up again." He notes that he has been tested with L dix  hallpike and had symptoms reproduced with Dr. Linford Arnold. He has seen neurology and is getting a workup for vertebrobasilar insufficiency and MRI. He also sees a Land and got a spinning episode last week when lying down. Pt accompanied by: self  PERTINENT HISTORY: vertigo, hearing aids  PAIN: Are you having pain? No  PRECAUTIONS: Fall  WEIGHT BEARING RESTRICTIONS: No  FALLS: Has patient fallen in last 6 months? Yes. Number of falls 2, (1 April 4 at the gym and the recent one 3 weeks ago)   also had 1 before Christmas  PATIENT GOALS: reduce dizziness, improve balance   OBJECTIVE:  (Measures in this section from initial evaluation unless otherwise noted) DIAGNOSTIC FINDINGS: Has schedule testing for MRI, MRA in October.  IMPRESSION: Mild plaque at the level of the right carotid bulb and proximal right ICA and minimal plaque at the level of the proximal left ICA. No significant carotid stenosis identified. Estimated bilateral ICA stenoses are less than 50%.  SENSATION: Numbness does not occur with dizziness, but notes an episode of L hand not being able to lift while at work  Cervical ROM:  Not tested  GAIT: Gait pattern:  slowed speed, wider base of support Distance walked: 100 ft Assistive device utilized: None Level of assistance: Complete Independence  PATIENT SURVEYS:  FOTO --- did not test  VESTIBULAR ASSESSMENT: (Measures in this section from initial evaluation unless otherwise noted) GENERAL OBSERVATION: The patient walks into clinic slowed pace   SYMPTOM BEHAVIOR:  Subjective history: h/o recurring episodes of dizziness -- recently saw MD and had + L dix hallpike, is taking meclizine 2x/day at this time (took 1 hour ago)  Non-Vestibular symptoms:  ear popping  Type of dizziness: Imbalance (Disequilibrium) and Spinning/Vertigo  Frequency: a couple of times per week  Duration: lasts x seconds or until he moves his head   Aggravating factors:  lying supine  with head to the left  Relieving factors: head stationary  Progression of symptoms: worse  OCULOMOTOR EXAM:  Ocular Alignment: normal  Ocular ROM: No Limitations  Spontaneous Nystagmus: absent  Gaze-Induced Nystagmus: absent  Smooth Pursuits: intact  Saccades: intact Progressive lenses donned   VESTIBULAR - OCULAR REFLEX:   Slow VOR: Normal  VOR Cancellation: Normal  Head-Impulse Test: Not tested today  Dynamic Visual Acuity:not tested today   POSITIONAL TESTING: Right Dix-Hallpike: no nystagmus and gets a sensation of lightheadedness with return to sitting. Left Dix-Hallpike: upbeating, left nystagmus *8 seconds, small amplitude nystagmus noted Right Roll Test: no nystagmus Left Roll Test: no nystagmus *Patient is moving slowly for all testing  MOTION SENSITIVITY: Notes dizziness with rolling R<>L while moving that settles within seconds   [facility] Adult PT Treatment:                                                DATE: 03/23/23 Neuromuscular re-ed: Habituation Rolling R  and L x 3 reps  Brandt daroff x 2 reps Balance standing in the corner with eyes closed on solid surfaces Corner standing with feet apart + head turns Single leg standing Self: Discussed gym routine and recommended return to cardio and strength training to maintain fitness/wellness routine Discussed continuation of HEP after discharge   [facility] Adult PT Treatment:                                                DATE: 03/09/23 Neuromuscular re-ed: Habituation Horizontal rolling 3 reps R and L without dizziness Brandt daroff x 2 reps to each side without dizziness Gaze x 1 viewing Standing horizontal x 30 sec Standing vertical x 30 sec Corner balance standing Foam eyes open + head turns Foam eyes closed with supervision  [facility] Adult PT Treatment:                                                DATE: 03/02/23 Neuromuscular re-ed: Canalith Repositioning:  Epley Left: Number of Reps: 2 Habituation  Rolling  R<>L x 3 reps Self Care: Patient is taking meclizine 2 times/day x the past couple of months  *Pt gets mild nausea and is hot/clammy-- measured BP/HR and patient rested before leaving clinic Vitals: HR=58 bpm BP=123/82  PATIENT EDUCATION: Education details: Ashby Dawes of condition,  HEP Person educated: Patient Education method: Explanation, Demonstration, and Handouts Education comprehension: verbalized understanding and returned demonstration  HOME EXERCISE PROGRAM: Access Code: [email or web address] URL: [email or web address] Date: 03/23/2023 Prepared by: Margretta Ditty  Exercises - Rolling rightleft sides for vestibular habituation  - 1 x daily - 7 x weekly - 1 sets - 3 reps - Brandt-Daroff Vestibular Exercise  - 1 x daily - 7 x weekly - 1 sets - 3 reps - Corner Balance Feet Apart: Eyes Open With Head Turns  - 1 x daily - 7 x weekly - 1 sets - 5-10 reps - Corner Balance Feet Together With Eyes Closed  - 1 x daily - 7 x weekly - 1 sets - 3 reps - 30 seconds hold - Single Leg Stance with Support  - 1 x daily - 7 x weekly - 1 sets - 3 reps - 10 seconds hold   GOALS: Goals reviewed with patient? Yes  SHORT TERM GOALS: Target date: 04/01/23  The patient will be indep with HEP. Baseline: initiated at eval Goal status:MET  2.  The patient will report no dizziness with rolling R<>L in bed. Baseline:  Notes dizziness and nausea when rolling in clinic. Goal status: MET  3.  The patient will have negative L dix hallpike testing. Baseline:  Small amplitude nystagmus viewed in room light. Goal status:MET--checked via L sidelying test  LONG TERM GOALS: Target date: 05/01/23  The patient will be indep with HEP progression. Baseline:  Established at eval Goal status: MET  2.  The patient will be further assessed for multi-sensory balance conditions. Baseline:  Has had 2 falls in past 6 months. Goal status: MET  3.  The patient will be able to return to gym routine. Baseline:   Has not been able to go to gym due to vertigo symptoms. Goal status: MET  ASSESSMENT:  CLINICAL IMPRESSION: The patient notes  improvement and has met all STGs and LTGs-- we will hold chart open due to him wanting to ween from meclizine. He will call if symptoms worsen when not taking meds. PT to d/c in one month if no return.  EVAL:Patient is a 72 y.o. male who was seen today for physical therapy evaluation and treatment for vertigo. He presents with multiple impairments including + L dix hallpike (small amplitude-- took meclizine prior to eval, which may impact response), imbalance, slowed gait speed, and motion sensitivity with rolling. PT initiated treatment including habituation and canolith repositioning. Patient needed to rest after due to nausea. Vitals are WNLs. PT to address deficits and work to STGs/LTGs to improve functional mobility.    OBJECTIVE IMPAIRMENTS: decreased activity tolerance, decreased balance, and dizziness.    PLAN:  PT FREQUENCY: 1-2x/week  PT DURATION: 8 weeks  PLANNED INTERVENTIONS: Therapeutic exercises, Therapeutic activity, Neuromuscular re-education, Balance training, Gait training, Patient/Family education, Self Care, Joint mobilization, Vestibular training, and Canalith repositioning  PLAN FOR NEXT SESSION: HOLD THERAPY  patient will call if needed in the next 30 days.    Antania Hoefling, PT 03/23/2023, 11:47 AM

## 2023-04-06 ENCOUNTER — Ambulatory Visit (INDEPENDENT_AMBULATORY_CARE_PROVIDER_SITE_OTHER): Payer: Medicare Other | Admitting: Family Medicine

## 2023-04-06 VITALS — Wt 219.0 lb

## 2023-04-06 DIAGNOSIS — Z0000 Encounter for general adult medical examination without abnormal findings: Secondary | ICD-10-CM | POA: Diagnosis not present

## 2023-04-06 NOTE — Progress Notes (Signed)
MEDICARE ANNUAL WELLNESS VISIT  04/06/2023  Telephone Visit Disclaimer This Medicare AWV was conducted by telephone due to national recommendations for restrictions regarding the COVID-19 Pandemic (e.g. social distancing).  I verified, using two identifiers, that I am speaking with Joshua Warner or their authorized healthcare agent. I discussed the limitations, risks, security, and privacy concerns of performing an evaluation and management service by telephone and the potential availability of an in-person appointment in the future. The patient expressed understanding and agreed to proceed.  Location of Patient: Home Location of Provider (nurse):  In the office.  Subjective:    Joshua Warner is a 72 y.o. male patient of Metheney, Barbarann Ehlers, MD who had a Medicare Annual Wellness Visit today via telephone. Joshua Warner is Retired and lives with their spouse and his daughter. he has 2 children. he reports that he is socially active and does interact with friends/family regularly. he is moderately physically active and enjoys reading, watching television and going to the gym.  Patient Care Team: Agapito Games, MD as PCP - General Julaine Hua, MD as Referring Physician (Cardiology)     04/06/2023    8:09 AM 03/02/2023    3:14 PM 10/12/2022    4:18 PM 04/02/2022    8:05 AM 03/25/2021    9:09 AM 07/31/2019   10:09 AM 11/18/2016    5:45 PM  Advanced Directives  Does Patient Have a Medical Advance Directive? No Yes Yes No No No Yes  Does patient want to make changes to medical advance directive?       No - Patient declined  Would patient like information on creating a medical advance directive? No - Patient declined   No - Patient declined No - Patient declined No - Patient declined     Hospital Utilization Over the Past 12 Months: # of hospitalizations or ER visits: 1 # of surgeries: 0  Review of Systems    Patient reports that his overall health is unchanged compared to  last year.  History obtained from chart review and the patient  Patient Reported Readings (BP, Pulse, CBG, Weight, etc) Weight: 219 lb Per patient no change in vitals since last visit other than weight, unable to obtain new vitals due to telehealth visit  Pain Assessment Pain : No/denies pain     Current Medications & Allergies (verified) Allergies as of 04/06/2023       Reactions   Celebrex [celecoxib]    REACTION: rash REACTION: rash   Lisinopril Other (See Comments)   Cough   Sulfa Antibiotics    Other reaction(s): Unknown   Sulfonamide Derivatives    REACTION: hives   Tamsulosin Itching, Rash        Medication List        Accurate as of April 06, 2023  8:15 AM. If you have any questions, ask your nurse or doctor.          ascorbic acid 500 MG tablet Commonly known as: VITAMIN C Take 1,000 mg by mouth daily.   ASPIRIN ADULT PO Take 81 mg by mouth.   atorvastatin 80 MG tablet Commonly known as: LIPITOR Take 80 mg by mouth at bedtime.   b complex vitamins tablet Take 1 tablet by mouth daily.   Cholecalciferol 125 MCG (5000 UT) capsule Take 5,000 Units by mouth daily.   Glucosamine Chondr 1500 Complx Caps Take by mouth daily.   loratadine 10 MG tablet Commonly known as: CLARITIN Take 10 mg by mouth daily.  losartan 100 MG tablet Commonly known as: COZAAR TAKE 1 TABLET BY MOUTH EVERY DAY   meclizine 25 MG tablet Commonly known as: ANTIVERT Take 25 mg by mouth every 6 (six) hours as needed for dizziness.   metoprolol tartrate 25 MG tablet Commonly known as: LOPRESSOR Take 12.5 mg by mouth 2 (two) times daily.   multivitamin tablet Take 1 tablet by mouth daily.   nitroGLYCERIN 0.4 MG SL tablet Commonly known as: NITROSTAT Place 1 tablet (0.4 mg total) under the tongue every 5 (five) minutes as needed.   ondansetron 4 MG disintegrating tablet Commonly known as: ZOFRAN-ODT Take 4 mg by mouth every 6 (six) hours as needed for nausea or  vomiting.   pantoprazole 40 MG tablet Commonly known as: PROTONIX TAKE ONE TABLET BY MOUTH DAILY.   terbinafine 1 % cream Commonly known as: Athletes Foot (Terbinafine) Apply 1 Application topically daily. To Groin area x 3-4 weeks.        History (reviewed): Past Medical History:  Diagnosis Date   Arthritis    CHF (congestive heart failure) (HCC)    stents   Coronary artery disease    stents   GERD (gastroesophageal reflux disease)    Hyperlipidemia    Hypertension since 2007   Kidney stone 2022   Kidney stones 2009   Myocardial infarction (HCC) 12/2015   Oxygen deficiency    only during heartattack   Past Surgical History:  Procedure Laterality Date   CORONARY ANGIOPLASTY WITH STENT PLACEMENT  12/2015   KNEE ARTHROSCOPY  12/31/2010   Right knee    TONSILLECTOMY  1957   Family History  Problem Relation Age of Onset   Heart disease Father    Hypertension Mother    Arthritis Mother    Prostate cancer Maternal Uncle        Deceased   Depression Maternal Uncle    Cancer Paternal Grandfather    Cancer Paternal Grandmother    Colon cancer Neg Hx    Stomach cancer Neg Hx    Colon polyps Neg Hx    Esophageal cancer Neg Hx    Social History   Socioeconomic History   Marital status: Married    Spouse name: Peggy   Number of children: 2   Years of education: 15   Highest education level: Some college, no degree  Occupational History   Occupation: Retired    Associate Professor: [employer]  Tobacco Use   Smoking status: Never   Smokeless tobacco: Never  Vaping Use   Vaping status: Never Used  Substance and Sexual Activity   Alcohol use: Yes    Alcohol/week: 1.0 standard drink of alcohol    Types: 1 Glasses of wine per week    Comment: Less than one drink per month   Drug use: Never   Sexual activity: Not Currently    Partners: Female    Birth control/protection: None    Comment: route sales for [employer], HS degree, 5 yrs college, married, 2 adult  kids, doesn't  exercise regularly, 2-3 caffeinated drinks per day.  Other Topics Concern   Not on file  Social History Narrative   Lives with his wife and their daughter. He enjoys reading, watching t.v. and going to the gym.   Social Determinants of Health   Financial Resource Strain: Low Risk  (04/02/2023)   Overall Financial Resource Strain (CARDIA)    Difficulty of Paying Living Expenses: Not hard at all  Food Insecurity: No Food Insecurity (04/02/2023)   Hunger  Vital Sign    Worried About Programme researcher, broadcasting/film/video in the Last Year: Never true    Ran Out of Food in the Last Year: Never true  Transportation Needs: No Transportation Needs (04/02/2023)   PRAPARE - Administrator, Civil Service (Medical): No    Lack of Transportation (Non-Medical): No  Physical Activity: Sufficiently Active (04/02/2023)   Exercise Vital Sign    Days of Exercise per Week: 3 days    Minutes of Exercise per Session: 60 min  Stress: No Stress Concern Present (04/02/2023)   [employer] of Occupational Health - Occupational Stress Questionnaire    Feeling of Stress : Not at all  Social Connections: Moderately Integrated (04/06/2023)   Social Connection and Isolation Panel [NHANES]    Frequency of Communication with Friends and Family: Never    Frequency of Social Gatherings with Friends and Family: Once a week    Attends Religious Services: More than 4 times per year    Active Member of [employer] or Organizations: Yes    Attends Banker Meetings: 1 to 4 times per year    Marital Status: Married    Activities of Daily Living    04/06/2023    8:09 AM 04/02/2023    9:54 AM  In your present state of health, do you have any difficulty performing the following activities:  Hearing? -- 0  Comment bilateral hearing aids   Vision?  0  Difficulty concentrating or making decisions?  0  Walking or climbing stairs?  0  Dressing or bathing?  0  Doing errands, shopping?  0  Preparing Food and eating ?  N   Using the Toilet?  N  In the past six months, have you accidently leaked urine?  N  Do you have problems with loss of bowel control?  N  Managing your Medications?  N  Managing your Finances?  N  Housekeeping or managing your Housekeeping?  N    Patient Education/ Literacy How often do you need to have someone help you when you read instructions, pamphlets, or other written materials from your doctor or pharmacy?: 1 - Never What is the last grade level you completed in school?: several years of college  Exercise    Diet Patient reports consuming 3 meals a day and 2 snack(s) a day Patient reports that his primary diet is: Regular Patient reports that she does have regular access to food.   Depression Screen    04/06/2023    8:10 AM 10/07/2022   11:54 AM 04/29/2022    3:06 PM 04/02/2022    8:06 AM 10/27/2021    2:02 PM 03/25/2021    9:09 AM 09/15/2020    2:29 PM  PHQ 2/9 Scores  PHQ - 2 Score 0 0 0 0 0 0 0     Fall Risk    04/06/2023    8:06 AM 04/02/2023    9:54 AM 02/14/2023    3:06 PM 10/07/2022   11:54 AM 04/29/2022    3:05 PM  Fall Risk   Falls in the past year? 1 1 1  0 0  Number falls in past yr: 1 1 1  0 1  Injury with Fall? 1 1 1  0 0  Risk for fall due to : History of fall(s)  History of fall(s) No Fall Risks No Fall Risks  Follow up Falls evaluation completed;Education provided;Falls prevention discussed  Falls evaluation completed Falls evaluation completed Falls evaluation completed  Objective:  Joshua Warner seemed alert and oriented and he participated appropriately during our telephone visit.  Blood Pressure Weight BMI  BP Readings from Last 3 Encounters:  02/14/23 116/72  11/08/22 115/68  04/29/22 119/80   Wt Readings from Last 3 Encounters:  04/06/23 219 lb (99.3 kg)  02/23/23 225 lb 6.4 oz (102.2 kg)  02/14/23 221 lb (100.2 kg)   BMI Readings from Last 1 Encounters:  04/06/23 31.42 kg/m    *Unable to obtain current vital signs, weight, and  BMI due to telephone visit type  Hearing/Vision  Joshua Warner did not seem to have difficulty with hearing/understanding during the telephone conversation Reports that he has had a formal eye exam by an eye care professional within the past year Reports that he has had a formal hearing evaluation within the past year *Unable to fully assess hearing and vision during telephone visit type  Cognitive Function:    04/06/2023    8:11 AM 04/02/2022    8:09 AM 03/25/2021    9:17 AM 07/31/2019   10:13 AM  6CIT Screen  What Year? 0 points 0 points 0 points 0 points  What month? 0 points 0 points 0 points 0 points  What time? 0 points 0 points 0 points 0 points  Count back from 20 0 points 0 points 0 points 0 points  Months in reverse 0 points 0 points 0 points 0 points  Repeat phrase 0 points 0 points 0 points 0 points  Total Score 0 points 0 points 0 points 0 points   (Normal:0-7, Significant for Dysfunction: >8)  Normal Cognitive Function Screening: Yes   Immunization & Health Maintenance Record Immunization History  Administered Date(s) Administered   Fluad Quad(high Dose 65+) 03/13/2019, 03/17/2020, 03/20/2021   Influenza Split 03/28/2012, 03/28/2012   Influenza Whole 04/28/2009, 03/28/2010   Influenza, High Dose Seasonal PF 03/04/2017   Influenza, Seasonal, Injecte, Preservative Fre 03/22/2014, 03/28/2015, 02/20/2016   Influenza,inj,Quad PF,6+ Mos 03/22/2014, 03/28/2015, 02/20/2016, 03/13/2018   Influenza-Unspecified 04/19/2013, 03/16/2022, 02/14/2023   Moderna SARS-COV2 Booster Vaccination 05/01/2020, 01/01/2021   Moderna Sars-Covid-2 Vaccination 07/30/2019, 08/27/2019   Pneumococcal Conjugate-13 03/04/2017   Pneumococcal Polysaccharide-23 09/11/2018   Td 04/28/2008   Tdap 09/13/2018   Zoster Recombinant(Shingrix) 09/14/2021, 12/09/2021   Zoster, Live 12/14/2013    Health Maintenance  Topic Date Due   COVID-19 Vaccine (3 - Moderna risk series) 02/26/2024 (Originally 01/29/2021)    Medicare Annual Wellness (AWV)  04/05/2024   DTaP/Tdap/Td (3 - Td or Tdap) 09/12/2028   Colonoscopy  12/09/2029   Pneumonia Vaccine 65+ Years old  Completed   INFLUENZA VACCINE  Completed   Hepatitis C Screening  Completed   Zoster Vaccines- Shingrix  Completed   HPV VACCINES  Aged Out       Assessment  This is a routine wellness examination for [employer]  Health Maintenance: Due or Overdue There are no preventive care reminders to display for this patient.   Joshua Warner does not need a referral for Community Assistance: Care Management:   no Social Work:    no Prescription Assistance:  no Nutrition/Diabetes Education:  no   Plan:  Personalized Goals  Goals Addressed               This Visit's Progress     Patient Stated (pt-stated)        Patient stated that he would like to loose some more weight.       Personalized Health Maintenance & Screening Recommendations  There are  no preventive care reminders to display for this patient.  Lung Cancer Screening Recommended: no (Low Dose CT Chest recommended if Age 45-80 years, 20 pack-year currently smoking OR have quit w/in past 15 years) Hepatitis C Screening recommended: no HIV Screening recommended: no  Advanced Directives: Written information was not prepared per patient's request.  Referrals & Orders No orders of the defined types were placed in this encounter.   Follow-up Plan Follow-up with Agapito Games, MD as planned Medicare wellness visit in one year.  Patient will access AVS on my chart.   I have personally reviewed and noted the following in the patient's chart:   Medical and social history Use of alcohol, tobacco or illicit drugs  Current medications and supplements Functional ability and status Nutritional status Physical activity Advanced directives List of other physicians Hospitalizations, surgeries, and ER visits in previous 12 months Vitals Screenings to include  cognitive, depression, and falls Referrals and appointments  In addition, I have reviewed and discussed with Joshua Warner certain preventive protocols, quality metrics, and best practice recommendations. A written personalized care plan for preventive services as well as general preventive health recommendations is available and can be mailed to the patient at his request.      Joshua Charon, RN BSN  04/06/2023

## 2023-04-06 NOTE — Patient Instructions (Addendum)
MEDICARE ANNUAL WELLNESS VISIT Health Maintenance Summary and Written Plan of Care  [name] ,  Thank you for allowing me to perform your Medicare Annual Wellness Visit and for your ongoing commitment to your health.   Health Maintenance & Immunization History Health Maintenance  Topic Date Due   COVID-19 Vaccine (3 - Moderna risk series) 02/26/2024 (Originally 01/29/2021)   Medicare Annual Wellness (AWV)  04/05/2024   DTaP/Tdap/Td (3 - Td or Tdap) 09/12/2028   Colonoscopy  12/09/2029   Pneumonia Vaccine 35+ Years old  Completed   INFLUENZA VACCINE  Completed   Hepatitis C Screening  Completed   Zoster Vaccines- Shingrix  Completed   HPV VACCINES  Aged Out   Immunization History  Administered Date(s) Administered   Fluad Quad(high Dose 65+) 03/13/2019, 03/17/2020, 03/20/2021   Influenza Split 03/28/2012, 03/28/2012   Influenza Whole 04/28/2009, 03/28/2010   Influenza, High Dose Seasonal PF 03/04/2017   Influenza, Seasonal, Injecte, Preservative Fre 03/22/2014, 03/28/2015, 02/20/2016   Influenza,inj,Quad PF,6+ Mos 03/22/2014, 03/28/2015, 02/20/2016, 03/13/2018   Influenza-Unspecified 04/19/2013, 03/16/2022, 02/14/2023   Moderna SARS-COV2 Booster Vaccination 05/01/2020, 01/01/2021   Moderna Sars-Covid-2 Vaccination 07/30/2019, 08/27/2019   Pneumococcal Conjugate-13 03/04/2017   Pneumococcal Polysaccharide-23 09/11/2018   Td 04/28/2008   Tdap 09/13/2018   Zoster Recombinant(Shingrix) 09/14/2021, 12/09/2021   Zoster, Live 12/14/2013    These are the patient goals that we discussed:  Goals Addressed               This Visit's Progress     Patient Stated (pt-stated)        Patient stated that he would like to loose some more weight.         This is a list of Health Maintenance Items that are overdue or due now: There are no preventive care reminders to display for this patient.   Orders/Referrals Placed Today: No orders of the defined types were placed in this  encounter.  (Contact our referral department at [phone] if you have not spoken with someone about your referral appointment within the next 5 days)    Follow-up Plan Follow-up with Agapito Games, MD as planned Medicare wellness visit in one year.  Patient will access AVS on my chart.      Health Maintenance, Male Adopting a healthy lifestyle and getting preventive care are important in promoting health and wellness. Ask your health care provider about: The right schedule for you to have regular tests and exams. Things you can do on your own to prevent diseases and keep yourself healthy. What should I know about diet, weight, and exercise? Eat a healthy diet  Eat a diet that includes plenty of vegetables, fruits, low-fat dairy products, and lean protein. Do not eat a lot of foods that are high in solid fats, added sugars, or sodium. Maintain a healthy weight Body mass index (BMI) is a measurement that can be used to identify possible weight problems. It estimates body fat based on height and weight. Your health care provider can help determine your BMI and help you achieve or maintain a healthy weight. Get regular exercise Get regular exercise. This is one of the most important things you can do for your health. Most adults should: Exercise for at least 150 minutes each week. The exercise should increase your heart rate and make you sweat (moderate-intensity exercise). Do strengthening exercises at least twice a week. This is in addition to the moderate-intensity exercise. Spend less time sitting. Even light physical activity can be beneficial.  Watch cholesterol and blood lipids Have your blood tested for lipids and cholesterol at 71 years of age, then have this test every 5 years. You may need to have your cholesterol levels checked more often if: Your lipid or cholesterol levels are high. You are older than 71 years of age. You are at high risk for heart disease. What  should I know about cancer screening? Many types of cancers can be detected early and may often be prevented. Depending on your health history and family history, you may need to have cancer screening at various ages. This may include screening for: Colorectal cancer. Prostate cancer. Skin cancer. Lung cancer. What should I know about heart disease, diabetes, and high blood pressure? Blood pressure and heart disease High blood pressure causes heart disease and increases the risk of stroke. This is more likely to develop in people who have high blood pressure readings or are overweight. Talk with your health care provider about your target blood pressure readings. Have your blood pressure checked: Every 3-5 years if you are 6-80 years of age. Every year if you are 89 years old or older. If you are between the ages of 81 and 45 and are a current or former smoker, ask your health care provider if you should have a one-time screening for abdominal aortic aneurysm (AAA). Diabetes Have regular diabetes screenings. This checks your fasting blood sugar level. Have the screening done: Once every three years after age 85 if you are at a normal weight and have a low risk for diabetes. More often and at a younger age if you are overweight or have a high risk for diabetes. What should I know about preventing infection? Hepatitis B If you have a higher risk for hepatitis B, you should be screened for this virus. Talk with your health care provider to find out if you are at risk for hepatitis B infection. Hepatitis C Blood testing is recommended for: Everyone born from [birth year] through 1964. Anyone with known risk factors for hepatitis C. Sexually transmitted infections (STIs) You should be screened each year for STIs, including gonorrhea and chlamydia, if: You are sexually active and are younger than 71 years of age. You are older than 71 years of age and your health care provider tells you that you are  at risk for this type of infection. Your sexual activity has changed since you were last screened, and you are at increased risk for chlamydia or gonorrhea. Ask your health care provider if you are at risk. Ask your health care provider about whether you are at high risk for HIV. Your health care provider may recommend a prescription medicine to help prevent HIV infection. If you choose to take medicine to prevent HIV, you should first get tested for HIV. You should then be tested every 3 months for as long as you are taking the medicine. Follow these instructions at home: Alcohol use Do not drink alcohol if your health care provider tells you not to drink. If you drink alcohol: Limit how much you have to 0-2 drinks a day. Know how much alcohol is in your drink. In the [place] one drink equals one 12 oz bottle of beer (355 mL), one 5 oz glass of wine (148 mL), or one 1 oz glass of hard liquor (44 mL). Lifestyle Do not use any products that contain nicotine or tobacco. These products include cigarettes, chewing tobacco, and vaping devices, such as e-cigarettes. If you need help quitting, ask your health  care provider. Do not use street drugs. Do not share needles. Ask your health care provider for help if you need support or information about quitting drugs. General instructions Schedule regular health, dental, and eye exams. Stay current with your vaccines. Tell your health care provider if: You often feel depressed. You have ever been abused or do not feel safe at home. Summary Adopting a healthy lifestyle and getting preventive care are important in promoting health and wellness. Follow your health care provider's instructions about healthy diet, exercising, and getting tested or screened for diseases. Follow your health care provider's instructions on monitoring your cholesterol and blood pressure. This information is not intended to replace advice given to you by your health care provider.  Make sure you discuss any questions you have with your health care provider. Document Revised: 11/03/2020 Document Reviewed: 11/03/2020 Elsevier Patient Education  2024 [employer].

## 2023-04-07 ENCOUNTER — Ambulatory Visit
Admission: RE | Admit: 2023-04-07 | Discharge: 2023-04-07 | Disposition: A | Discharge status: Home / Self Care | Payer: Medicare Other | Source: Ambulatory Visit | Attending: Neurology | Admitting: Neurology

## 2023-04-07 DIAGNOSIS — G450 Vertebro-basilar artery syndrome: Secondary | ICD-10-CM | POA: Diagnosis not present

## 2023-04-07 DIAGNOSIS — G463 Brain stem stroke syndrome: Secondary | ICD-10-CM

## 2023-04-07 DIAGNOSIS — R42 Dizziness and giddiness: Secondary | ICD-10-CM

## 2023-04-07 DIAGNOSIS — I6350 Cerebral infarction due to unspecified occlusion or stenosis of unspecified cerebral artery: Secondary | ICD-10-CM

## 2023-04-07 DIAGNOSIS — R270 Ataxia, unspecified: Secondary | ICD-10-CM

## 2023-04-07 DIAGNOSIS — R55 Syncope and collapse: Secondary | ICD-10-CM

## 2023-04-07 DIAGNOSIS — H539 Unspecified visual disturbance: Secondary | ICD-10-CM

## 2023-04-07 DIAGNOSIS — I679 Cerebrovascular disease, unspecified: Secondary | ICD-10-CM | POA: Diagnosis not present

## 2023-04-07 MED ORDER — GADOPICLENOL 0.5 MMOL/ML IV SOLN
10.0000 mL | Freq: Once | INTRAVENOUS | Status: AC | PRN
  Administered 2023-04-07: 10 mL via INTRAVENOUS

## 2023-04-07 MED ORDER — IOPAMIDOL (ISOVUE-370) INJECTION 76%
200.0000 mL | Freq: Once | INTRAVENOUS | Status: AC | PRN
  Administered 2023-04-07: 75 mL via INTRAVENOUS

## 2023-04-29 DIAGNOSIS — M5451 Vertebrogenic low back pain: Secondary | ICD-10-CM | POA: Diagnosis not present

## 2023-04-29 DIAGNOSIS — M9901 Segmental and somatic dysfunction of cervical region: Secondary | ICD-10-CM | POA: Diagnosis not present

## 2023-04-29 DIAGNOSIS — M542 Cervicalgia: Secondary | ICD-10-CM | POA: Diagnosis not present

## 2023-04-29 DIAGNOSIS — M9903 Segmental and somatic dysfunction of lumbar region: Secondary | ICD-10-CM | POA: Diagnosis not present

## 2023-05-02 ENCOUNTER — Telehealth: Payer: Self-pay | Admitting: *Deleted

## 2023-05-02 NOTE — Telephone Encounter (Signed)
Spoke to pt  gave CT Angio results pt states already saw results on mychart  Pt thanked me for calling

## 2023-05-02 NOTE — Telephone Encounter (Signed)
-----   Message from Anson Fret sent at 05/02/2023  1:32 PM EST ----- Do not send to work in, thanks

## 2023-05-05 ENCOUNTER — Other Ambulatory Visit: Payer: Self-pay | Admitting: Family Medicine

## 2023-05-05 DIAGNOSIS — I10 Essential (primary) hypertension: Secondary | ICD-10-CM

## 2023-05-16 DIAGNOSIS — I10 Essential (primary) hypertension: Secondary | ICD-10-CM | POA: Diagnosis not present

## 2023-05-16 DIAGNOSIS — I2510 Atherosclerotic heart disease of native coronary artery without angina pectoris: Secondary | ICD-10-CM | POA: Diagnosis not present

## 2023-05-16 DIAGNOSIS — E782 Mixed hyperlipidemia: Secondary | ICD-10-CM | POA: Diagnosis not present

## 2023-05-30 DIAGNOSIS — M542 Cervicalgia: Secondary | ICD-10-CM | POA: Diagnosis not present

## 2023-05-30 DIAGNOSIS — M9901 Segmental and somatic dysfunction of cervical region: Secondary | ICD-10-CM | POA: Diagnosis not present

## 2023-05-30 DIAGNOSIS — M5451 Vertebrogenic low back pain: Secondary | ICD-10-CM | POA: Diagnosis not present

## 2023-05-30 DIAGNOSIS — M9903 Segmental and somatic dysfunction of lumbar region: Secondary | ICD-10-CM | POA: Diagnosis not present

## 2023-06-02 ENCOUNTER — Other Ambulatory Visit: Payer: Self-pay | Admitting: Family Medicine

## 2023-06-09 ENCOUNTER — Ambulatory Visit: Payer: Medicare Other | Admitting: Adult Health

## 2023-06-09 ENCOUNTER — Encounter: Payer: Self-pay | Admitting: Adult Health

## 2023-06-09 VITALS — BP 121/75 | HR 62 | Ht 70.0 in | Wt 222.0 lb

## 2023-06-09 DIAGNOSIS — H8112 Benign paroxysmal vertigo, left ear: Secondary | ICD-10-CM

## 2023-06-09 NOTE — Patient Instructions (Addendum)
Your Plan:  Continue meclizine - continue to try discontinuing every couple of weeks   Recommend decreasing salt intake, limiting caffeine intake and avoiding MSG in foods - could consider evaluation by ENT to evaluate for Meniere's Disease   Please call if you would like to do any additional therapy but for now, continue exercises at home     Follow up as needed at this time     Thank you for coming to see [place] at [facility]. I hope we have been able to provide you high quality care today.  You may receive a patient satisfaction survey over the next few weeks. We would appreciate your feedback and comments so that we may continue to improve ourselves and the health of our patients.    Mnire's Disease  Mnire's disease is a condition that affects your inner ear. It often affects just one ear, but it can affect both. It's a lifelong condition that may get worse over time. But there are treatments that can help you manage your symptoms. What are the causes? In your inner ear, there's a fluid called endolymph. If that fluid builds up, it can affect the nerves that help you balance and hear. This can cause Mnire's disease. The reason for the fluid buildup isn't known. But it may be caused by: Allergies. A problem with your body's defense, or immune, system. An infection of your inner ear. A head injury. What increases the risk? You may be more likely to get Mnire's disease if: You're 94-66 years old. Someone else in your family has the condition. You have an autoimmune disease. This kind of disease attacks your immune system. You have a head injury. What are the signs or symptoms? Symptoms may include: A feeling of fullness or pressure in your ear. Tinnitus. This is a ringing or buzzing in your ear. Vertigo. This is when things feel like they're spinning when they're not. Dizziness or loss of balance. Hearing loss. Nausea and vomiting. This is  rare. Symptoms may come and go. They may last for 20 minutes or for many hours at a time. They may get worse over time. How is this diagnosed? Mnire's disease may be diagnosed based on an exam and tests. Tests may include: A hearing test called an audiogram. An electronystagmogram (ENG). This tests the nerve that helps you balance. Imaging studies, such as MRI. Other balance tests. How is this treated? There's no cure. But treatment can help you manage your symptoms. You may need to: Take in less salt. Salt is also called sodium. A low-salt diet can reduce fluid in your body. This can help relieve symptoms. Take medicines. These may include medicines to help with: Vertigo. Nausea. How much fluid your body is holding on to. Use an air pressure pulse generator. This is a machine that sends small pressure pulses into your ear. Get shots through your eardrum. These can help with vertigo. Wear hearing aids. Have inner ear surgery. This is rare. Have therapy to help you manage the stress of living with this condition. Follow these instructions at home: Eating and drinking Limit how much salt you take in as told by your health care provider. You may be told to limit your intake to 1,500-2,000 mg per day. Check foods and drinks to see how much salt is in them. Take in less caffeine. Try to avoid it if you can. Do not drink alcohol. Drink enough fluid to keep your pee (urine) pale yellow. General instructions Take  over-the-counter and prescription medicines only as told by your provider. Find ways to reduce stress. These may include meditation, yoga, or deep breathing. Do not drive if you're dizzy or have vertigo. Do not use any products that contain nicotine or tobacco. These products include cigarettes, chewing tobacco, and vaping devices, such as e-cigarettes. If you need help quitting, ask your provider. Where to find more information American Academy of Otolaryngology-Head and Neck  Surgery Foundation: enthealth.org American Training and development officer (AHRF): american-hearing.org Contact a health care provider if: Your symptoms last longer than 4 hours. You have new or worse symptoms. You've been vomiting for 24 hours. You can't keep fluids down. You can't manage the stress of living with Mnire's disease. Get help right away if: You have chest pain. You have trouble breathing. These symptoms may be an emergency. Get help right away. Call 911. Do not wait to see if the symptoms will go away. Do not drive yourself to the hospital. This information is not intended to replace advice given to you by your health care provider. Make sure you discuss any questions you have with your health care provider. Document Revised: 09/20/2022 Document Reviewed: 09/20/2022 Elsevier Patient Education  2024 [employer].

## 2023-06-09 NOTE — Progress Notes (Signed)
[facility]    Provider:  Dr Lucia Gaskins Requesting Provider: Agapito Games, * Primary Care Provider:  Agapito Games, MD  CC:  vertigo     HPI:    Update 06/09/2023 JM: Patient returns for follow-up visit regarding vertigo complaints.  Completed 3 sessions of PT back in September with improvement of symptoms.  He continues to do his exercises especially every morning upon awakening.  He continues on meclizine daily, he tried to stop last week for 4 days but had worsening of symptoms therefore restarted.  He continues to have occasional visual issue, if he concentrates on an object for too long, the stationary objects in the background look like they are moving, he will close his eyes and shake his head and vision will return to normal.  Continues to have right ear tinnitus, he does have hearing loss R>L, use of hearing aids. Reports previously working at [employer] working in close proximity to [employer] which he believes caused his hearing issues.  He does consume caffeine in the form of coffee every morning and usually early afternoon, the amount can vary from day-to-day.  He does not drink any soda or any other form of caffeine.  He will occasionally add salt to his food, tries to watch sodium content in food but not always mindful of this.  He did complete further workup as recommended with MRI brain and CTA head unremarkable for contributing factors.     Consult visit 02/23/2023 Dr. Lucia Gaskins: Joshua Warner is a 72 y.o. male here as requested by Agapito Games, * for peripheral vertigo, unspecified ear, diagnosed with BPPV on the left in July appointment ordered MR angio of the head, they were able to recreate his vertigo with a Dix-Hallpike to the left,. He has a PMHX of hypertension, coronary artery disease, STEMI, complete heart block, Purpura senilis, hypogonadism, BPPV, extensor intersection syndrome of both wrists, Baker's cyst in  the left, subscapularis tendinitis of the left shoulder, primary osteoarthritis of the left hand and first metacarpal joint of the right foot, greater trochanteric bursitis, erectile dysfunction, hyperlipidemia, syncope, venous stasis, status post drug-eluting coronary artery stent, left wrist injury.  I reviewed Dr. Shelah Lewandowsky notes he was last seen for vertigo.  He was sent here for possible peripheral vertigo, unspecified ear, in July he also saw Dr. Linford Arnold with dizziness, he fell, he stated that he fell backwards and does not feel like he hit his head, his symptoms were worse on 719 with vertigo, he has had vertigo on and off for several months,, when he tilts his head back on the lounge chair and turns to the left and then tries to sit up will have vertigo, going to chiropractor's has helped.  Meclizine does not seem to help.  She performed an MRA of the head due to vertigo with head extension to look for any vascular issues.  He is here alone, first had vertigo over 20 years ago, mild, used meclizine. 5 years later everything started spinning, after he looked up. If he closed his eyes only lasts a few seconds. For years was ok, 5 years ago he felt like he was being pulled but nothing spinning. Then April 4th this year he was at the gym, looking at his phoneand he scrolled and felt movement and he looked up and room spinning. Always happens with extension of the neck to the left. Also has ocular migraine.dizziness, room spinning always with head extension and to the left. Vision  changes. Feels dizzy with next extension and decreased vision. No nausea or vomiting. Has had syncopal episodes with head movements, neck pain. Vision loss. Ocular migraine. No other focal neurologic deficits, associated symptoms, inciting events or modifiable factors.  Dix Hallpike maneuver today with slight symptoms on the left.   Reviewed notes, labs and imaging from outside physicians, which showed :  US carotid:  CLINICAL DATA:  Dizziness, hypertension and history of coronary artery disease.   EXAM: BILATERAL CAROTID DUPLEX ULTRASOUND   TECHNIQUE: Wallace Cullens scale imaging, color Doppler and duplex ultrasound were performed of bilateral carotid and vertebral arteries in the neck.   COMPARISON:  None Available.   FINDINGS: Criteria: Quantification of carotid stenosis is based on velocity parameters that correlate the residual internal carotid diameter with NASCET-based stenosis levels, using the diameter of the distal internal carotid lumen as the denominator for stenosis measurement.   The following velocity measurements were obtained:   RIGHT   ICA:  60/20 cm/sec   CCA:  81/15 cm/sec   SYSTOLIC ICA/CCA RATIO:  0.7   ECA:  49 cm/sec   LEFT   ICA:  52/17 cm/sec   CCA:  69/17 cm/sec   SYSTOLIC ICA/CCA RATIO:  0.8   ECA:  58 cm/sec   RIGHT CAROTID ARTERY: Mild partially calcified plaque at the level of the carotid bulb and proximal right ICA. Estimated right ICA stenosis is less than 50%.   RIGHT VERTEBRAL ARTERY: Antegrade flow with normal waveform and velocity.   LEFT CAROTID ARTERY: Minimal plaque at the origin of the left ICA. Estimated left ICA stenosis is less than 50%.   LEFT VERTEBRAL ARTERY: Antegrade flow with normal waveform and velocity.   IMPRESSION: Mild plaque at the level of the right carotid bulb and proximal right ICA and minimal plaque at the level of the proximal left ICA. No significant carotid stenosis identified. Estimated bilateral ICA stenoses are less than 50%.    CT head 09/2022: NDICATION:Vertigo 780.40  TECHNIQUE:  CT HEAD WO CONTRAST  FINDINGS: CT HEAD Motion artifact. Negative for intracranial hemorrhage. No midline shift or acute mass effect. Ventricles, cisterns, and sulci are unremarkable.   Negative for hydrocephalus.   Mild periventricular and deep white matter hypodensities, statistically sequela of old small vessel ischemic  disease. Visualized orbits are unremarkable. Visualized paranasal sinuses are clear. Mastoid air cells are clear. No displaced or depressed calvarial fracture identified. Procedure Note  Lambert Keto, MD - 09/30/2022 Formatting of this note might be different from the original.   INDICATION:Vertigo 780.40  TECHNIQUE:  CT HEAD WO CONTRAST  FINDINGS: CT HEAD Motion artifact. Negative for intracranial hemorrhage. No midline shift or acute mass effect. Ventricles, cisterns, and sulci are unremarkable.   Negative for hydrocephalus.   Mild periventricular and deep white matter hypodensities, statistically sequela of old small vessel ischemic disease. Visualized orbits are unremarkable. Visualized paranasal sinuses are clear. Mastoid air cells are clear. No displaced or depressed calvarial fracture identified.   IMPRESSION: CT HEAD No acute intracranial abnormality identified.  MRI brain in 05/2020: CLINICAL DATA:  Elevated blood pressure, recent episode of left arm weakness   EXAM: MRI HEAD WITHOUT AND WITH CONTRAST   TECHNIQUE: Multiplanar, multiecho pulse sequences of the brain and surrounding structures were obtained without and with intravenous contrast.   CONTRAST:  10mL GADAVIST GADOBUTROL 1 MMOL/ML IV SOLN   COMPARISON:  None.   FINDINGS: Brain: There is no acute infarction or intracranial hemorrhage. There is no intracranial mass, mass effect, or edema.  There is no hydrocephalus or extra-axial fluid collection. Ventricles and sulci are within normal limits in size and configuration. Patchy small foci of T2 hyperintensity in the supratentorial white matter are nonspecific but may reflect minor chronic microvascular ischemic changes. No abnormal enhancement.   Vascular: Major vessel flow voids at the skull base are preserved.   Skull and upper cervical spine: Normal marrow signal is preserved.   Sinuses/Orbits: Trace mucosal thickening.  Orbits are  unremarkable.   Other: Sella is unremarkable.  Mastoid air cells are clear.   IMPRESSION: No evidence of recent infarction, hemorrhage, or mass. No abnormal enhancement.   Minor chronic microvascular ischemic changes.     Latest Ref Rng & Units 03/17/2021   12:00 AM 11/27/2019    9:12 AM 09/11/2019    7:23 AM  CBC  WBC 3.8 - 10.8 Thousand/uL 7.2  6.6  5.5   Hemoglobin 13.2 - 17.1 g/dL 29.5  62.1  30.8   Hematocrit 38.5 - 50.0 % 51.3  51.4  50.0   Platelets 140 - 400 Thousand/uL 210  200.0  204       Latest Ref Rng & Units 02/23/2023   10:49 AM 11/08/2022    1:41 PM 04/29/2022   12:00 AM  CMP  Glucose 70 - 99 mg/dL 88  83  82   BUN 8 - 27 mg/dL 15  21  16    Creatinine 0.76 - 1.27 mg/dL 6.57  8.46  9.62   Sodium 134 - 144 mmol/L 139  139  139   Potassium 3.5 - 5.2 mmol/L 4.7  4.6  4.8   Chloride 96 - 106 mmol/L 103  103  105   CO2 20 - 29 mmol/L 22  28  29    Calcium 8.6 - 10.2 mg/dL 9.6  9.2  9.4   Total Protein 6.1 - 8.1 g/dL  6.3    Total Bilirubin 0.2 - 1.2 mg/dL  0.5    AST 10 - 35 U/L  23    ALT 9 - 46 U/L  23       Review of Systems: Patient complains of symptoms per HPI as well as the following symptoms none. Pertinent negatives and positives per HPI. All others negative.   Social History   Socioeconomic History   Marital status: Married    Spouse name: Peggy   Number of children: 2   Years of education: 15   Highest education level: Some college, no degree  Occupational History   Occupation: Retired    Associate Professor: [employer]  Tobacco Use   Smoking status: Never   Smokeless tobacco: Never  Vaping Use   Vaping status: Never Used  Substance and Sexual Activity   Alcohol use: Yes    Alcohol/week: 1.0 standard drink of alcohol    Types: 1 Glasses of wine per week    Comment: Less than one drink per month   Drug use: Never   Sexual activity: Not Currently    Partners: Female    Birth control/protection: None    Comment: route sales for [employer], HS degree,  5 yrs college, married, 2 adult  kids, doesn't exercise regularly, 2-3 caffeinated drinks per day.  Other Topics Concern   Not on file  Social History Narrative   Lives with his wife and their daughter. He enjoys reading, watching t.v. and going to the gym.   Social Drivers of Corporate investment banker Strain: Low Risk  (04/02/2023)   Overall Financial Resource Strain (CARDIA)  Difficulty of Paying Living Expenses: Not hard at all  Food Insecurity: No Food Insecurity (04/02/2023)   Hunger Vital Sign    Worried About Running Out of Food in the Last Year: Never true    Ran Out of Food in the Last Year: Never true  Transportation Needs: No Transportation Needs (04/02/2023)   PRAPARE - Administrator, Civil Service (Medical): No    Lack of Transportation (Non-Medical): No  Physical Activity: Sufficiently Active (04/02/2023)   Exercise Vital Sign    Days of Exercise per Week: 3 days    Minutes of Exercise per Session: 60 min  Stress: No Stress Concern Present (04/02/2023)   [employer] of Occupational Health - Occupational Stress Questionnaire    Feeling of Stress : Not at all  Social Connections: Moderately Integrated (04/06/2023)   Social Connection and Isolation Panel [NHANES]    Frequency of Communication with Friends and Family: Never    Frequency of Social Gatherings with Friends and Family: Once a week    Attends Religious Services: More than 4 times per year    Active Member of Clubs or Organizations: Yes    Attends Banker Meetings: 1 to 4 times per year    Marital Status: Married  Catering manager Violence: Not At Risk (04/06/2023)   Humiliation, Afraid, Rape, and Kick questionnaire    Fear of Current or Ex-Partner: No    Emotionally Abused: No    Physically Abused: No    Sexually Abused: No    Family History  Problem Relation Age of Onset   Heart disease Father    Hypertension Mother    Arthritis Mother    Prostate cancer Maternal Uncle         Deceased   Depression Maternal Uncle    Cancer Paternal Grandfather    Cancer Paternal Grandmother    Colon cancer Neg Hx    Stomach cancer Neg Hx    Colon polyps Neg Hx    Esophageal cancer Neg Hx     Past Medical History:  Diagnosis Date   Arthritis    CHF (congestive heart failure) (HCC)    stents   Coronary artery disease    stents   GERD (gastroesophageal reflux disease)    Hyperlipidemia    Hypertension since 2007   Kidney stone 2022   Kidney stones 2009   Myocardial infarction (HCC) 12/2015   Oxygen deficiency    only during heartattack    Patient Active Problem List   Diagnosis Date Noted   BPPV (benign paroxysmal positional vertigo), left 02/14/2023   Nondisplaced right eighth rib fracture 06/30/2022   Left wrist injury 06/30/2022   Greater trochanteric bursitis, left 01/29/2022   Actinic keratosis 03/21/2021   Kidney stone 03/21/2021   Family history of factor V deficiency 09/15/2020   Purpura senilis (HCC) 03/17/2020   Proteins serum plasma low 03/17/2020   Venous stasis 01/28/2020   Schatzki's ring 09/10/2019   Osteoarthritis of first metatarsophalangeal (MTP) joint of right foot 05/24/2017   Primary osteoarthritis of left hand 12/10/2016   Coronary artery disease involving native coronary artery of native heart without angina pectoris 01/03/2016   Status post insertion of drug eluting coronary artery stent 01/03/2016   STEMI (ST elevation myocardial infarction) (HCC) 01/03/2016   Complete heart block (HCC) 01/03/2016   Syncope 01/03/2016   Subscapularis tendinitis of left shoulder 10/25/2014   Hyperlipidemia 08/12/2014   Extensor intersection syndrome of both wrists 08/02/2013   Cough 08/02/2013  Allergic reaction 02/08/2013   Baker's cyst, left 01/11/2013   Solitary pulmonary nodule 11/14/2012   Arthritis of carpometacarpal joint 10/17/2012   HYPOGONADISM 06/04/2009   HYPERTENSION, BENIGN 06/04/2009   GERD 06/04/2009   ERECTILE  DYSFUNCTION, ORGANIC 06/04/2009    Past Surgical History:  Procedure Laterality Date   CORONARY ANGIOPLASTY WITH STENT PLACEMENT  12/2015   KNEE ARTHROSCOPY  12/31/2010   Right knee    TONSILLECTOMY  1957    Current Outpatient Medications  Medication Sig Dispense Refill   ASPIRIN ADULT PO Take 81 mg by mouth.      atorvastatin (LIPITOR) 80 MG tablet Take 80 mg by mouth at bedtime.     b complex vitamins tablet Take 1 tablet by mouth daily.     Cholecalciferol 125 MCG (5000 UT) capsule Take 5,000 Units by mouth daily.     Glucosamine-Chondroit-Vit C-Mn (GLUCOSAMINE CHONDR 1500 COMPLX) CAPS Take by mouth daily.     loratadine (CLARITIN) 10 MG tablet Take 10 mg by mouth daily.     losartan (COZAAR) 100 MG tablet TAKE 1 TABLET BY MOUTH EVERY DAY 90 tablet 1   meclizine (ANTIVERT) 25 MG tablet Take 25 mg by mouth every 6 (six) hours as needed for dizziness.     metoprolol tartrate (LOPRESSOR) 25 MG tablet Take 12.5 mg by mouth 2 (two) times daily.     Multiple Vitamin (MULTIVITAMIN) tablet Take 1 tablet by mouth daily.     nitroGLYCERIN (NITROSTAT) 0.4 MG SL tablet Place 1 tablet (0.4 mg total) under the tongue every 5 (five) minutes as needed. 25 tablet 1   ondansetron (ZOFRAN-ODT) 4 MG disintegrating tablet Take 4 mg by mouth every 6 (six) hours as needed for nausea or vomiting.     pantoprazole (PROTONIX) 40 MG tablet TAKE ONE TABLET BY MOUTH DAILY. 90 tablet 3   terbinafine (ATHLETES FOOT, TERBINAFINE,) 1 % cream Apply 1 Application topically daily. To Groin area x 3-4 weeks. 30 g 1   vitamin C (ASCORBIC ACID) 500 MG tablet Take 1,000 mg by mouth daily.     No current facility-administered medications for this visit.    Allergies as of 06/09/2023 - Review Complete 04/06/2023  Allergen Reaction Noted   Celebrex [celecoxib]  06/04/2009   Lisinopril Other (See Comments) 03/13/2019   Sulfa antibiotics  01/03/2016   Sulfonamide derivatives  06/04/2009   Tamsulosin Itching and Rash  03/20/2021    Vitals: Today's Vitals   06/09/23 1516  BP: 121/75  Pulse: 62  Weight: 222 lb (100.7 kg)  Height: 5\' 10"  (1.778 m)   Body mass index is 31.85 kg/m.  Physical exam: Exam: Gen: NAD, very pleasant elderly Caucasian male, conversant, well nourised, obese, well groomed                     CV: RRR, no MRG. No Carotid Bruits. No peripheral edema, warm, nontender Eyes: Conjunctivae clear without exudates or hemorrhage  Neuro: Detailed Neurologic Exam  Speech:    Speech is normal; fluent and spontaneous with normal comprehension.  Cognition:    The patient is oriented to person, place, and time;     recent and remote memory intact;     language fluent;     normal attention, concentration,     fund of knowledge Cranial Nerves:    The pupils are equal, round, and reactive to light. ONH without edema Visual fields are full to finger confrontation. Extraocular movements are intact. The face is symmetric. The  palate elevates in the midline. Hearing intact. Voice is normal. Shoulder shrug is normal. The tongue has normal motion without fasciculations.   Coordination:    Normal finger to nose and heel to shin. Normal rapid alternating movements.   Gait:    Stands from seated position without difficulty.  Normal stride length and balance.  Heel-toe and tandem gait are essentially normal although some difficulty due to hx of right foot hardware.   Motor Observation:    No asymmetry, no atrophy, and no involuntary movements noted. Normal muscle tone.    Posture:    Posture is normal. normal erect    Strength:    Strength is V/V in the upper and lower limbs.      Sensation: intact to LT     Reflex Exam:  DTR's:    Deep tendon reflexes in the upper and lower extremities are normal bilaterally.   Toes:    The toes are downgoing bilaterally.   Clonus:    Clonus is absent.      Assessment/Plan:  Patient with episodes of vertigo associated with vision changes and  pulsatile tinnitus.  MRI brain normal.  CTA head unremarkable.  Improvement of vertigo after completing vestibular therapy, still has occasional tinnitus and vision changes (stationary objects appear to be moving if focusing on single object for too long).    -Continue meclizine daily but recommend repeat trials of discontinuing as able to eventually be able to take as needed -He does have some features of both BPPV and Mnire's disease -discussed trying to modify diet such as limiting sodium and caffeine intake and avoiding MSG's. Can consider ENT evaluation in the future if needed -Continue to do exercises at home as advised by therapy, advised to call with any worsening symptoms for reevaluation   Can follow-up as needed at this time    Cc: Agapito Games, *,  Agapito Games, MD  I spent 30 minutes of face-to-face and non-face-to-face time with patient.  This included previsit chart review, lab review, study review, order entry, electronic health record documentation, patient education and discussion regarding above diagnoses and treatment plan and answered all other questions to patient's satisfaction  Ihor Austin, [facility]  [facility] [address] [place], [place] [address]  Phone [phone] Fax [phone] Note: This document was prepared with digital dictation and possible smart phrase technology. Any transcriptional errors that result from this process are unintentional.

## 2023-07-27 DIAGNOSIS — M9903 Segmental and somatic dysfunction of lumbar region: Secondary | ICD-10-CM | POA: Diagnosis not present

## 2023-07-27 DIAGNOSIS — M5451 Vertebrogenic low back pain: Secondary | ICD-10-CM | POA: Diagnosis not present

## 2023-07-27 DIAGNOSIS — M542 Cervicalgia: Secondary | ICD-10-CM | POA: Diagnosis not present

## 2023-07-27 DIAGNOSIS — M9901 Segmental and somatic dysfunction of cervical region: Secondary | ICD-10-CM | POA: Diagnosis not present

## 2023-08-29 ENCOUNTER — Encounter: Payer: Self-pay | Admitting: Family Medicine

## 2023-08-29 ENCOUNTER — Ambulatory Visit (INDEPENDENT_AMBULATORY_CARE_PROVIDER_SITE_OTHER): Payer: Medicare Other | Admitting: Family Medicine

## 2023-08-29 VITALS — BP 118/66 | HR 63 | Ht 70.0 in | Wt 224.0 lb

## 2023-08-29 DIAGNOSIS — Z0000 Encounter for general adult medical examination without abnormal findings: Secondary | ICD-10-CM | POA: Diagnosis not present

## 2023-08-29 DIAGNOSIS — I10 Essential (primary) hypertension: Secondary | ICD-10-CM | POA: Diagnosis not present

## 2023-08-29 DIAGNOSIS — E785 Hyperlipidemia, unspecified: Secondary | ICD-10-CM | POA: Diagnosis not present

## 2023-08-29 NOTE — Progress Notes (Signed)
 Complete physical exam  Patient: Joshua Warner   DOB: October 07, 1950   73 y.o. Male  MRN: [id number]  Subjective:    Chief Complaint  Patient presents with   Annual Exam    Joshua Warner is a 73 y.o. male who presents today for a complete physical exam. He reports consuming a general diet. Gym/ health club routine includes 3 days per week with treadmill, biking, and recently has added in rowing. He generally feels well.  He does not have additional problems to discuss today.   Says he occasionally gets short of breath at home walking up the stairs or with certain activities.  But he does not feel like his breathing impairs his workout at the gym.  No recent chest pain or shortness of breath.  He always gets more swelling on his left ankle compared to his right he has a diagnosis of venous stasis.  Most recent fall risk assessment:    08/29/2023    2:18 PM  Fall Risk   Falls in the past year? 1  Number falls in past yr: 1  Injury with Fall? 1  Risk for fall due to : History of fall(s)  Follow up Falls evaluation completed     Most recent depression screenings:    08/29/2023    2:18 PM 04/06/2023    8:10 AM  PHQ 2/9 Scores  PHQ - 2 Score 0 0         Patient Care Team: Agapito Games, MD as PCP - General Preli, Maryella Shivers, MD as Referring Physician (Cardiology)   Outpatient Medications Prior to Visit  Medication Sig   ASPIRIN ADULT PO Take 81 mg by mouth.    atorvastatin (LIPITOR) 80 MG tablet TAKE 1 TABLET BY MOUTH EVERY DAY   b complex vitamins tablet Take 1 tablet by mouth daily.   Cholecalciferol 125 MCG (5000 UT) capsule Take 5,000 Units by mouth daily.   Glucosamine-Chondroit-Vit C-Mn (GLUCOSAMINE CHONDR 1500 COMPLX) CAPS Take by mouth daily.   loratadine (CLARITIN) 10 MG tablet Take 10 mg by mouth daily.   losartan (COZAAR) 100 MG tablet TAKE 1 TABLET BY MOUTH EVERY DAY   meclizine (ANTIVERT) 25 MG tablet Take 25 mg by mouth as needed for dizziness.    metoprolol tartrate (LOPRESSOR) 25 MG tablet Take 12.5 mg by mouth 2 (two) times daily.   Multiple Vitamin (MULTIVITAMIN) tablet Take 2 tablets by mouth daily.   nitroGLYCERIN (NITROSTAT) 0.4 MG SL tablet Place 1 tablet (0.4 mg total) under the tongue every 5 (five) minutes as needed.   ondansetron (ZOFRAN-ODT) 4 MG disintegrating tablet Take 4 mg by mouth every 6 (six) hours as needed for nausea or vomiting.   pantoprazole (PROTONIX) 40 MG tablet TAKE ONE TABLET BY MOUTH DAILY. (Patient taking differently: Take 40 mg by mouth as needed.)   terbinafine (ATHLETES FOOT, TERBINAFINE,) 1 % cream Apply 1 Application topically daily. To Groin area x 3-4 weeks.   vitamin C (ASCORBIC ACID) 500 MG tablet Take 1,000 mg by mouth daily.   No facility-administered medications prior to visit.    ROS        Objective:     BP 118/66   Pulse 63   Ht 5\' 10"  (1.778 m)   Wt 224 lb (101.6 kg)   SpO2 96%   BMI 32.14 kg/m     Physical Exam Constitutional:      Appearance: Normal appearance.  HENT:     Head: Normocephalic and atraumatic.  Right Ear: Tympanic membrane, ear canal and external ear normal.     Left Ear: Tympanic membrane, ear canal and external ear normal.     Nose: Nose normal.     Mouth/Throat:     Pharynx: Oropharynx is clear.  Eyes:     Extraocular Movements: Extraocular movements intact.     Conjunctiva/sclera: Conjunctivae normal.     Pupils: Pupils are equal, round, and reactive to light.  Neck:     Thyroid: No thyromegaly.  Cardiovascular:     Rate and Rhythm: Normal rate and regular rhythm.  Pulmonary:     Effort: Pulmonary effort is normal.     Breath sounds: Normal breath sounds.  Abdominal:     General: Bowel sounds are normal.     Palpations: Abdomen is soft.     Tenderness: There is no abdominal tenderness.  Musculoskeletal:        General: No swelling.     Cervical back: Neck supple.  Skin:    General: Skin is warm and dry.  Neurological:     Mental  Status: He is oriented to person, place, and time.  Psychiatric:        Mood and Affect: Mood normal.        Behavior: Behavior normal.      No results found for any visits on 08/29/23.      Assessment & Plan:    Routine Health Maintenance and Physical Exam  Immunization History  Administered Date(s) Administered   Fluad Quad(high Dose 65+) 03/13/2019, 03/17/2020, 03/20/2021   Influenza Split 03/28/2012, 03/28/2012   Influenza Whole 04/28/2009, 03/28/2010   Influenza, High Dose Seasonal PF 03/04/2017   Influenza, Seasonal, Injecte, Preservative Fre 03/22/2014, 03/28/2015, 02/20/2016   Influenza,inj,Quad PF,6+ Mos 03/22/2014, 03/28/2015, 02/20/2016, 03/13/2018   Influenza-Unspecified 04/19/2013, 03/16/2022, 02/14/2023   Moderna SARS-COV2 Booster Vaccination 05/01/2020, 01/01/2021   Moderna Sars-Covid-2 Vaccination 07/30/2019, 08/27/2019   Pneumococcal Conjugate-13 03/04/2017   Pneumococcal Polysaccharide-23 09/11/2018   Td 04/28/2008   Tdap 09/13/2018   Zoster Recombinant(Shingrix) 09/14/2021, 12/09/2021   Zoster, Live 12/14/2013    Health Maintenance  Topic Date Due   COVID-19 Vaccine (3 - Moderna risk series) 02/26/2024 (Originally 01/29/2021)   Medicare Annual Wellness (AWV)  04/05/2024   DTaP/Tdap/Td (3 - Td or Tdap) 09/12/2028   Colonoscopy  12/09/2029   Pneumonia Vaccine 21+ Years old  Completed   INFLUENZA VACCINE  Completed   Hepatitis C Screening  Completed   Zoster Vaccines- Shingrix  Completed   HPV VACCINES  Aged Out    Discussed health benefits of physical activity, and encouraged him to engage in regular exercise appropriate for his age and condition.  Problem List Items Addressed This Visit       Cardiovascular and Mediastinum   HYPERTENSION, BENIGN   Relevant Orders   Lipid panel   CMP14+EGFR   CBC with Differential/Platelet     Other   Hyperlipidemia   Relevant Orders   Lipid panel   CMP14+EGFR   CBC with Differential/Platelet   Other  Visit Diagnoses       Wellness examination    -  Primary       Keep up a regular exercise program and make sure you are eating a healthy diet Try to eat 4 servings of dairy a day, or if you are lactose intolerant take a calcium with vitamin D daily.  Your vaccines are up to date. \   Return in about 6 months (around 02/29/2024) for bp.  Nani Gasser, MD

## 2023-08-29 NOTE — Progress Notes (Deleted)
   Established Patient Office Visit  Subjective  Patient ID: Grier Vu, male    DOB: 06-14-1951  Age: 73 y.o. MRN: [id number]  Chief Complaint  Patient presents with   Annual Exam    HPI  {History (Optional):23778}  ROS    Objective:     BP 118/66   Pulse 63   Ht 5\' 10"  (1.778 m)   Wt 224 lb (101.6 kg)   SpO2 96%   BMI 32.14 kg/m  {Vitals History (Optional):23777}  Physical Exam   No results found for any visits on 08/29/23.  {Labs (Optional):23779}  The ASCVD Risk score (Arnett DK, et al., 2019) failed to calculate for the following reasons:   Risk score cannot be calculated because patient has a medical history suggesting prior/existing ASCVD    Assessment & Plan:   Problem List Items Addressed This Visit   None   No follow-ups on file.    Nani Gasser, MD

## 2023-08-30 ENCOUNTER — Encounter: Payer: Self-pay | Admitting: Family Medicine

## 2023-08-30 LAB — CMP14+EGFR
ALT: 27 IU/L (ref 0–44)
AST: 30 IU/L (ref 0–40)
Albumin: 4.2 g/dL (ref 3.8–4.8)
Alkaline Phosphatase: 110 IU/L (ref 44–121)
BUN/Creatinine Ratio: 16 (ref 10–24)
BUN: 17 mg/dL (ref 8–27)
Bilirubin Total: 0.5 mg/dL (ref 0.0–1.2)
CO2: 24 mmol/L (ref 20–29)
Calcium: 9.3 mg/dL (ref 8.6–10.2)
Chloride: 102 mmol/L (ref 96–106)
Creatinine, Ser: 1.04 mg/dL (ref 0.76–1.27)
Globulin, Total: 1.9 g/dL (ref 1.5–4.5)
Glucose: 102 mg/dL — ABNORMAL HIGH (ref 70–99)
Potassium: 4.6 mmol/L (ref 3.5–5.2)
Sodium: 139 mmol/L (ref 134–144)
Total Protein: 6.1 g/dL (ref 6.0–8.5)
eGFR: 77 mL/min/{1.73_m2} (ref >59)

## 2023-08-30 LAB — CBC WITH DIFFERENTIAL/PLATELET
Basophils Absolute: 0 10*3/uL (ref 0.0–0.2)
Basos: 0 %
EOS (ABSOLUTE): 0.3 10*3/uL (ref 0.0–0.4)
Eos: 4 %
Hematocrit: 50.3 % (ref 37.5–51.0)
Hemoglobin: 17.3 g/dL (ref 13.0–17.7)
Immature Grans (Abs): 0 10*3/uL (ref 0.0–0.1)
Immature Granulocytes: 0 %
Lymphocytes Absolute: 2.7 10*3/uL (ref 0.7–3.1)
Lymphs: 38 %
MCH: 31.5 pg (ref 26.6–33.0)
MCHC: 34.4 g/dL (ref 31.5–35.7)
MCV: 92 fL (ref 79–97)
Monocytes Absolute: 0.6 10*3/uL (ref 0.1–0.9)
Monocytes: 8 %
Neutrophils Absolute: 3.5 10*3/uL (ref 1.4–7.0)
Neutrophils: 50 %
Platelets: 203 10*3/uL (ref 150–450)
RBC: 5.49 x10E6/uL (ref 4.14–5.80)
RDW: 12.4 % (ref 11.6–15.4)
WBC: 7.1 10*3/uL (ref 3.4–10.8)

## 2023-08-30 LAB — LIPID PANEL
Chol/HDL Ratio: 2.6 ratio (ref 0.0–5.0)
Cholesterol, Total: 102 mg/dL (ref 100–199)
HDL: 39 mg/dL — ABNORMAL LOW (ref >39)
LDL Chol Calc (NIH): 44 mg/dL (ref 0–99)
Triglycerides: 99 mg/dL (ref 0–149)
VLDL Cholesterol Cal: 19 mg/dL (ref 5–40)

## 2023-08-30 NOTE — Progress Notes (Signed)
 Hi Mahdi,  Total cholesterol and LDL look great.  Metabolic panel looks good blood count is normal.

## 2023-10-03 DIAGNOSIS — M9901 Segmental and somatic dysfunction of cervical region: Secondary | ICD-10-CM | POA: Diagnosis not present

## 2023-10-03 DIAGNOSIS — M9903 Segmental and somatic dysfunction of lumbar region: Secondary | ICD-10-CM | POA: Diagnosis not present

## 2023-10-03 DIAGNOSIS — M5451 Vertebrogenic low back pain: Secondary | ICD-10-CM | POA: Diagnosis not present

## 2023-10-03 DIAGNOSIS — M542 Cervicalgia: Secondary | ICD-10-CM | POA: Diagnosis not present

## 2023-10-10 ENCOUNTER — Ambulatory Visit: Payer: Self-pay

## 2023-10-10 NOTE — Telephone Encounter (Signed)
 Chief Complaint: Requesting change of BP medication  Symptoms: ongoing vertigo, ankle swelling, tinnitus Frequency: ongoing Pertinent Negatives: Patient denies shortness of breath, chest pain, sudden/new weakness of arms, legs, or face, blurred vision Disposition: [] ED /[] Urgent Care (no appt availability in office) / [x] Appointment(In office/virtual)/ []  [facility]/ [] Home Care/ [] Refused Recommended Disposition /[] [facility]/ []  Follow-up with PCP Additional Notes: Patient called in requesting appt with PCP to discuss changing his blood pressure medication to a new type. Patient states he has had continued spells of vertigo, ankle swelling, and tinnitus. Patient states he has seen neurology and ruled out major issues, but states he was told he may have Meneires disease. Patient appt made for tomorrow to discuss changes and evaluation.   Copied from CRM [id number]. Topic: Clinical - Red Word Triage >> Oct 10, 2023  8:41 AM Danelle Dunning F wrote: [facility] that prompted transfer to Nurse Triage:   Minor swelling ankles; vertigo; tenitis Answer Assessment - Initial Assessment Questions 1. SYMPTOM: "What is the main symptom you are concerned about?" (e.g., weakness, numbness)     Unbalanced 2. ONSET: "When did this start?" (minutes, hours, days; while sleeping)     Thursday 3. LAST NORMAL: "When was the last time you (the patient) were normal (no symptoms)?"     Thursday 4. PATTERN "Does this come and go, or has it been constant since it started?"  "Is it present now?"     Comes and goes  5. CARDIAC SYMPTOMS: "Have you had any of the following symptoms: chest pain, difficulty breathing, palpitations?"     No  6. NEUROLOGIC SYMPTOMS: "Have you had any of the following symptoms: headache, dizziness, vision loss, double vision, changes in speech, unsteady on your feet?"     No 7. OTHER SYMPTOMS: "Do you have any other symptoms?"     No  Protocols used: Neurologic  Deficit-A-AH

## 2023-10-10 NOTE — Telephone Encounter (Signed)
 Patient scheduled 10/11/23 with Dr. Greer Leak

## 2023-10-11 ENCOUNTER — Telehealth (INDEPENDENT_AMBULATORY_CARE_PROVIDER_SITE_OTHER): Admitting: Family Medicine

## 2023-10-11 ENCOUNTER — Other Ambulatory Visit: Payer: Self-pay | Admitting: Family Medicine

## 2023-10-11 ENCOUNTER — Encounter: Payer: Self-pay | Admitting: Family Medicine

## 2023-10-11 DIAGNOSIS — H8110 Benign paroxysmal vertigo, unspecified ear: Secondary | ICD-10-CM | POA: Diagnosis not present

## 2023-10-11 DIAGNOSIS — I2510 Atherosclerotic heart disease of native coronary artery without angina pectoris: Secondary | ICD-10-CM | POA: Diagnosis not present

## 2023-10-11 DIAGNOSIS — H9313 Tinnitus, bilateral: Secondary | ICD-10-CM

## 2023-10-11 DIAGNOSIS — I10 Essential (primary) hypertension: Secondary | ICD-10-CM | POA: Diagnosis not present

## 2023-10-11 MED ORDER — ONDANSETRON 4 MG PO TBDP: 4.0000 mg | ORAL_TABLET | Freq: Every day | ORAL | 1 refills | Status: AC | PRN

## 2023-10-11 MED ORDER — TRIAMTERENE-HCTZ 37.5-25 MG PO TABS
1.0000 | ORAL_TABLET | Freq: Every day | ORAL | 2 refills | Status: DC
Start: 2023-10-11 — End: 2023-12-08

## 2023-10-11 MED ORDER — NITROGLYCERIN 0.4 MG SL SUBL: 0.4000 mg | SUBLINGUAL_TABLET | SUBLINGUAL | 0 refills | Status: AC | PRN

## 2023-10-11 NOTE — Progress Notes (Signed)
 Virtual Visit via Video Note  I connected with Joshua Warner on 10/11/23 at 10:10 AM EDT by a video enabled telemedicine application and verified that I am speaking with the correct person using two identifiers.   I discussed the limitations of evaluation and management by telemedicine and the availability of in person appointments. The patient expressed understanding and agreed to proceed.  Patient location: at home Provider location: in office  Subjective:    CC:  No chief complaint on file.   HPI: He has been having a recurrence of vertigo.  He said that he had an episode February 7 then March 26 and then it started again on April 10.  But this time it has persisted for several days.  Says he feels like his head just wants to keep moving.  Has been taking his meclizine as needed.  He did have an MRI and carotid ultrasounds back in the fall.  He said that the PA that he worked with at the neurologist office had suggested that he could possibly have Mnire's.  And had recommended an ENT referral at that time.  He is interested in potentially trying Maxide just to see if that helps plus he gets some lower extremity edema and he has been tried to wear his compression stockings when he goes to church and is gena be up and about for several hours.    Past medical history, Surgical history, Family history not pertinant except as noted below, Social history, Allergies, and medications have been entered into the medical record, reviewed, and corrections made.    Objective:    General: Speaking clearly in complete sentences without any shortness of breath.  Alert and oriented x3.  Normal judgment. No apparent acute distress.    Impression and Recommendations:    Problem List Items Addressed This Visit       Cardiovascular and Mediastinum   HYPERTENSION, BENIGN - Primary   Discussed that if we start the Maxide to see if it helps with the vertigo and tinnitus then he will have to  cut the losartan in half so that we do not overmedicate him and drop his blood pressure too low.      Relevant Medications   triamterene-hydrochlorothiazide (MAXZIDE-25) 37.5-25 MG tablet   nitroGLYCERIN (NITROSTAT) 0.4 MG SL tablet   Coronary artery disease involving native coronary artery of native heart without angina pectoris   He says his nitroglycerin is about to expire and needs an updated prescription.  Continue aspirin and atorvastatin.      Relevant Medications   triamterene-hydrochlorothiazide (MAXZIDE-25) 37.5-25 MG tablet   nitroGLYCERIN (NITROSTAT) 0.4 MG SL tablet   Other Visit Diagnoses       Benign paroxysmal positional vertigo, unspecified laterality       Relevant Medications   triamterene-hydrochlorothiazide (MAXZIDE-25) 37.5-25 MG tablet   Other Relevant Orders   Ambulatory referral to Physical Therapy     Tinnitus of both ears       Relevant Medications   triamterene-hydrochlorothiazide (MAXZIDE-25) 37.5-25 MG tablet   Other Relevant Orders   Ambulatory referral to Physical Therapy       Orders Placed This Encounter  Procedures   Ambulatory referral to Physical Therapy    Referral Priority:   Routine    Referral Type:   Physical Medicine    Referral Reason:   Specialty Services Required    Requested Specialty:   Physical Therapy    Number of Visits Requested:   1  Meds ordered this encounter  Medications   triamterene-hydrochlorothiazide (MAXZIDE-25) 37.5-25 MG tablet    Sig: Take 1 tablet by mouth daily.    Dispense:  30 tablet    Refill:  2   ondansetron (ZOFRAN-ODT) 4 MG disintegrating tablet    Sig: Take 1 tablet (4 mg total) by mouth daily as needed for nausea or vomiting.    Dispense:  20 tablet    Refill:  1   nitroGLYCERIN (NITROSTAT) 0.4 MG SL tablet    Sig: Place 1 tablet (0.4 mg total) under the tongue every 5 (five) minutes as needed.    Dispense:  25 tablet    Refill:  0     I discussed the assessment and treatment plan  with the patient. The patient was provided an opportunity to ask questions and all were answered. The patient agreed with the plan and demonstrated an understanding of the instructions.   The patient was advised to call back or seek an in-person evaluation if the symptoms worsen or if the condition fails to improve as anticipated.   Duaine German, MD

## 2023-10-11 NOTE — Assessment & Plan Note (Signed)
 Discussed that if we start the Maxide to see if it helps with the vertigo and tinnitus then he will have to cut the losartan in half so that we do not overmedicate him and drop his blood pressure too low.

## 2023-10-11 NOTE — Assessment & Plan Note (Signed)
 He says his nitroglycerin is about to expire and needs an updated prescription.  Continue aspirin and atorvastatin.

## 2023-10-12 ENCOUNTER — Ambulatory Visit: Admitting: Physical Therapy

## 2023-10-13 ENCOUNTER — Ambulatory Visit
Admission: RE | Admit: 2023-10-13 | Discharge: 2023-10-13 | Disposition: A | Discharge status: Home / Self Care | Source: Ambulatory Visit | Attending: Family Medicine | Admitting: Family Medicine

## 2023-10-13 ENCOUNTER — Telehealth: Payer: Self-pay

## 2023-10-13 VITALS — BP 118/84 | HR 68 | Temp 99.2°F | Resp 16 | Ht 70.0 in | Wt 217.0 lb

## 2023-10-13 DIAGNOSIS — U071 COVID-19: Secondary | ICD-10-CM

## 2023-10-13 DIAGNOSIS — R051 Acute cough: Secondary | ICD-10-CM | POA: Diagnosis not present

## 2023-10-13 LAB — POC SARS CORONAVIRUS 2 AG -  ED: SARS Coronavirus 2 Ag: NEGATIVE

## 2023-10-13 LAB — POC COVID19/FLU A&B COMBO
Covid Antigen, POC: POSITIVE — AB
Influenza A Antigen, POC: NEGATIVE
Influenza B Antigen, POC: NEGATIVE

## 2023-10-13 MED ORDER — HYDROCOD POLI-CHLORPHE POLI ER 10-8 MG/5ML PO SUER: 5.0000 mL | Freq: Two times a day (BID) | ORAL | 0 refills | Status: DC | PRN

## 2023-10-13 MED ORDER — PAXLOVID (300/100) 20 X 150 MG & 10 X 100MG PO TBPK: 3.0000 | ORAL_TABLET | Freq: Two times a day (BID) | ORAL | 0 refills | Status: AC

## 2023-10-13 NOTE — ED Provider Notes (Signed)
 [facility]    CSN: [id number] Arrival date & time: 10/13/23  0930      History   Chief Complaint Chief Complaint  Patient presents with   Cough    I tested negative for Covid on Tuesday - Entered by patient    HPI Joshua Warner is a 73 y.o. male.   HPI  Pleasant 73 year old gentleman who is here for an upper respiratory infection.  He states he has postnasal drip and cough.  Sore throat.  Congestion.  He states he was directly exposed to COVID and late, did a COVID test on Tuesday.  The COVID test was negative.  It was less than 24 hours after symptoms had started.  He does feel tired.  Some headache and bodyaches.  Has not noted any fever. He has problems with chronic cough from postnasal drip.  He also has a history of vertigo.  He is under the care of Dr. Linford Arnold. Patient has controlled hypertension, hyperlipidemia, and heart disease.  Also GERD.  Past Medical History:  Diagnosis Date   Arthritis    CHF (congestive heart failure) (HCC)    stents   Coronary artery disease    stents   GERD (gastroesophageal reflux disease)    Hyperlipidemia    Hypertension since 2007   Kidney stone 2022   Kidney stones 2009   Myocardial infarction (HCC) 12/2015   Oxygen deficiency    only during heartattack    Patient Active Problem List   Diagnosis Date Noted   BPPV (benign paroxysmal positional vertigo), left 02/14/2023   Nondisplaced right eighth rib fracture 06/30/2022   Left wrist injury 06/30/2022   Greater trochanteric bursitis, left 01/29/2022   Actinic keratosis 03/21/2021   Kidney stone 03/21/2021   Family history of factor V deficiency 09/15/2020   Purpura senilis (HCC) 03/17/2020   Proteins serum plasma low 03/17/2020   Venous stasis 01/28/2020   Schatzki's ring 09/10/2019   Osteoarthritis of first metatarsophalangeal (MTP) joint of right foot 05/24/2017   Primary osteoarthritis of left hand 12/10/2016   Coronary artery disease involving native  coronary artery of native heart without angina pectoris 01/03/2016   Status post insertion of drug eluting coronary artery stent 01/03/2016   STEMI (ST elevation myocardial infarction) (HCC) 01/03/2016   Complete heart block (HCC) 01/03/2016   Syncope 01/03/2016   Subscapularis tendinitis of left shoulder 10/25/2014   Hyperlipidemia 08/12/2014   Extensor intersection syndrome of both wrists 08/02/2013   Cough 08/02/2013   Allergic reaction 02/08/2013   Baker's cyst, left 01/11/2013   Solitary pulmonary nodule 11/14/2012   Arthritis of carpometacarpal joint 10/17/2012   HYPOGONADISM 06/04/2009   HYPERTENSION, BENIGN 06/04/2009   GERD 06/04/2009   ERECTILE DYSFUNCTION, ORGANIC 06/04/2009    Past Surgical History:  Procedure Laterality Date   CORONARY ANGIOPLASTY WITH STENT PLACEMENT  12/2015   KNEE ARTHROSCOPY  12/31/2010   Right knee    TONSILLECTOMY  1957       Home Medications    Prior to Admission medications   Medication Sig Start Date End Date Taking? Authorizing Provider  ASPIRIN ADULT PO Take 81 mg by mouth.    Yes [provider]  atorvastatin (LIPITOR) 80 MG tablet TAKE 1 TABLET BY MOUTH EVERY DAY 06/17/23  Yes Agapito Games, MD  b complex vitamins tablet Take 1 tablet by mouth daily.   Yes [provider]  chlorpheniramine-HYDROcodone (TUSSIONEX) 10-8 MG/5ML Take 5 mLs by mouth every 12 (twelve) hours as needed for  cough. 10/13/23  Yes Eustace Moore, MD  Cholecalciferol 125 MCG (5000 UT) capsule Take 5,000 Units by mouth daily.   Yes [provider]  Glucosamine-Chondroit-Vit C-Mn (GLUCOSAMINE CHONDR 1500 COMPLX) CAPS Take by mouth daily.   Yes [provider]  loratadine (CLARITIN) 10 MG tablet Take 10 mg by mouth daily.   Yes [provider]  losartan (COZAAR) 100 MG tablet TAKE 1 TABLET BY MOUTH EVERY DAY 05/09/23  Yes Agapito Games, MD  meclizine (ANTIVERT) 25 MG tablet Take 25 mg by mouth as  needed for dizziness. 09/30/22  Yes [provider]  metoprolol tartrate (LOPRESSOR) 25 MG tablet Take 12.5 mg by mouth 2 (two) times daily. 06/19/19  Yes [provider]  Multiple Vitamin (MULTIVITAMIN) tablet Take 2 tablets by mouth daily.   Yes [provider]  nirmatrelvir/ritonavir (PAXLOVID, 300/100,) 20 x 150 MG & 10 x 100MG  TBPK Take 3 tablets by mouth 2 (two) times daily for 5 days. Patient GFR is 77.Take nirmatrelvir (150 mg) two tablets twice daily for 5 days and ritonavir (100 mg) one tablet twice daily for 5 days. 10/13/23 10/18/23 Yes Eustace Moore, MD  nitroGLYCERIN (NITROSTAT) 0.4 MG SL tablet Place 1 tablet (0.4 mg total) under the tongue every 5 (five) minutes as needed. 10/11/23 10/10/24 Yes Agapito Games, MD  ondansetron (ZOFRAN-ODT) 4 MG disintegrating tablet Take 1 tablet (4 mg total) by mouth daily as needed for nausea or vomiting. 10/11/23  Yes Agapito Games, MD  pantoprazole (PROTONIX) 40 MG tablet TAKE ONE TABLET BY MOUTH DAILY. Patient taking differently: Take 40 mg by mouth as needed. 03/08/23  Yes Agapito Games, MD  triamterene-hydrochlorothiazide (MAXZIDE-25) 37.5-25 MG tablet Take 1 tablet by mouth daily. 10/11/23  Yes Agapito Games, MD  vitamin C (ASCORBIC ACID) 500 MG tablet Take 1,000 mg by mouth daily.   Yes [provider]    Family History Family History  Problem Relation Age of Onset   Heart disease Father    Hypertension Mother    Arthritis Mother    Prostate cancer Maternal Uncle        Deceased   Depression Maternal Uncle    Cancer Paternal Grandfather    Cancer Paternal Grandmother    Colon cancer Neg Hx    Stomach cancer Neg Hx    Colon polyps Neg Hx    Esophageal cancer Neg Hx     Social History Social History   Tobacco Use   Smoking status: Never   Smokeless tobacco: Never  Vaping Use   Vaping status: Never Used  Substance Use Topics   Alcohol use: Not Currently     Alcohol/week: 1.0 standard drink of alcohol    Types: 1 Glasses of wine per week    Comment: Less than one drink per month   Drug use: Never     Allergies   Celebrex [celecoxib], Lisinopril, Sulfa antibiotics, Sulfonamide derivatives, and Tamsulosin   Review of Systems Review of Systems See HPI  Physical Exam Triage Vital Signs ED Triage Vitals  Encounter Vitals Group     BP 10/13/23 0944 118/84     Systolic BP Percentile --      Diastolic BP Percentile --      Pulse Rate 10/13/23 0944 68     Resp 10/13/23 0944 16     Temp 10/13/23 0944 99.2 F (37.3 C)     Temp Source 10/13/23 0944 Oral     SpO2 10/13/23 0944  94 %     Weight 10/13/23 0942 217 lb (98.4 kg)     Height 10/13/23 0942 5\' 10"  (1.778 m)     Head Circumference --      Peak Flow --      Pain Score 10/13/23 0942 0     Pain Loc --      Pain Education --      Exclude from Growth Chart --    No data found.  Updated Vital Signs BP 118/84 (BP Location: Right Arm)   Pulse 68   Temp 99.2 F (37.3 C) (Oral)   Resp 16   Ht 5\' 10"  (1.778 m)   Wt 98.4 kg   SpO2 94%   BMI 31.14 kg/m      Physical Exam Constitutional:      General: He is not in acute distress.    Appearance: He is well-developed and normal weight. He is ill-appearing.  HENT:     Head: Normocephalic and atraumatic.  Eyes:     Conjunctiva/sclera: Conjunctivae normal.     Pupils: Pupils are equal, round, and reactive to light.  Cardiovascular:     Rate and Rhythm: Normal rate and regular rhythm.     Heart sounds: Normal heart sounds.  Pulmonary:     Effort: Pulmonary effort is normal. No respiratory distress.     Breath sounds: Normal breath sounds.  Abdominal:     General: There is no distension.     Palpations: Abdomen is soft.  Musculoskeletal:        General: Normal range of motion.     Cervical back: Normal range of motion.  Skin:    General: Skin is warm and dry.  Neurological:     Mental Status: He is alert.      UC  Treatments / Results  Labs (all labs ordered are listed, but only abnormal results are displayed) Labs Reviewed  POC COVID19/FLU A&B COMBO  POC SARS CORONAVIRUS 2 AG -  ED    EKG   Radiology No results found.  Procedures Procedures (including critical care time)  Medications Ordered in UC Medications - No data to display  Initial Impression / Assessment and Plan / UC Course  I have reviewed the triage vital signs and the nursing notes.  Pertinent labs & imaging results that were available during my care of the patient were reviewed by me and considered in my medical decision making (see chart for details).     COVID test is positive Discussed pros and cons of Paxlovid treatment.  At 89 with his comorbidities I believe he is a high risk individual and Paxlovid is indicated He had a metabolic panel performed in March of this year and his GFR was 37 Patient requests cough medicine with hydrocodone  Final Clinical Impressions(s) / UC Diagnoses   Final diagnoses:  COVID-19  Acute cough     Discharge Instructions      Take Paxlovid 2 times a day for 5 days Hold your Lipitor while on Paxlovid Drink lots of fluids May take Tylenol or ibuprofen for pain and fever I have prescribed Tussionex to take for the cough.  Caution drowsiness Make sure to wear a mask and be careful with handwashing to prevent spread Call for problems      ED Prescriptions     Medication Sig Dispense Auth. Provider   nirmatrelvir/ritonavir (PAXLOVID, 300/100,) 20 x 150 MG & 10 x 100MG  TBPK Take 3 tablets by mouth 2 (two) times daily  for 5 days. Patient GFR is 77.Take nirmatrelvir (150 mg) two tablets twice daily for 5 days and ritonavir (100 mg) one tablet twice daily for 5 days. 30 tablet Stephany Ehrich, MD   chlorpheniramine-HYDROcodone (TUSSIONEX) 10-8 MG/5ML Take 5 mLs by mouth every 12 (twelve) hours as needed for cough. 115 mL Stephany Ehrich, MD      PDMP not reviewed this  encounter.   Stephany Ehrich, MD 10/13/23 1124

## 2023-10-13 NOTE — ED Triage Notes (Signed)
 Pt states that he has a cough and nasal congestion.x3-4 days Pt states that he was exposed to covid. Pt states that he had a negative covid test 2 days ago.

## 2023-10-13 NOTE — Discharge Instructions (Signed)
 Take Paxlovid 2 times a day for 5 days Hold your Lipitor while on Paxlovid Drink lots of fluids May take Tylenol or ibuprofen for pain and fever I have prescribed Tussionex to take for the cough.  Caution drowsiness Make sure to wear a mask and be careful with handwashing to prevent spread Call for problems

## 2023-10-28 ENCOUNTER — Telehealth: Payer: Self-pay

## 2023-10-28 DIAGNOSIS — I10 Essential (primary) hypertension: Secondary | ICD-10-CM

## 2023-10-28 DIAGNOSIS — H9313 Tinnitus, bilateral: Secondary | ICD-10-CM

## 2023-10-28 NOTE — Addendum Note (Signed)
 Addended by: Jaanai Salemi D on: 10/28/2023 05:16 PM   Modules accepted: Orders

## 2023-10-28 NOTE — Telephone Encounter (Signed)
 Labs ordered  Orders Placed This Encounter  Procedures   Basic Metabolic Panel (BMET)

## 2023-10-28 NOTE — Telephone Encounter (Signed)
 Copied from CRM [id number]. Topic: Clinical - Request for Lab/Test Order >> Oct 28, 2023 12:49 PM Brynn Caras wrote: Reason for CRM: Patient states he started on a new medication - Maxide after his Telemedicine visit with Dr. Greer Leak on 04/15 for his vertigo and tinnitus. He is requesting  to have labs ordered for his kidney function to be reviewed since he has been taking the prescription consecutively for two weeks now.

## 2023-10-31 DIAGNOSIS — M542 Cervicalgia: Secondary | ICD-10-CM | POA: Diagnosis not present

## 2023-10-31 DIAGNOSIS — M9901 Segmental and somatic dysfunction of cervical region: Secondary | ICD-10-CM | POA: Diagnosis not present

## 2023-10-31 DIAGNOSIS — M9903 Segmental and somatic dysfunction of lumbar region: Secondary | ICD-10-CM | POA: Diagnosis not present

## 2023-10-31 DIAGNOSIS — M5451 Vertebrogenic low back pain: Secondary | ICD-10-CM | POA: Diagnosis not present

## 2023-10-31 NOTE — Telephone Encounter (Signed)
 Task completed. Per patient, he will try to stop by some time this week to complete the lab order.

## 2023-11-02 ENCOUNTER — Encounter: Payer: Self-pay | Admitting: Family Medicine

## 2023-11-02 LAB — BASIC METABOLIC PANEL WITH GFR
BUN/Creatinine Ratio: 16 (ref 10–24)
BUN: 16 mg/dL (ref 8–27)
CO2: 27 mmol/L (ref 20–29)
Calcium: 9.3 mg/dL (ref 8.6–10.2)
Chloride: 98 mmol/L (ref 96–106)
Creatinine, Ser: 0.98 mg/dL (ref 0.76–1.27)
Glucose: 85 mg/dL (ref 70–99)
Potassium: 4.4 mmol/L (ref 3.5–5.2)
Sodium: 137 mmol/L (ref 134–144)
eGFR: 82 mL/min/{1.73_m2} (ref >59)

## 2023-11-02 NOTE — Progress Notes (Signed)
 Your lab work is within acceptable range and there are no concerning findings.   ?

## 2023-11-04 ENCOUNTER — Ambulatory Visit (INDEPENDENT_AMBULATORY_CARE_PROVIDER_SITE_OTHER): Admitting: Family Medicine

## 2023-11-04 ENCOUNTER — Encounter: Payer: Self-pay | Admitting: Family Medicine

## 2023-11-04 ENCOUNTER — Ambulatory Visit: Payer: Self-pay

## 2023-11-04 ENCOUNTER — Other Ambulatory Visit: Payer: Self-pay | Admitting: Family Medicine

## 2023-11-04 DIAGNOSIS — I10 Essential (primary) hypertension: Secondary | ICD-10-CM

## 2023-11-04 DIAGNOSIS — R42 Dizziness and giddiness: Secondary | ICD-10-CM | POA: Insufficient documentation

## 2023-11-04 MED ORDER — LOSARTAN POTASSIUM 25 MG PO TABS: 25.0000 mg | ORAL_TABLET | Freq: Every day | ORAL | 0 refills | Status: DC

## 2023-11-04 NOTE — Assessment & Plan Note (Addendum)
 Discussed changing losartan  to 25mg  daily with Maxzide to see if feels better but BPs are still well controlled.  Maxide added for possible meniere's.  Orthostatics normal today but some drop in pressure

## 2023-11-04 NOTE — Telephone Encounter (Signed)
 This request has been handled. No further action is required. Patient has been seen by the provider this morning.

## 2023-11-04 NOTE — Patient Instructions (Signed)
 And is to decrease losartan  to 25 mg daily I sent over an updated prescription for 30 tabs to [facility] and then you will take your Maxide as well.  If you want you can even split them at part.  So for example take the Maxide in the morning and then take your losartan  in the evening.  Lets see if you feel better on that combination.  Also keep your appointment on Tuesday with vestibular rehab.

## 2023-11-04 NOTE — Assessment & Plan Note (Signed)
 Starting vestibular rehab next week. Will refer to ENT. Continue trial of maxide.

## 2023-11-04 NOTE — Progress Notes (Signed)
   Established Patient Office Visit  Subjective  Patient ID: Joshua Warner, male    DOB: [birth year]  Age: 73 y.o. MRN: [id number]  No chief complaint on file.   HPI  We had recently started Maxide for possible Mnire's.  But he still has continued to not feel great and has felt lightheaded and weak and dizzy at times.  He had been splitting his losartan  because I did not want his blood pressure dropped too low with the addition of the Maxide.  He says 1 day he took probably a quarter of the tab of losartan  and he actually felt better in part just because when he was splitting them it split on equally and so the 1 day he took a little bit less he felt better so he is wondering if just the combination of half a tab of losartan  the Maxide is maybe too much.  Just had labs on Monday while on the combination medications and potassium and renal function looks stable.  He is scheduled for vestibular rehab starting Tuesday of next week.  Has at home on the medication has been getting blood pressures between 112 and into the 120s in general with diastolics in the 70s.  He has not had any recent chest pain.  No persistent shortness of breath he says he occasionally gets short of breath with activity but it is not new.   Orthostatic VS for the past 72 hrs (Last 3 readings):  BP Location Cuff Size  11/04/23 1010 Left Arm Normal      ROS    Objective:     There were no vitals taken for this visit.   Physical Exam Vitals and nursing note reviewed.  Constitutional:      Appearance: Normal appearance.  HENT:     Head: Normocephalic and atraumatic.  Eyes:     Conjunctiva/sclera: Conjunctivae normal.  Cardiovascular:     Rate and Rhythm: Normal rate and regular rhythm.  Pulmonary:     Effort: Pulmonary effort is normal.     Breath sounds: Normal breath sounds.  Skin:    General: Skin is warm and dry.  Neurological:     Mental Status: He is alert.  Psychiatric:        Mood and  Affect: Mood normal.      No results found for any visits on 11/04/23.    The ASCVD Risk score (Arnett DK, et al., 2019) failed to calculate for the following reasons:   Risk score cannot be calculated because patient has a medical history suggesting prior/existing ASCVD    Assessment & Plan:   Problem List Items Addressed This Visit       Cardiovascular and Mediastinum   HYPERTENSION, BENIGN   Discussed changing losartan  to 25mg  daily with Maxzide to see if feels better but BPs are still well controlled.  Maxide added for possible meniere's.  Orthostatics normal today but some drop in pressure      Relevant Medications   losartan  (COZAAR ) 25 MG tablet     Other   Dizziness - Primary   Starting vestibular rehab next week. Will refer to ENT. Continue trial of maxide.       Relevant Orders   Ambulatory referral to ENT    No follow-ups on file.    Duaine German, MD

## 2023-11-04 NOTE — Telephone Encounter (Signed)
 Copied from CRM [id number]. Topic: Clinical - Red Word Triage >> Nov 04, 2023  8:37 AM Danelle Dunning F wrote: Red Word that prompted transfer to Nurse Triage:   Dizziness; Light Headed; Concerned about blood pressure being too low due to a new bp medication  112/74 - This morning    Chief Complaint: Dizziness Symptoms: dizzy, light headed Frequency: earlier this week Pertinent Negatives: Patient denies fever, chest pain, vomiting, diarrhea, bleeding, falls/injuries Disposition: [] ED /[x] Urgent Care (no appt availability in office) / [] Appointment(In office/virtual)/ []  [facility]/ [] Home Care/ [] Refused Recommended Disposition /[] [facility]/ []  Follow-up with PCP Additional Notes: Patient called and advised that he is feeling dizzy and light headed. Patient started a new blood pressure medication and was advised to take his Losartan  tablets cut in half. With cutting the Losartan  tablets in half there was one tablet that he cut uneven so patient thinks he started feeling bad when he took the larger half and that might have been too much for him.  He denies any chest pain, fever, vomiting, diarrhea, bleeding, falls, or injuries at this time.  Patient also has a history of vertigo but states he didn't think this felt like vertigo. Patient states that his blood pressure was 112/74 prior to speaking to this Triage RN. 113/78 at 8:47 am  pulse of 57. Patient states that this heart rate is around his normal heart rate. He said his blood pressures usually read in the 120s for the top number. Patient is advised that the recommendation is that he is seen by a provider in the next 24 hours. With no availability with his PCP or in the office, Urgent Care was discussed as an alternative.  Patient states that he would like a message sent to his provider Dr Greer Leak and see if she wants to just advise a change in his medication since he thinks taking a half a Losartan  and the new blood  pressure medication Maxide may be making him start to be dizzy.  Patient states his provider can message him through MyChart as well to discuss her recommendation at this time. Patient is given Care Advice as per protocol and advised that if anything gets worse to go to the Emergency Room or call 911 if needed.  Patient verbalized understanding.    Reason for Disposition  Taking a medicine that could cause dizziness (e.g., blood pressure medications, diuretics)  Answer Assessment - Initial Assessment Questions 1. DESCRIPTION: "Describe your dizziness."     Light headed 2. LIGHTHEADED: "Do you feel lightheaded?" (e.g., somewhat faint, woozy, weak upon standing)     Light headed 3. VERTIGO: "Do you feel like either you or the room is spinning or tilting?" (i.e. vertigo)     No 4. SEVERITY: "How bad is it?"  "Do you feel like you are going to faint?" "Can you stand and walk?"   - MILD: Feels slightly dizzy, but walking normally.   - MODERATE: Feels unsteady when walking, but not falling; interferes with normal activities (e.g., school, work).   - SEVERE: Unable to walk without falling, or requires assistance to walk without falling; feels like passing out now.      Patient states he walked normally and is just careful about turning too quick 5. ONSET:  "When did the dizziness begin?"     Earlier this week 6. AGGRAVATING FACTORS: "Does anything make it worse?" (e.g., standing, change in head position)     standing 7. HEART RATE: "Can you tell  me your heart rate?" "How many beats in 15 seconds?"  (Note: not all patients can do this)       57 8. CAUSE: "What do you think is causing the dizziness?"     Not sure---patient is wondering if it may have to do with blood pressure medications 9. RECURRENT SYMPTOM: "Have you had dizziness before?" If Yes, ask: "When was the last time?" "What happened that time?"     Vertigo episodes 10. OTHER SYMPTOMS: "Do you have any other symptoms?" (e.g., fever,  chest pain, vomiting, diarrhea, bleeding)       No  Protocols used: Dizziness - Lightheadedness-A-AH

## 2023-11-04 NOTE — Telephone Encounter (Signed)
 This RN called the [facility] and they advised they will reach out to patient to get him seen at the office today.

## 2023-11-08 ENCOUNTER — Encounter: Payer: Self-pay | Admitting: Physical Therapy

## 2023-11-08 ENCOUNTER — Other Ambulatory Visit: Payer: Self-pay

## 2023-11-08 ENCOUNTER — Ambulatory Visit: Attending: Family Medicine | Admitting: Physical Therapy

## 2023-11-08 DIAGNOSIS — H8112 Benign paroxysmal vertigo, left ear: Secondary | ICD-10-CM | POA: Insufficient documentation

## 2023-11-08 DIAGNOSIS — H9313 Tinnitus, bilateral: Secondary | ICD-10-CM | POA: Insufficient documentation

## 2023-11-08 DIAGNOSIS — H8110 Benign paroxysmal vertigo, unspecified ear: Secondary | ICD-10-CM | POA: Insufficient documentation

## 2023-11-08 DIAGNOSIS — R42 Dizziness and giddiness: Secondary | ICD-10-CM | POA: Diagnosis not present

## 2023-11-08 NOTE — Therapy (Signed)
 OUTPATIENT PHYSICAL THERAPY VESTIBULAR EVALUATION     Patient Name: Joshua Warner MRN: [id number] DOB:1950-10-02, 73 y.o., male Today's Date: 11/08/2023  END OF SESSION:  PT End of Session - 11/08/23 1556     Visit Number 1    Number of Visits 16    Date for PT Re-Evaluation 01/03/24    Authorization Type [employer] medicare    Authorization - Visit Number 1    Progress Note Due on Visit 10    PT Start Time 1145    PT Stop Time 1228    PT Time Calculation (min) 43 min    Activity Tolerance Patient tolerated treatment well    Behavior During Therapy WFL for tasks assessed/performed             Past Medical History:  Diagnosis Date   Arthritis    CHF (congestive heart failure) (HCC)    stents   Coronary artery disease    stents   GERD (gastroesophageal reflux disease)    Hyperlipidemia    Hypertension since 2007   Kidney stone 2022   Kidney stones 2009   Myocardial infarction (HCC) 12/2015   Oxygen deficiency    only during heartattack   Past Surgical History:  Procedure Laterality Date   CORONARY ANGIOPLASTY WITH STENT PLACEMENT  12/2015   KNEE ARTHROSCOPY  12/31/2010   Right knee    TONSILLECTOMY  1957   Patient Active Problem List   Diagnosis Date Noted   Dizziness 11/04/2023   BPPV (benign paroxysmal positional vertigo), left 02/14/2023   Nondisplaced right eighth rib fracture 06/30/2022   Left wrist injury 06/30/2022   Greater trochanteric bursitis, left 01/29/2022   Actinic keratosis 03/21/2021   Kidney stone 03/21/2021   Family history of factor V deficiency 09/15/2020   Purpura senilis (HCC) 03/17/2020   Proteins serum plasma low 03/17/2020   Venous stasis 01/28/2020   Schatzki's ring 09/10/2019   Osteoarthritis of first metatarsophalangeal (MTP) joint of right foot 05/24/2017   Primary osteoarthritis of left hand 12/10/2016   Coronary artery disease involving native coronary artery of native heart without angina pectoris 01/03/2016   Status  post insertion of drug eluting coronary artery stent 01/03/2016   STEMI (ST elevation myocardial infarction) (HCC) 01/03/2016   Complete heart block (HCC) 01/03/2016   Syncope 01/03/2016   Subscapularis tendinitis of left shoulder 10/25/2014   Hyperlipidemia 08/12/2014   Extensor intersection syndrome of both wrists 08/02/2013   Cough 08/02/2013   Allergic reaction 02/08/2013   Baker's cyst, left 01/11/2013   Solitary pulmonary nodule 11/14/2012   Arthritis of carpometacarpal joint 10/17/2012   HYPOGONADISM 06/04/2009   HYPERTENSION, BENIGN 06/04/2009   GERD 06/04/2009   ERECTILE DYSFUNCTION, ORGANIC 06/04/2009    PCP: Greer Leak REFERRING PROVIDER: Greer Leak  REFERRING DIAG: BPPV  THERAPY DIAG:  Dizziness and giddiness  ONSET DATE: April 2025  Rationale for Evaluation and Treatment: Rehabilitation  SUBJECTIVE:   SUBJECTIVE STATEMENT: Pt states that 1 year ago he had BPPV and came here for rehab. He states he recovered well after that episode. He had an episode of vertigo in February after leaning his head back to swallow pills, this caused some nausea but he was able to recover. Recently, on April 10th he was throwing trash away and had an episode of dizziness that took longer to go away and felt as if it was "pulling" him sideways. He states that he has not had a spinning feeling since April but that he does have episodes of feeling "lightheaded".  He continues to take meclizine for episodes of dizziness and zofran  for nausea. He recently started a new blood pressure medication which is thought to help with Menieres disease and has had to adjust medications to keep BP elevated. Orthostatics in [facility] office did show some orthostatic hypotension. Pt accompanied by: self  PERTINENT HISTORY: history of BPPV, wears hearing aids, tinnitus, HTN, CAD  PAIN:  Are you having pain? No  PRECAUTIONS: None  RED FLAGS: None   WEIGHT BEARING RESTRICTIONS: No  FALLS: Has patient fallen in  last 6 months? No    PLOF: Independent  PATIENT GOALS: decrease dizziness symptoms  OBJECTIVE:  Note: Objective measures were completed at Evaluation unless otherwise noted.  DIAGNOSTIC FINDINGS: MRI 2024: IMPRESSION: This MRI of the brain with and without contrast shows the following: 1.  Some scattered punctate T2/FLAIR hyperintense foci in the cerebral hemispheres consistent with minimal chronic microvascular ischemic change that is normal for age.   2.  The internal auditory canals appear normal. 3.   No acute findings.  Normal enhancement pattern    PATIENT SURVEYS:  DHI 48  VESTIBULAR ASSESSMENT:   SYMPTOM BEHAVIOR:  Subjective history: see above  Non-Vestibular symptoms: nausea/vomiting  Type of dizziness: Spinning/Vertigo and Lightheadedness/Faint  Frequency: daily  Duration: 30 seconds  Aggravating factors: Induced by position change: cervical extension, Worse outside or in busy environment, Occurs when standing still , and Moving eyes  Relieving factors: closing eyes and rest  Progression of symptoms: better  OCULOMOTOR EXAM:  Ocular Alignment: normal  Ocular ROM: No Limitations  Spontaneous Nystagmus: absent  Gaze-Induced Nystagmus: absent  Smooth Pursuits: intact  Saccades: dysmetria and slow     VESTIBULAR - OCULAR REFLEX:   Slow VOR: Normal  VOR Cancellation: Comment: mild increase in symptoms  Head-Impulse Test: HIT Right: negative HIT Left: negative  Dynamic Visual Acuity: 1 line different   POSITIONAL TESTING: Right Dix-Hallpike: no nystagmus Left Dix-Hallpike: no nystagmus Right Roll Test: no nystagmus Left Roll Test: no nystagmus  MOTION SENSITIVITY:  Motion Sensitivity Quotient Intensity: 0 = none, 1 = Lightheaded, 2 = Mild, 3 = Moderate, 4 = Severe, 5 = Vomiting  Intensity  1. Sitting to supine   2. Supine to L side   3. Supine to R side   4. Supine to sitting   5. L Hallpike-Dix   6. Up from L    7. R Hallpike-Dix   8. Up from R     9. Sitting, head tipped to L knee   10. Head up from L knee   11. Sitting, head tipped to R knee   12. Head up from R knee   13. Sitting head turns x5   14.Sitting head nods x5   15. In stance, 180 turn to L    16. In stance, 180 turn to R  TREATMENT DATE: 11/08/23   Other: VOR with cancellation plain background x 10 Pt educated on PT POC and goals, rationale for treatment  PATIENT EDUCATION: Education details: PT POC and goals, HEP Person educated: Patient Education method: Explanation, Demonstration, and Handouts Education comprehension: verbalized understanding and returned demonstration  HOME EXERCISE PROGRAM: Access Code: [email or web address] URL: [email or web address] Date: 11/08/2023 Prepared by: Lowery Rue  Exercises - Seated VOR Cancellation  - 3 x daily - 7 x weekly - 3 sets - 10 reps  GOALS: Goals reviewed with patient? Yes  SHORT TERM GOALS: Target date: 12/06/2023    Pt will be independent with initial HEP Baseline: Goal status: INITIAL  2.  Pt will tolerate watching sports on TV x 10 minutes without increase in symptoms Baseline:1-2 minutes  Goal status: INITIAL    LONG TERM GOALS: Target date: 01/03/2024    Pt will be independent with HEP to self manage symptoms Baseline:  Goal status: INITIAL  2.  Pt will improve DHI to <= 30 to demo improved functional mobility Baseline: 48 Goal status: INITIAL  3.  Pt will tolerate reaching to upper shelf with cervical extension without increase in symptoms Baseline:  Goal status: INITIAL  4.  Pt will tolerate VOR with cancellation in busy environment without increase in symptoms Baseline:  Goal status: INITIAL    ASSESSMENT:  CLINICAL IMPRESSION: Patient is a 73 y.o. male who was seen today for physical therapy evaluation and treatment for dizziness. Pt  presents with deficits in oculomotor exam as well as increased symptoms with VOR with cancellation. He does report some increased symptoms with cervical extension. He is negative during positional testing this visit but did take medication prior to evaluation which may be masking some symptoms. Pt will benefit from skilled PT to address deficits to reduce episodes of dizziness and reduce fall risk to progress towards more functional safety and independence.   OBJECTIVE IMPAIRMENTS: decreased activity tolerance, decreased balance, and dizziness.   ACTIVITY LIMITATIONS: reach over head and locomotion level  PARTICIPATION LIMITATIONS: cleaning, driving, community activity, and yard work  PERSONAL FACTORS: Past/current experiences and 1-2 comorbidities: tinnitus, hypertension are also affecting patient's functional outcome.   REHAB POTENTIAL: Good  CLINICAL DECISION MAKING: Evolving/moderate complexity  EVALUATION COMPLEXITY: Moderate   PLAN:  PT FREQUENCY: 1-2x/week  PT DURATION: 8 weeks  PLANNED INTERVENTIONS: 97164- PT Re-evaluation, 97110-Therapeutic exercises, 97530- Therapeutic activity, 97112- Neuromuscular re-education, 97535- Self Care, 78469- Manual therapy, Balance training, Vestibular training, and Visual/preceptual remediation/compensation  PLAN FOR NEXT SESSION: do CTSIB, pt advised to not take medication prior to next visit so may want to retest   Danyel Tobey, PT 11/08/2023, 3:57 PM  For all possible CPT codes, reference the Planned Interventions line above.     Check all conditions that are expected to impact treatment: {Conditions expected to impact treatment:None of these apply   If treatment provided at initial evaluation, no treatment charged due to lack of authorization.

## 2023-11-09 ENCOUNTER — Encounter: Payer: Self-pay | Admitting: Family Medicine

## 2023-11-09 DIAGNOSIS — H8110 Benign paroxysmal vertigo, unspecified ear: Secondary | ICD-10-CM

## 2023-11-09 DIAGNOSIS — H9313 Tinnitus, bilateral: Secondary | ICD-10-CM

## 2023-11-15 NOTE — Therapy (Signed)
 OUTPATIENT PHYSICAL THERAPY VESTIBULAR TREATMENT     Patient Name: Joshua Warner MRN: [id number] DOB:1950/11/01, 73 y.o., male Today's Date: 11/16/2023  END OF SESSION:  PT End of Session - 11/16/23 1153     Visit Number 2    Number of Visits 16    Date for PT Re-Evaluation 01/03/24    Authorization Type [employer] medicare    Progress Note Due on Visit 10    PT Start Time 1153    PT Stop Time 1231    PT Time Calculation (min) 38 min    Activity Tolerance Patient tolerated treatment well    Behavior During Therapy WFL for tasks assessed/performed            Past Medical History:  Diagnosis Date   Arthritis    CHF (congestive heart failure) (HCC)    stents   Coronary artery disease    stents   GERD (gastroesophageal reflux disease)    Hyperlipidemia    Hypertension since 2007   Kidney stone 2022   Kidney stones 2009   Myocardial infarction (HCC) 12/2015   Oxygen deficiency    only during heartattack   Past Surgical History:  Procedure Laterality Date   CORONARY ANGIOPLASTY WITH STENT PLACEMENT  12/2015   KNEE ARTHROSCOPY  12/31/2010   Right knee    TONSILLECTOMY  1957   Patient Active Problem List   Diagnosis Date Noted   Dizziness 11/04/2023   BPPV (benign paroxysmal positional vertigo), left 02/14/2023   Nondisplaced right eighth rib fracture 06/30/2022   Left wrist injury 06/30/2022   Greater trochanteric bursitis, left 01/29/2022   Actinic keratosis 03/21/2021   Kidney stone 03/21/2021   Family history of factor V deficiency 09/15/2020   Purpura senilis (HCC) 03/17/2020   Proteins serum plasma low 03/17/2020   Venous stasis 01/28/2020   Schatzki's ring 09/10/2019   Osteoarthritis of first metatarsophalangeal (MTP) joint of right foot 05/24/2017   Primary osteoarthritis of left hand 12/10/2016   Coronary artery disease involving native coronary artery of native heart without angina pectoris 01/03/2016   Status post insertion of drug eluting coronary  artery stent 01/03/2016   STEMI (ST elevation myocardial infarction) (HCC) 01/03/2016   Complete heart block (HCC) 01/03/2016   Syncope 01/03/2016   Subscapularis tendinitis of left shoulder 10/25/2014   Hyperlipidemia 08/12/2014   Extensor intersection syndrome of both wrists 08/02/2013   Cough 08/02/2013   Allergic reaction 02/08/2013   Baker's cyst, left 01/11/2013   Solitary pulmonary nodule 11/14/2012   Arthritis of carpometacarpal joint 10/17/2012   HYPOGONADISM 06/04/2009   HYPERTENSION, BENIGN 06/04/2009   GERD 06/04/2009   ERECTILE DYSFUNCTION, ORGANIC 06/04/2009    PCP: Greer Leak REFERRING PROVIDER: Greer Leak  REFERRING DIAG: BPPV  THERAPY DIAG:  Dizziness and giddiness  BPPV (benign paroxysmal positional vertigo), left  ONSET DATE: April 2025  Rationale for Evaluation and Treatment: Rehabilitation  SUBJECTIVE:   SUBJECTIVE STATEMENT: The patient reports he got an appt with ENT for July 22 as earliest visit. He has not had any further severe spells since 4/10. The one HEP didn't provoke any symptoms. Symptoms are worse in the mornings and he describes an unsteadiness. He does have good days and bad days (had good days Sat/Sun, but Monday was unsteady, Tuesday good). He has been taking meclizine daily.   EVAL:Pt states that 1 year ago he had BPPV and came here for rehab. He states he recovered well after that episode. He had an episode of vertigo in February after leaning  his head back to swallow pills, this caused some nausea but he was able to recover. Recently, on April 10th he was throwing trash away and had an episode of dizziness that took longer to go away and felt as if it was "pulling" him sideways. He states that he has not had a spinning feeling since April but that he does have episodes of feeling "lightheaded".  He continues to take meclizine for episodes of dizziness and zofran  for nausea. He recently started a new blood pressure medication which is thought  to help with Menieres disease and has had to adjust medications to keep BP elevated. Orthostatics in [facility] office did show some orthostatic hypotension. Pt accompanied by: self  PERTINENT HISTORY: history of BPPV, wears hearing aids, tinnitus, HTN, CAD  PAIN:  Are you having pain? No  PRECAUTIONS: None  WEIGHT BEARING RESTRICTIONS: No  FALLS: Has patient fallen in last 6 months? No  PLOF: Independent  PATIENT GOALS: decrease dizziness symptoms  OBJECTIVE:  Note: Objective measures were completed at Evaluation unless otherwise noted.  DIAGNOSTIC FINDINGS: MRI 2024: IMPRESSION: This MRI of the brain with and without contrast shows the following: 1.  Some scattered punctate T2/FLAIR hyperintense foci in the cerebral hemispheres consistent with minimal chronic microvascular ischemic change that is normal for age.   2.  The internal auditory canals appear normal. 3.   No acute findings.  Normal enhancement pattern  PATIENT SURVEYS:  DHI 48  VESTIBULAR ASSESSMENT: SYMPTOM BEHAVIOR:  Subjective history: see above  Non-Vestibular symptoms: nausea/vomiting  Type of dizziness: Spinning/Vertigo and Lightheadedness/Faint  Frequency: daily  Duration: 30 seconds  Aggravating factors: Induced by position change: cervical extension, Worse outside or in busy environment, Occurs when standing still , and Moving eyes  Relieving factors: closing eyes and rest  Progression of symptoms: better  OCULOMOTOR EXAM:  Ocular Alignment: normal  Ocular ROM: No Limitations  Spontaneous Nystagmus: absent  Gaze-Induced Nystagmus: absent  Smooth Pursuits: intact  Saccades: dysmetria and slow   VESTIBULAR - OCULAR REFLEX:   Slow VOR: Normal  VOR Cancellation: Comment: mild increase in symptoms  Head-Impulse Test: HIT Right: negative HIT Left: negative  Dynamic Visual Acuity: 1 line different   POSITIONAL TESTING: Right Dix-Hallpike: no nystagmus Left Dix-Hallpike: no nystagmus Right Roll Test: no  nystagmus Left Roll Test: no nystagmus  MOTION SENSITIVITY:  Motion Sensitivity Quotient Intensity: 0 = none, 1 = Lightheaded, 2 = Mild, 3 = Moderate, 4 = Severe, 5 = Vomiting  Intensity  1. Sitting to supine   2. Supine to L side   3. Supine to R side   4. Supine to sitting   5. L Hallpike-Dix   6. Up from L    7. R Hallpike-Dix   8. Up from R    9. Sitting, head tipped to L knee   10. Head up from L knee   11. Sitting, head tipped to R knee   12. Head up from R knee   13. Sitting head turns x5   14.Sitting head nods x5   15. In stance, 180 turn to L    16. In stance, 180 turn to R        [facility] Adult PT Treatment:  DATE: 11/16/23 Neuromuscular re-ed: Bilateral sidelying test negative for dizziness Bilateral horizontal roll test: negative for dizziness VOR cancellation standing without symptoms today VOR x 1 viewing horizontal 30 seconds 2 reps Standing feet apart VOR x 1 viewing vertical 30 seconds 2 reps Standing feet apart Corner balance with eyes closed x 20 seconds x 2 reps Single limb stance                                                                                                                       PATIENT EDUCATION: Education details: HEP Person educated: Patient Education method: Programmer, multimedia, Facilities manager, and Handouts Education comprehension: verbalized understanding and returned demonstration  HOME EXERCISE PROGRAM: Access Code: [email or web address] URL: [email or web address] Date: 11/16/2023 Prepared by: Trygve Gage  Exercises - Standing Gaze Stabilization with Head Rotation  - 2 x daily - 7 x weekly - 1 sets - 2 reps - 30 seconds hold - Standing Gaze Stabilization with Head Nod  - 2 x daily - 7 x weekly - 1 sets - 2 reps - 30 seconds hold - Romberg Stance with Eyes Closed  - 1 x daily - 5 x weekly - 1 sets - 3 reps - 30 seconds hold - Single Leg Stance with Support  - 1 x daily - 5 x  weekly - 1 sets - 3 reps - 15 seconds hold  GOALS: Goals reviewed with patient? Yes  SHORT TERM GOALS: Target date: 12/06/2023  Pt will be independent with initial HEP Baseline: Goal status: INITIAL  2.  Pt will tolerate watching sports on TV x 10 minutes without increase in symptoms Baseline:1-2 minutes  Goal status: MET-- notes he can do this now  LONG TERM GOALS: Target date: 01/03/2024  Pt will be independent with HEP to self manage symptoms Baseline:  Goal status: INITIAL  2.  Pt will improve DHI to <= 30 to demo improved functional mobility Baseline: 48 Goal status: INITIAL  3.  Pt will tolerate reaching to upper shelf with cervical extension without increase in symptoms Baseline:  Goal status: INITIAL  4.  Pt will tolerate VOR with cancellation in busy environment without increase in symptoms Baseline:  Goal status:MET   ASSESSMENT: CLINICAL IMPRESSION: The patient is relying on meclizine daily (over the counter) and notes continued unsteadiness. PT provided HEP to improve reliance on vestibular inputs and also recommended slowly coming off of meclizine as long as it was prescribed PRN. Plan to check in 10 days to give time for HEP.  EVAL: Patient is a 73 y.o. male who was seen today for physical therapy evaluation and treatment for dizziness. Pt presents with deficits in oculomotor exam as well as increased symptoms with VOR with cancellation. He does report some increased symptoms with cervical extension. He is negative during positional testing this visit but did take medication prior to evaluation which may be masking some symptoms. Pt will benefit from skilled PT to address deficits to reduce episodes of dizziness and reduce fall risk to progress  towards more functional safety and independence.   OBJECTIVE IMPAIRMENTS: decreased activity tolerance, decreased balance, and dizziness.   PLAN:  PT FREQUENCY: 1-2x/week  PT DURATION: 8 weeks  PLANNED INTERVENTIONS:  97164- PT Re-evaluation, 97110-Therapeutic exercises, 97530- Therapeutic activity, 97112- Neuromuscular re-education, 97535- Self Care, 16109- Manual therapy, Balance training, Vestibular training, and Visual/preceptual remediation/compensation  PLAN FOR NEXT SESSION: do CTSIB *had difficulty with eyes closed on solid surface,  retest without meclizine if able.    Jamiyah Dingley, PT 11/16/2023, 11:56 AM

## 2023-11-16 ENCOUNTER — Ambulatory Visit: Admitting: Rehabilitative and Restorative Service Providers"

## 2023-11-16 ENCOUNTER — Encounter: Payer: Self-pay | Admitting: Rehabilitative and Restorative Service Providers"

## 2023-11-16 DIAGNOSIS — H8112 Benign paroxysmal vertigo, left ear: Secondary | ICD-10-CM

## 2023-11-16 DIAGNOSIS — R42 Dizziness and giddiness: Secondary | ICD-10-CM | POA: Diagnosis not present

## 2023-11-16 DIAGNOSIS — H8110 Benign paroxysmal vertigo, unspecified ear: Secondary | ICD-10-CM | POA: Diagnosis not present

## 2023-11-16 DIAGNOSIS — H9313 Tinnitus, bilateral: Secondary | ICD-10-CM | POA: Diagnosis not present

## 2023-11-23 DIAGNOSIS — M5451 Vertebrogenic low back pain: Secondary | ICD-10-CM | POA: Diagnosis not present

## 2023-11-23 DIAGNOSIS — M9901 Segmental and somatic dysfunction of cervical region: Secondary | ICD-10-CM | POA: Diagnosis not present

## 2023-11-23 DIAGNOSIS — M9903 Segmental and somatic dysfunction of lumbar region: Secondary | ICD-10-CM | POA: Diagnosis not present

## 2023-11-23 DIAGNOSIS — M542 Cervicalgia: Secondary | ICD-10-CM | POA: Diagnosis not present

## 2023-11-26 ENCOUNTER — Other Ambulatory Visit: Payer: Self-pay | Admitting: Family Medicine

## 2023-11-26 DIAGNOSIS — I10 Essential (primary) hypertension: Secondary | ICD-10-CM

## 2023-11-28 ENCOUNTER — Encounter: Payer: Self-pay | Admitting: Rehabilitative and Restorative Service Providers"

## 2023-11-28 ENCOUNTER — Ambulatory Visit: Attending: Family Medicine | Admitting: Rehabilitative and Restorative Service Providers"

## 2023-11-28 DIAGNOSIS — R42 Dizziness and giddiness: Secondary | ICD-10-CM | POA: Diagnosis not present

## 2023-11-28 DIAGNOSIS — H8112 Benign paroxysmal vertigo, left ear: Secondary | ICD-10-CM | POA: Insufficient documentation

## 2023-11-28 NOTE — Therapy (Signed)
 OUTPATIENT PHYSICAL THERAPY VESTIBULAR TREATMENT   Patient Name: Joshua Warner MRN: [id number] DOB:May 01, 1951, 73 y.o., male Today's Date: 11/28/2023  END OF SESSION:  PT End of Session - 11/28/23 1107     Visit Number 3    Number of Visits 16    Date for PT Re-Evaluation 01/03/24    Authorization Type [employer] medicare    Progress Note Due on Visit 10    PT Start Time 1105    PT Stop Time 1145    PT Time Calculation (min) 40 min    Activity Tolerance Patient tolerated treatment well    Behavior During Therapy WFL for tasks assessed/performed            Past Medical History:  Diagnosis Date   Arthritis    CHF (congestive heart failure) (HCC)    stents   Coronary artery disease    stents   GERD (gastroesophageal reflux disease)    Hyperlipidemia    Hypertension since 2007   Kidney stone 2022   Kidney stones 2009   Myocardial infarction (HCC) 12/2015   Oxygen deficiency    only during heartattack   Past Surgical History:  Procedure Laterality Date   CORONARY ANGIOPLASTY WITH STENT PLACEMENT  12/2015   KNEE ARTHROSCOPY  12/31/2010   Right knee    TONSILLECTOMY  1957   Patient Active Problem List   Diagnosis Date Noted   Dizziness 11/04/2023   BPPV (benign paroxysmal positional vertigo), left 02/14/2023   Nondisplaced right eighth rib fracture 06/30/2022   Left wrist injury 06/30/2022   Greater trochanteric bursitis, left 01/29/2022   Actinic keratosis 03/21/2021   Kidney stone 03/21/2021   Family history of factor V deficiency 09/15/2020   Purpura senilis (HCC) 03/17/2020   Proteins serum plasma low 03/17/2020   Venous stasis 01/28/2020   Schatzki's ring 09/10/2019   Osteoarthritis of first metatarsophalangeal (MTP) joint of right foot 05/24/2017   Primary osteoarthritis of left hand 12/10/2016   Coronary artery disease involving native coronary artery of native heart without angina pectoris 01/03/2016   Status post insertion of drug eluting coronary  artery stent 01/03/2016   STEMI (ST elevation myocardial infarction) (HCC) 01/03/2016   Complete heart block (HCC) 01/03/2016   Syncope 01/03/2016   Subscapularis tendinitis of left shoulder 10/25/2014   Hyperlipidemia 08/12/2014   Extensor intersection syndrome of both wrists 08/02/2013   Cough 08/02/2013   Allergic reaction 02/08/2013   Baker's cyst, left 01/11/2013   Solitary pulmonary nodule 11/14/2012   Arthritis of carpometacarpal joint 10/17/2012   HYPOGONADISM 06/04/2009   HYPERTENSION, BENIGN 06/04/2009   GERD 06/04/2009   ERECTILE DYSFUNCTION, ORGANIC 06/04/2009    PCP: Greer Leak REFERRING PROVIDER: Greer Leak  REFERRING DIAG: BPPV  THERAPY DIAG:  Dizziness and giddiness  BPPV (benign paroxysmal positional vertigo), left  ONSET DATE: April 2025  Rationale for Evaluation and Treatment: Rehabilitation  SUBJECTIVE:   SUBJECTIVE STATEMENT: The patient has tried to go without meclizine 2 days over the past week. He is still getting occasional dizziness with head/body turns.  Gaze exercises provoke dizziness.   EVAL:Pt states that 1 year ago he had BPPV and came here for rehab. He states he recovered well after that episode. He had an episode of vertigo in February after leaning his head back to swallow pills, this caused some nausea but he was able to recover. Recently, on April 10th he was throwing trash away and had an episode of dizziness that took longer to go away and felt as if  it was "pulling" him sideways. He states that he has not had a spinning feeling since April but that he does have episodes of feeling "lightheaded".  He continues to take meclizine for episodes of dizziness and zofran  for nausea. He recently started a new blood pressure medication which is thought to help with Menieres disease and has had to adjust medications to keep BP elevated. Orthostatics in [place] office did show some orthostatic hypotension. Pt accompanied by: self  PERTINENT HISTORY: history  of BPPV, wears hearing aids, tinnitus, HTN, CAD  PAIN:  Are you having pain? No  PRECAUTIONS: None  WEIGHT BEARING RESTRICTIONS: No  FALLS: Has patient fallen in last 6 months? No  PLOF: Independent  PATIENT GOALS: decrease dizziness symptoms  OBJECTIVE:  Note: Objective measures were completed at Evaluation unless otherwise noted.  DIAGNOSTIC FINDINGS: MRI 2024: IMPRESSION: This MRI of the brain with and without contrast shows the following: 1.  Some scattered punctate T2/FLAIR hyperintense foci in the cerebral hemispheres consistent with minimal chronic microvascular ischemic change that is normal for age.   2.  The internal auditory canals appear normal. 3.   No acute findings.  Normal enhancement pattern  PATIENT SURVEYS:  DHI 48  VESTIBULAR ASSESSMENT: SYMPTOM BEHAVIOR:  Subjective history: see above  Non-Vestibular symptoms: nausea/vomiting  Type of dizziness: Spinning/Vertigo and Lightheadedness/Faint  Frequency: daily  Duration: 30 seconds  Aggravating factors: Induced by position change: cervical extension, Worse outside or in busy environment, Occurs when standing still , and Moving eyes  Relieving factors: closing eyes and rest  Progression of symptoms: better  OCULOMOTOR EXAM:  Ocular Alignment: normal  Ocular ROM: No Limitations  Spontaneous Nystagmus: absent  Gaze-Induced Nystagmus: absent  Smooth Pursuits: intact  Saccades: dysmetria and slow   VESTIBULAR - OCULAR REFLEX:   Slow VOR: Normal  VOR Cancellation: Comment: mild increase in symptoms  Head-Impulse Test: HIT Right: negative HIT Left: negative  Dynamic Visual Acuity: 1 line different   POSITIONAL TESTING: Right Dix-Hallpike: no nystagmus Left Dix-Hallpike: no nystagmus Right Roll Test: no nystagmus Left Roll Test: no nystagmus  MOTION SENSITIVITY:  Motion Sensitivity Quotient Intensity: 0 = none, 1 = Lightheaded, 2 = Mild, 3 = Moderate, 4 = Severe, 5 = Vomiting  Intensity  1. Sitting  to supine   2. Supine to L side   3. Supine to R side   4. Supine to sitting   5. L Hallpike-Dix   6. Up from L    7. R Hallpike-Dix   8. Up from R    9. Sitting, head tipped to L knee   10. Head up from L knee   11. Sitting, head tipped to R knee   12. Head up from R knee   13. Sitting head turns x5   14.Sitting head nods x5   15. In stance, 180 turn to L    16. In stance, 180 turn to R      [facility] Adult PT Treatment:                                                DATE: 11/28/23 Therapeutic Exercise: Neck stretching upper trap, AROM rotation and sidebending Neuromuscular re-ed: CTSIB: 30 seconds solid surface eyes open, 30 seconds solid surface eyes closed; 30 seconds foam with eyes open, 10 seconds foam with eyes closed Gaze X 1 viewing  standing horizontal x 30 seconds x 2 reps X 1 viewing standing vertical x 30 seconds x 1 rep Increased speed with horizontal motion with cues Habituation Rolling x 3 reps  Sit<>sideying x 1 rep Balance Single leg standing Foam standing with eyes closed with SBA Tandem gait x 15 ft x 3 reps Hitting targets R and L sides to encourage more head/neck motion Gait: Dynamic gait activities with ball toss and horizontal ball pass   [facility] Adult PT Treatment:                                                DATE: 11/16/23 Neuromuscular re-ed: Bilateral sidelying test negative for dizziness Bilateral horizontal roll test: negative for dizziness VOR cancellation standing without symptoms today VOR x 1 viewing horizontal 30 seconds 2 reps Standing feet apart VOR x 1 viewing vertical 30 seconds 2 reps Standing feet apart Corner balance with eyes closed x 20 seconds x 2 reps Single limb stance                                                                                                                       PATIENT EDUCATION: Education details: HEP Person educated: Patient Education method: Programmer, multimedia, Facilities manager, and Handouts Education  comprehension: verbalized understanding and returned demonstration  HOME EXERCISE PROGRAM: Access Code: [email or web address] URL: [email or web address] Date: 11/28/2023 Prepared by: Trygve Gage  Exercises - Standing Gaze Stabilization with Head Rotation  - 2 x daily - 7 x weekly - 1 sets - 2 reps - 30 seconds hold - Standing Gaze Stabilization with Head Nod  - 2 x daily - 7 x weekly - 1 sets - 2 reps - 30 seconds hold - Romberg Stance with Eyes Closed  - 1 x daily - 5 x weekly - 1 sets - 3 reps - 30 seconds hold - Wide Stance with Eyes Closed and Head Rotation on Foam Pad  - 1 x daily - 5 x weekly - 1 sets - 3 reps - 10-15 seconds hold - Single Leg Stance with Support  - 1 x daily - 5 x weekly - 1 sets - 3 reps - 15 seconds hold  GOALS: Goals reviewed with patient? Yes  SHORT TERM GOALS: Target date: 12/06/2023  Pt will be independent with initial HEP Baseline: Goal status: MET  2.  Pt will tolerate watching sports on TV x 10 minutes without increase in symptoms Baseline:1-2 minutes  Goal status: MET-- notes he can do this now  LONG TERM GOALS: Target date: 01/03/2024  Pt will be independent with HEP to self manage symptoms Baseline:  Goal status: INITIAL  2.  Pt will improve DHI to <= 30 to demo improved functional mobility Baseline: 48 Goal status: INITIAL  3.  Pt will tolerate reaching to upper shelf with cervical extension without increase in symptoms Baseline:  Goal status:  INITIAL  4.  Pt will tolerate VOR with cancellation in busy environment without increase in symptoms Baseline:  Goal status:MET   ASSESSMENT: CLINICAL IMPRESSION: The patient has been without meclizine x a couple of days int he last week. He is limited in neck ROM and appears guarded with dynamic gait activities. PT to continue to progress HEP-- plan to check in with patient in 10 days and will re-assess (hold until MD appt?) based on symptoms.  EVAL: Patient is a 73 y.o. male who was  seen today for physical therapy evaluation and treatment for dizziness. Pt presents with deficits in oculomotor exam as well as increased symptoms with VOR with cancellation. He does report some increased symptoms with cervical extension. He is negative during positional testing this visit but did take medication prior to evaluation which may be masking some symptoms. Pt will benefit from skilled PT to address deficits to reduce episodes of dizziness and reduce fall risk to progress towards more functional safety and independence.   OBJECTIVE IMPAIRMENTS: decreased activity tolerance, decreased balance, and dizziness.   PLAN:  PT FREQUENCY: 1-2x/week  PT DURATION: 8 weeks  PLANNED INTERVENTIONS: 97164- PT Re-evaluation, 97110-Therapeutic exercises, 97530- Therapeutic activity, 97112- Neuromuscular re-education, 97535- Self Care, 16109- Manual therapy, Balance training, Vestibular training, and Visual/preceptual remediation/compensation  PLAN FOR NEXT SESSION: Progress HEP, gaze progression, multi-sensory balance, habituation, neck mobility.   Arianah Torgeson, PT 11/28/2023, 11:07 AM

## 2023-12-08 ENCOUNTER — Other Ambulatory Visit: Payer: Self-pay | Admitting: Family Medicine

## 2023-12-08 MED ORDER — TRIAMTERENE-HCTZ 37.5-25 MG PO TABS: 1.0000 | ORAL_TABLET | Freq: Every day | ORAL | 1 refills | Status: DC

## 2023-12-08 NOTE — Telephone Encounter (Signed)
 Requesting rx rf of Maxide 37.5-25mg  Last written 10/11/2023 Last OV 11/04/2023 Upcoming appt 03/01/2024 Would you like me to cancel the Losartan  ?

## 2023-12-08 NOTE — Addendum Note (Signed)
 Addended by: Dickie Found on: 12/08/2023 08:45 AM   Modules accepted: Orders

## 2023-12-16 ENCOUNTER — Ambulatory Visit: Admitting: Rehabilitative and Restorative Service Providers"

## 2023-12-16 ENCOUNTER — Encounter: Payer: Self-pay | Admitting: Rehabilitative and Restorative Service Providers"

## 2023-12-16 DIAGNOSIS — H8112 Benign paroxysmal vertigo, left ear: Secondary | ICD-10-CM

## 2023-12-16 DIAGNOSIS — R42 Dizziness and giddiness: Secondary | ICD-10-CM

## 2023-12-16 NOTE — Therapy (Signed)
 OUTPATIENT PHYSICAL THERAPY VESTIBULAR TREATMENT and DISCHARGE SUMMARY   Patient Name: Colon Rueth MRN: [id number] DOB:1951-02-01, 73 y.o., male Today's Date: 12/16/2023  END OF SESSION:  PT End of Session - 12/16/23 0849     Visit Number 4    Number of Visits 16    Date for PT Re-Evaluation 01/03/24    Authorization Type [employer] medicare    Progress Note Due on Visit 10    PT Start Time 0850    PT Stop Time 0928    PT Time Calculation (min) 38 min    Activity Tolerance Patient tolerated treatment well    Behavior During Therapy [facility] for tasks assessed/performed         Past Medical History:  Diagnosis Date   Arthritis    CHF (congestive heart failure) (HCC)    stents   Coronary artery disease    stents   GERD (gastroesophageal reflux disease)    Hyperlipidemia    Hypertension since 2007   Kidney stone 2022   Kidney stones 2009   Myocardial infarction (HCC) 12/2015   Oxygen deficiency    only during heartattack   Past Surgical History:  Procedure Laterality Date   CORONARY ANGIOPLASTY WITH STENT PLACEMENT  12/2015   KNEE ARTHROSCOPY  12/31/2010   Right knee    TONSILLECTOMY  1957   Patient Active Problem List   Diagnosis Date Noted   Dizziness 11/04/2023   BPPV (benign paroxysmal positional vertigo), left 02/14/2023   Nondisplaced right eighth rib fracture 06/30/2022   Left wrist injury 06/30/2022   Greater trochanteric bursitis, left 01/29/2022   Actinic keratosis 03/21/2021   Kidney stone 03/21/2021   Family history of factor V deficiency 09/15/2020   Purpura senilis (HCC) 03/17/2020   Proteins serum plasma low 03/17/2020   Venous stasis 01/28/2020   Schatzki's ring 09/10/2019   Osteoarthritis of first metatarsophalangeal (MTP) joint of right foot 05/24/2017   Primary osteoarthritis of left hand 12/10/2016   Coronary artery disease involving native coronary artery of native heart without angina pectoris 01/03/2016   Status post insertion of drug  eluting coronary artery stent 01/03/2016   STEMI (ST elevation myocardial infarction) (HCC) 01/03/2016   Complete heart block (HCC) 01/03/2016   Syncope 01/03/2016   Subscapularis tendinitis of left shoulder 10/25/2014   Hyperlipidemia 08/12/2014   Extensor intersection syndrome of both wrists 08/02/2013   Cough 08/02/2013   Allergic reaction 02/08/2013   Baker's cyst, left 01/11/2013   Solitary pulmonary nodule 11/14/2012   Arthritis of carpometacarpal joint 10/17/2012   HYPOGONADISM 06/04/2009   HYPERTENSION, BENIGN 06/04/2009   GERD 06/04/2009   ERECTILE DYSFUNCTION, ORGANIC 06/04/2009    PCP: Greer Leak REFERRING PROVIDER: Metheney  REFERRING DIAG: BPPV  THERAPY DIAG:  No diagnosis found.  ONSET DATE: April 2025  Rationale for Evaluation and Treatment: Rehabilitation  SUBJECTIVE:   SUBJECTIVE STATEMENT: The patient reports that he didn't do the exercises for a couple of days and returned to them yesterday. They provoked mild dizziness. He does try to go a couple of days without meclizine, but still feels some symptoms when without meds.   EVAL:Pt states that 1 year ago he had BPPV and came here for rehab. He states he recovered well after that episode. He had an episode of vertigo in February after leaning his head back to swallow pills, this caused some nausea but he was able to recover. Recently, on April 10th he was throwing trash away and had an episode of dizziness that took longer  to go away and felt as if it was pulling him sideways. He states that he has not had a spinning feeling since April but that he does have episodes of feeling lightheaded.  He continues to take meclizine for episodes of dizziness and zofran  for nausea. He recently started a new blood pressure medication which is thought to help with Menieres disease and has had to adjust medications to keep BP elevated. Orthostatics in [facility] office did show some orthostatic hypotension. Pt accompanied by:  self  PERTINENT HISTORY: history of BPPV, wears hearing aids, tinnitus, HTN, CAD  PAIN:  Are you having pain? No  PRECAUTIONS: None  WEIGHT BEARING RESTRICTIONS: No  FALLS: Has patient fallen in last 6 months? No  PLOF: Independent  PATIENT GOALS: decrease dizziness symptoms  OBJECTIVE:  Note: Objective measures were completed at Evaluation unless otherwise noted.  DIAGNOSTIC FINDINGS: MRI 2024: IMPRESSION: This MRI of the brain with and without contrast shows the following: 1.  Some scattered punctate T2/FLAIR hyperintense foci in the cerebral hemispheres consistent with minimal chronic microvascular ischemic change that is normal for age.   2.  The internal auditory canals appear normal. 3.   No acute findings.  Normal enhancement pattern  PATIENT SURVEYS:  DHI 48  VESTIBULAR ASSESSMENT: SYMPTOM BEHAVIOR:  Subjective history: see above  Non-Vestibular symptoms: nausea/vomiting  Type of dizziness: Spinning/Vertigo and Lightheadedness/Faint  Frequency: daily  Duration: 30 seconds  Aggravating factors: Induced by position change: cervical extension, Worse outside or in busy environment, Occurs when standing still , and Moving eyes  Relieving factors: closing eyes and rest  Progression of symptoms: better  OCULOMOTOR EXAM:  Ocular Alignment: normal  Ocular ROM: No Limitations  Spontaneous Nystagmus: absent  Gaze-Induced Nystagmus: absent  Smooth Pursuits: intact  Saccades: dysmetria and slow   VESTIBULAR - OCULAR REFLEX:   Slow VOR: Normal  VOR Cancellation: Comment: mild increase in symptoms  Head-Impulse Test: HIT Right: negative HIT Left: negative  Dynamic Visual Acuity: 1 line different   POSITIONAL TESTING: Right Dix-Hallpike: no nystagmus Left Dix-Hallpike: no nystagmus Right Roll Test: no nystagmus Left Roll Test: no nystagmus  MOTION SENSITIVITY:  Motion Sensitivity Quotient Intensity: 0 = none, 1 = Lightheaded, 2 = Mild, 3 = Moderate, 4 = Severe, 5  = Vomiting  Intensity  1. Sitting to supine   2. Supine to L side   3. Supine to R side   4. Supine to sitting   5. L Hallpike-Dix   6. Up from L    7. R Hallpike-Dix   8. Up from R    9. Sitting, head tipped to L knee   10. Head up from L knee   11. Sitting, head tipped to R knee   12. Head up from R knee   13. Sitting head turns x5   14.Sitting head nods x5   15. In stance, 180 turn to L    16. In stance, 180 turn to R      [facility] Adult PT Treatment:                                                DATE: 12/16/23 Therapeutic Exercise: Seated Upper trap stretch with cues Neck AROM R and L Shoulder shrugs Shoulder circles Standing W band x 10 reps ER band  x 10 reps Neuromuscular re-ed: Gaze adaptation x  1 viewing Horizontal plane x 30 seconds in standing Vertical plane x 30 seconds in standing Balance Single leg standing Romberg with feet together + eyes closed Pillow standing with eyes closed DHI=20%  [facility] Adult PT Treatment:                                                DATE: 11/28/23 Therapeutic Exercise: Neck stretching upper trap, AROM rotation and sidebending Neuromuscular re-ed: CTSIB: 30 seconds solid surface eyes open, 30 seconds solid surface eyes closed; 30 seconds foam with eyes open, 10 seconds foam with eyes closed Gaze X 1 viewing standing horizontal x 30 seconds x 2 reps X 1 viewing standing vertical x 30 seconds x 1 rep Increased speed with horizontal motion with cues Habituation Rolling x 3 reps  Sit<>sideying x 1 rep Balance Single leg standing Foam standing with eyes closed with SBA Tandem gait x 15 ft x 3 reps Hitting targets R and L sides to encourage more head/neck motion Gait: Dynamic gait activities with ball toss and horizontal ball pass   [facility] Adult PT Treatment:                                                DATE: 11/16/23 Neuromuscular re-ed: Bilateral sidelying test negative for dizziness Bilateral horizontal roll test:  negative for dizziness VOR cancellation standing without symptoms today VOR x 1 viewing horizontal 30 seconds 2 reps Standing feet apart VOR x 1 viewing vertical 30 seconds 2 reps Standing feet apart Corner balance with eyes closed x 20 seconds x 2 reps Single limb stance                                                                                                                       PATIENT EDUCATION: Education details: HEP Person educated: Patient Education method: Programmer, multimedia, Facilities manager, and Handouts Education comprehension: verbalized understanding and returned demonstration  HOME EXERCISE PROGRAM: Access Code: [email or web address] URL: [email or web address] Date: 12/16/2023 Prepared by: Trygve Gage  Exercises - Standing Gaze Stabilization with Head Rotation  - 2 x daily - 7 x weekly - 1 sets - 2 reps - 30 seconds hold - Single Leg Stance with Support  - 1 x daily - 5 x weekly - 1 sets - 3 reps - 15 seconds hold - Standing balance with eyes closed on a pillow  - 1 x daily - 5 x weekly - 1 sets - 3 reps - 10-15 seconds hold - Seated Upper Trapezius Stretch  - 1 x daily - 5 x weekly - 1 sets - 3 reps - 30 seconds hold - Seated Shoulder Rolls  - 1 x daily - 5 x weekly - 1 sets - 100 reps - Shoulder  W - External Rotation with Resistance  - 2 x daily - 7 x weekly - 1 sets - 10 reps  GOALS: Goals reviewed with patient? Yes  SHORT TERM GOALS: Target date: 12/06/2023  Pt will be independent with initial HEP Baseline: Goal status: MET  2.  Pt will tolerate watching sports on TV x 10 minutes without increase in symptoms Baseline:1-2 minutes  Goal status: MET-- notes he can do this now  LONG TERM GOALS: Target date: 01/03/2024  Pt will be independent with HEP to self manage symptoms Baseline:  Goal status: MET  2.  Pt will improve DHI to <= 30 to demo improved functional mobility Baseline: 48 Goal status:MET=20%  3.  Pt will tolerate reaching to upper  shelf with cervical extension without increase in symptoms Baseline:  Goal status: MET  4.  Pt will tolerate VOR with cancellation in busy environment without increase in symptoms Baseline:  Goal status:MET   ASSESSMENT: CLINICAL IMPRESSION: The patient has met STGs/ LTGs. He is scheduled to see ENT 7/22. PT provided more neck stretching and stabilization to current HEP and kept gaze and high level balance activities. Also encouraged walking program for chronic dizziness. Plan to d/c with HEP.   EVAL: Patient is a 73 y.o. male who was seen today for physical therapy evaluation and treatment for dizziness. Pt presents with deficits in oculomotor exam as well as increased symptoms with VOR with cancellation. He does report some increased symptoms with cervical extension. He is negative during positional testing this visit but did take medication prior to evaluation which may be masking some symptoms. Pt will benefit from skilled PT to address deficits to reduce episodes of dizziness and reduce fall risk to progress towards more functional safety and independence.   OBJECTIVE IMPAIRMENTS: decreased activity tolerance, decreased balance, and dizziness.   PLAN:  PT FREQUENCY: 1-2x/week  PT DURATION: 8 weeks  PLANNED INTERVENTIONS: 97164- PT Re-evaluation, 97110-Therapeutic exercises, 97530- Therapeutic activity, 97112- Neuromuscular re-education, 97535- Self Care, 59563- Manual therapy, Balance training, Vestibular training, and Visual/preceptual remediation/compensation  PLAN FOR NEXT SESSION:Discharge   Urias Sheek, PT 12/16/2023, 9:23 AM

## 2024-01-17 DIAGNOSIS — H8110 Benign paroxysmal vertigo, unspecified ear: Secondary | ICD-10-CM | POA: Diagnosis not present

## 2024-01-17 DIAGNOSIS — H903 Sensorineural hearing loss, bilateral: Secondary | ICD-10-CM | POA: Diagnosis not present

## 2024-01-31 DIAGNOSIS — M9901 Segmental and somatic dysfunction of cervical region: Secondary | ICD-10-CM | POA: Diagnosis not present

## 2024-01-31 DIAGNOSIS — M9903 Segmental and somatic dysfunction of lumbar region: Secondary | ICD-10-CM | POA: Diagnosis not present

## 2024-01-31 DIAGNOSIS — M5451 Vertebrogenic low back pain: Secondary | ICD-10-CM | POA: Diagnosis not present

## 2024-01-31 DIAGNOSIS — M542 Cervicalgia: Secondary | ICD-10-CM | POA: Diagnosis not present

## 2024-02-26 ENCOUNTER — Other Ambulatory Visit: Payer: Self-pay | Admitting: Family Medicine

## 2024-02-28 ENCOUNTER — Encounter: Payer: Self-pay | Admitting: Sports Medicine

## 2024-03-01 ENCOUNTER — Encounter: Payer: Self-pay | Admitting: Family Medicine

## 2024-03-01 ENCOUNTER — Ambulatory Visit: Admitting: Family Medicine

## 2024-03-01 VITALS — BP 130/77 | HR 69 | Ht 70.0 in | Wt 216.8 lb

## 2024-03-01 DIAGNOSIS — I442 Atrioventricular block, complete: Secondary | ICD-10-CM | POA: Diagnosis not present

## 2024-03-01 DIAGNOSIS — I10 Essential (primary) hypertension: Secondary | ICD-10-CM

## 2024-03-01 DIAGNOSIS — Z23 Encounter for immunization: Secondary | ICD-10-CM | POA: Diagnosis not present

## 2024-03-01 NOTE — Progress Notes (Unsigned)
 Established Patient Office Visit  Subjective  Patient ID: Joshua Warner, male    DOB: 1951/03/11  Age: 73 y.o. MRN: [id number]  Chief Complaint  Patient presents with   Hypertension    HPI  Discussed the use of AI scribe software for clinical note transcription with the patient, who gave verbal consent to proceed.  History of Present Illness Joshua Warner is a 73 year old male who presents for a follow-up visit to discuss his general health and medication management.  Exercise tolerance and weight loss - Increased frequency of gym visits - Improved stamina, especially when climbing stairs and walking from his car - Weight loss of 8 pounds since March (from 224 to 216 pounds) - Adjusted belt to a smaller notch  Gastroesophageal reflux symptoms - Reflux symptoms worsened after starting a new sleep supplement containing ascorbic acid and calcium - Waking up at night gagging due to reflux while on the supplement - Discontinued the supplement, resulting in improvement - Currently taking pantoprazole  for reflux management  Vertigo and meniere's disease symptoms - History of Meniere's disease - Meclizine taken every morning with good symptom control - Occasional additional dose of meclizine if symptoms arise - Maxzide also taken, believed to help with symptoms  Musculoskeletal pain - Occasional back pain after lifting heavy objects - Manages back pain with Voltaren gel - Occasionally visits a chiropractor for back pain  Cardiopulmonary symptoms and blood pressure monitoring - No chest pain or shortness of breath - Monitors blood pressure at home with good readings    {History (Optional):23778}  ROS    Objective:     BP 130/77   Pulse 69   Ht 5' 10 (1.778 m)   Wt 216 lb 12.8 oz (98.3 kg)   SpO2 95%   BMI 31.11 kg/m  {Vitals History (Optional):23777}  Physical Exam Vitals and nursing note reviewed.  Constitutional:      Appearance: Normal appearance.   HENT:     Head: Normocephalic and atraumatic.  Eyes:     Conjunctiva/sclera: Conjunctivae normal.  Cardiovascular:     Rate and Rhythm: Normal rate and regular rhythm.  Pulmonary:     Effort: Pulmonary effort is normal.     Breath sounds: Normal breath sounds.  Skin:    General: Skin is warm and dry.  Neurological:     Mental Status: He is alert.  Psychiatric:        Mood and Affect: Mood normal.      No results found for any visits on 03/01/24.  {Labs (Optional):23779}  The ASCVD Risk score (Arnett DK, et al., 2019) failed to calculate for the following reasons:   Risk score cannot be calculated because patient has a medical history suggesting prior/existing ASCVD    Assessment & Plan:   Problem List Items Addressed This Visit       Cardiovascular and Mediastinum   HYPERTENSION, BENIGN - Primary   Other Visit Diagnoses       Encounter for immunization       Relevant Orders   Flu vaccine HIGH DOSE PF(Fluzone Trivalent) (Completed)      Assessment and Plan Assessment & Plan Hypertension Hypertension well-controlled with Losartan  and Maxzide. Good home blood pressure readings and weight loss noted. - Continue Losartan  25 MG daily. - Continue Maxzide.  Possible Mnire's disease with dizziness Dizziness managed with meclizine and Maxzide. Specialist did not confirm Mnire's disease but noted effective current treatment. Consideration for trial of holding Maxzide to assess symptom impact. -  Continue meclizine daily. - Continue Maxzide. - Consider trial of holding Maxzide to assess impact on symptoms, with replacement for blood pressure management if needed.  Gastroesophageal reflux disease (GERD) Recent supplement trial exacerbated symptoms due to ascorbic acid and calcium. Supplement stopped. - Continue current GERD management. - Ensure pantoprazole  prescription is refilled.  Acute low back pain Acute low back pain likely from lifting. Improved with  topical Voltaren and resolved by next morning. - Use topical Voltaren as needed for pain. - Consider chiropractic care if pain persists.  General Health Maintenance Received flu shot. Discussed COVID-19 vaccination, recommending new 2025-2026 season vaccine. - Administer flu shot. - Recommend COVID-19 vaccine for 2025-2026 season when available.  Follow-Up Blood work status to be checked for any due tests. - Check blood work status to determine if any tests are due.     Return in about 6 months (around 08/29/2024) for Hypertension.    Dorothyann Byars, MD

## 2024-03-02 NOTE — Assessment & Plan Note (Signed)
 Asymptomatic and no recent CP or SOB.

## 2024-03-12 DIAGNOSIS — M542 Cervicalgia: Secondary | ICD-10-CM | POA: Diagnosis not present

## 2024-03-12 DIAGNOSIS — M9903 Segmental and somatic dysfunction of lumbar region: Secondary | ICD-10-CM | POA: Diagnosis not present

## 2024-03-12 DIAGNOSIS — M5451 Vertebrogenic low back pain: Secondary | ICD-10-CM | POA: Diagnosis not present

## 2024-03-12 DIAGNOSIS — M9901 Segmental and somatic dysfunction of cervical region: Secondary | ICD-10-CM | POA: Diagnosis not present

## 2024-03-16 DIAGNOSIS — M9901 Segmental and somatic dysfunction of cervical region: Secondary | ICD-10-CM | POA: Diagnosis not present

## 2024-03-16 DIAGNOSIS — M542 Cervicalgia: Secondary | ICD-10-CM | POA: Diagnosis not present

## 2024-03-16 DIAGNOSIS — M9903 Segmental and somatic dysfunction of lumbar region: Secondary | ICD-10-CM | POA: Diagnosis not present

## 2024-03-16 DIAGNOSIS — M5451 Vertebrogenic low back pain: Secondary | ICD-10-CM | POA: Diagnosis not present

## 2024-04-10 ENCOUNTER — Ambulatory Visit: Payer: Medicare Other

## 2024-04-10 VITALS — Ht 70.0 in | Wt 216.0 lb

## 2024-04-10 DIAGNOSIS — Z0000 Encounter for general adult medical examination without abnormal findings: Secondary | ICD-10-CM

## 2024-04-10 DIAGNOSIS — G8929 Other chronic pain: Secondary | ICD-10-CM | POA: Diagnosis not present

## 2024-04-10 NOTE — Patient Instructions (Signed)
  Mr. Waddington , Thank you for taking time to come for your Medicare Wellness Visit. I appreciate your ongoing commitment to your health goals. Please review the following plan we discussed and let me know if I can assist you in the future.   These are the goals we discussed:  Goals       Patient Stated (pt-stated)      Patient would like to 20 lbs.      Patient Stated (pt-stated)      Patient would like to loose 20 lbs.      Patient Stated (pt-stated)      Patient stated that he would like to loose some more weight.      Patient Stated      Patient states he would like to lose weight.       Weight (lb) < 200 lb (90.7 kg)      Would like to loose at least 20 lbs.        This is a list of the screening recommended for you and due dates:  Health Maintenance  Topic Date Due   COVID-19 Vaccine (3 - Moderna risk series) 01/29/2021   Medicare Annual Wellness Visit  04/10/2025   DTaP/Tdap/Td vaccine (3 - Td or Tdap) 09/12/2028   Colon Cancer Screening  12/09/2029   Pneumococcal Vaccine for age over 23  Completed   Flu Shot  Completed   Hepatitis C Screening  Completed   Zoster (Shingles) Vaccine  Completed   Meningitis B Vaccine  Aged Out

## 2024-04-10 NOTE — Progress Notes (Signed)
 Subjective:   Joshua Warner is a 73 y.o. male who presents for Medicare Annual/Subsequent preventive examination.  Visit Complete: Virtual I connected with  Dominik Yordy on 04/10/24 by a audio enabled telemedicine application and verified that I am speaking with the correct person using two identifiers.  Patient Location: Home  Provider Location: Office/Clinic  I discussed the limitations of evaluation and management by telemedicine. The patient expressed understanding and agreed to proceed.  Vital Signs: Because this visit was a virtual/telehealth visit, some criteria may be missing or patient reported. Any vitals not documented were not able to be obtained and vitals that have been documented are patient reported.  Patient Medicare AWV questionnaire was completed by the patient on n/a; I have confirmed that all information answered by patient is correct and no changes since this date.  Cardiac Risk Factors include: advanced age (>25men, >48 women);male gender;dyslipidemia;hypertension;family history of premature cardiovascular disease;obesity (BMI >30kg/m2)     Objective:    Today's Vitals   04/10/24 0801 04/10/24 0802  Weight: 216 lb (98 kg)   Height: 5' 10 (1.778 m)   PainSc:  1    Body mass index is 30.99 kg/m.     04/10/2024    8:12 AM 04/06/2023    8:09 AM 03/02/2023    3:14 PM 10/12/2022    4:18 PM 04/02/2022    8:05 AM 03/25/2021    9:09 AM 07/31/2019   10:09 AM  Advanced Directives  Does Patient Have a Medical Advance Directive? No No Yes Yes No No No  Would patient like information on creating a medical advance directive? No - Patient declined No - Patient declined   No - Patient declined No - Patient declined No - Patient declined    Current Medications (verified) Outpatient Encounter Medications as of 04/10/2024  Medication Sig   ASPIRIN ADULT PO Take 81 mg by mouth.    atorvastatin (LIPITOR) 80 MG tablet TAKE 1 TABLET BY MOUTH EVERY DAY   b complex  vitamins tablet Take 1 tablet by mouth daily.   Glucosamine-Chondroit-Vit C-Mn (GLUCOSAMINE CHONDR 1500 COMPLX) CAPS Take by mouth daily.   loratadine (CLARITIN) 10 MG tablet Take 10 mg by mouth daily.   meclizine (ANTIVERT) 25 MG tablet Take 25 mg by mouth as needed for dizziness.   metoprolol tartrate (LOPRESSOR) 25 MG tablet Take 12.5 mg by mouth 2 (two) times daily.   Multiple Vitamin (MULTIVITAMIN) tablet Take 2 tablets by mouth daily.   nitroGLYCERIN  (NITROSTAT ) 0.4 MG SL tablet Place 1 tablet (0.4 mg total) under the tongue every 5 (five) minutes as needed.   ondansetron  (ZOFRAN -ODT) 4 MG disintegrating tablet Take 1 tablet (4 mg total) by mouth daily as needed for nausea or vomiting.   pantoprazole  (PROTONIX ) 40 MG tablet TAKE ONE TABLET BY MOUTH DAILY.   triamterene -hydrochlorothiazide  (MAXZIDE-25) 37.5-25 MG tablet Take 1 tablet by mouth daily.   vitamin C (ASCORBIC ACID) 500 MG tablet Take 1,000 mg by mouth daily.   No facility-administered encounter medications on file as of 04/10/2024.    Allergies (verified) Celebrex [celecoxib], Lisinopril , Sulfa antibiotics, Sulfonamide derivatives, and Tamsulosin    History: Past Medical History:  Diagnosis Date   Arthritis    CHF (congestive heart failure) (HCC)    stents   Coronary artery disease    stents   GERD (gastroesophageal reflux disease)    Hyperlipidemia    Hypertension since 2007   Kidney stone 2022   Kidney stones 2009   Myocardial infarction [facility]) 12/2015  Oxygen deficiency    only during heartattack   Past Surgical History:  Procedure Laterality Date   CORONARY ANGIOPLASTY WITH STENT PLACEMENT  12/2015   KNEE ARTHROSCOPY  12/31/2010   Right knee    TONSILLECTOMY  1957   Family History  Problem Relation Age of Onset   Heart disease Father    Hypertension Mother    Arthritis Mother    Prostate cancer Maternal Uncle        Deceased   Depression Maternal Uncle    Cancer Paternal Grandfather    Cancer  Paternal Grandmother    Colon cancer Neg Hx    Stomach cancer Neg Hx    Colon polyps Neg Hx    Esophageal cancer Neg Hx    Social History   Socioeconomic History   Marital status: Married    Spouse name: Peggy   Number of children: 2   Years of education: 15   Highest education level: Some college, no degree  Occupational History   Occupation: Retired    Associate Professor: [employer]  Tobacco Use   Smoking status: Never   Smokeless tobacco: Never  Vaping Use   Vaping status: Never Used  Substance and Sexual Activity   Alcohol use: Not Currently    Alcohol/week: 1.0 standard drink of alcohol    Types: 1 Glasses of wine per week    Comment: Less than one drink per month   Drug use: Never   Sexual activity: Not Currently    Partners: Female    Birth control/protection: None    Comment: route sales for [employer], HS degree, 5 yrs college, married, 2 adult  kids, doesn't exercise regularly, 2-3 caffeinated drinks per day.  Other Topics Concern   Not on file  Social History Narrative   Lives with his wife and their daughter. He enjoys reading, watching t.v. and going to the gym.      Retired    Teacher, early years/pre Strain: Low Risk  (04/10/2024)   Overall Financial Resource Strain (CARDIA)    Difficulty of Paying Living Expenses: Not hard at all  Food Insecurity: No Food Insecurity (04/10/2024)   Hunger Vital Sign    Worried About Running Out of Food in the Last Year: Never true    Ran Out of Food in the Last Year: Never true  Transportation Needs: No Transportation Needs (04/10/2024)   PRAPARE - Administrator, Civil Service (Medical): No    Lack of Transportation (Non-Medical): No  Physical Activity: Sufficiently Active (04/10/2024)   Exercise Vital Sign    Days of Exercise per Week: 5 days    Minutes of Exercise per Session: 30 min  Stress: No Stress Concern Present (04/10/2024)   [employer] of Occupational Health - Occupational  Stress Questionnaire    Feeling of Stress: Not at all  Social Connections: Socially Integrated (04/10/2024)   Social Connection and Isolation Panel    Frequency of Communication with Friends and Family: More than three times a week    Frequency of Social Gatherings with Friends and Family: More than three times a week    Attends Religious Services: More than 4 times per year    Active Member of [employer] or Organizations: Yes    Attends Engineer, structural: More than 4 times per year    Marital Status: Married    Tobacco Counseling Counseling given: Not Answered   Clinical Intake:  Pre-visit preparation completed: Yes  Pain :  0-10 Pain Score: 1  Pain Type: Chronic pain Pain Location: Back Pain Orientation: Lower Pain Descriptors / Indicators: Dull, Aching Pain Onset: More than a month ago Pain Frequency: Intermittent     BMI - recorded: 30.99 Nutritional Status: BMI > 30  Obese Nutritional Risks: None Diabetes: No  How often do you need to have someone help you when you read instructions, pamphlets, or other written materials from your doctor or pharmacy?: 1 - Never What is the last grade level you completed in school?: 15  Interpreter Needed?: No      Activities of Daily Living    04/10/2024    8:03 AM  In your present state of health, do you have any difficulty performing the following activities:  Hearing? 1  Comment Hearing aid  Vision? 0  Difficulty concentrating or making decisions? 1  Walking or climbing stairs? 0  Dressing or bathing? 0  Doing errands, shopping? 0  Preparing Food and eating ? N  Using the Toilet? N  In the past six months, have you accidently leaked urine? Y  Do you have problems with loss of bowel control? N  Managing your Medications? N  Managing your Finances? N  Housekeeping or managing your Housekeeping? N    Patient Care Team: Alvan Dorothyann BIRCH, MD as PCP - General Lawana Lamar NOVAK, MD as Referring Physician  (Cardiology)  Indicate any recent Medical Services you may have received from other than [facility] providers in the past year (date may be approximate).     Assessment:   This is a routine wellness examination for Jahden.  Hearing/Vision screen No results found.   Goals Addressed             This Visit's Progress    Patient Stated       Patient states he would like to lose weight.        Depression Screen    04/10/2024    8:11 AM 03/01/2024    2:20 PM 08/29/2023    2:18 PM 04/06/2023    8:10 AM 10/07/2022   11:54 AM 04/29/2022    3:06 PM 04/02/2022    8:06 AM  PHQ 2/9 Scores  PHQ - 2 Score 0 0 0 0 0 0 0    Fall Risk    04/10/2024    8:12 AM 03/01/2024    2:20 PM 08/29/2023    2:18 PM 04/06/2023    8:06 AM 04/02/2023    9:54 AM  Fall Risk   Falls in the past year? 0 0 1 1 1   Number falls in past yr: 0 0 1 1 1   Injury with Fall? 0 0 1 1 1   Risk for fall due to : No Fall Risks No Fall Risks History of fall(s) History of fall(s)   Follow up Falls evaluation completed Falls evaluation completed Falls evaluation completed Falls evaluation completed;Education provided;Falls prevention discussed     MEDICARE RISK AT HOME: Medicare Risk at Home Any stairs in or around the home?: Yes If so, are there any without handrails?: Yes Home free of loose throw rugs in walkways, pet beds, electrical cords, etc?: Yes Adequate lighting in your home to reduce risk of falls?: Yes Life alert?: No Use of a cane, walker or w/c?: No Grab bars in the bathroom?: Yes Shower chair or bench in shower?: Yes Elevated toilet seat or a handicapped toilet?: No  TIMED UP AND GO:  Was the test performed?  No    Cognitive Function:  04/10/2024    8:14 AM 08/29/2023    2:32 PM 04/06/2023    8:11 AM 04/02/2022    8:09 AM 03/25/2021    9:17 AM  6CIT Screen  What Year? 0 points 0 points 0 points 0 points 0 points  What month? 0 points 0 points 0 points 0 points 0 points  What time? 0 points 0  points 0 points 0 points 0 points  Count back from 20 0 points 0 points 0 points 0 points 0 points  Months in reverse 0 points 0 points 0 points 0 points 0 points  Repeat phrase 2 points 2 points 0 points 0 points 0 points  Total Score 2 points 2 points 0 points 0 points 0 points    Immunizations Immunization History  Administered Date(s) Administered   Fluad Quad(high Dose 65+) 03/13/2019, 03/17/2020, 03/20/2021   INFLUENZA, HIGH DOSE SEASONAL PF 03/04/2017, 03/01/2024   Influenza Split 03/28/2012, 03/28/2012   Influenza Whole 04/28/2009, 03/28/2010   Influenza, Seasonal, Injecte, Preservative Fre 03/22/2014, 03/28/2015, 02/20/2016   Influenza,inj,Quad PF,6+ Mos 03/22/2014, 03/28/2015, 02/20/2016, 03/13/2018   Influenza-Unspecified 04/19/2013, 03/16/2022, 02/14/2023   Moderna SARS-COV2 Booster Vaccination 05/01/2020, 01/01/2021   Moderna Sars-Covid-2 Vaccination 07/30/2019, 08/27/2019   Pneumococcal Conjugate-13 03/04/2017   Pneumococcal Polysaccharide-23 09/11/2018   Td 04/28/2008   Tdap 09/13/2018   Zoster Recombinant(Shingrix) 09/14/2021, 12/09/2021   Zoster, Live 12/14/2013    TDAP status: Up to date  Flu Vaccine status: Up to date  Pneumococcal vaccine status: Up to date  Covid-19 vaccine status: Declined, Education has been provided regarding the importance of this vaccine but patient still declined. Advised may receive this vaccine at local pharmacy or Health Dept.or vaccine clinic. Aware to provide a copy of the vaccination record if obtained from local pharmacy or Health Dept. Verbalized acceptance and understanding.  Qualifies for Shingles Vaccine? Yes   Zostavax completed Yes   Shingrix Completed?: Yes  Screening Tests Health Maintenance  Topic Date Due   COVID-19 Vaccine (3 - Moderna risk series) 01/29/2021   Medicare Annual Wellness (AWV)  04/10/2025   DTaP/Tdap/Td (3 - Td or Tdap) 09/12/2028   Colonoscopy  12/09/2029   Pneumococcal Vaccine: 50+ Years   Completed   Influenza Vaccine  Completed   Hepatitis C Screening  Completed   Zoster Vaccines- Shingrix  Completed   Meningococcal B Vaccine  Aged Out    Health Maintenance  Health Maintenance Due  Topic Date Due   COVID-19 Vaccine (3 - Moderna risk series) 01/29/2021    Colorectal cancer screening: Type of screening: Colonoscopy. Completed 12/10/2019. Repeat every 10 years  Lung Cancer Screening: (Low Dose CT Chest recommended if Age 73-80 years, 20 pack-year currently smoking OR have quit w/in 15years.) does not qualify.   Lung Cancer Screening Referral: N/a  Additional Screening:  Hepatitis C Screening: does qualify; Completed 04/04/2015  Vision Screening: Recommended annual ophthalmology exams for early detection of glaucoma and other disorders of the eye. Is the patient up to date with their annual eye exam?  Yes  Who is the provider or what is the name of the office in which the patient attends annual eye exams? Dr Geroge If pt is not established with a provider, would they like to be referred to a provider to establish care? N/a.   Dental Screening: Recommended annual dental exams for proper oral hygiene   Community Resource Referral / Chronic Care Management: CRR required this visit?  No   CCM required this visit?  No  Plan:     I have personally reviewed and noted the following in the patient's chart:   Medical and social history Use of alcohol, tobacco or illicit drugs  Current medications and supplements including opioid prescriptions. Patient is not currently taking opioid prescriptions. Functional ability and status Nutritional status Physical activity Advanced directives List of other physicians Hospitalizations, surgeries, and ER visits in previous 12 months. None Vitals Screenings to include cognitive, depression, and falls Referrals and appointments  In addition, I have reviewed and discussed with patient certain preventive protocols, quality  metrics, and best practice recommendations. A written personalized care plan for preventive services as well as general preventive health recommendations were provided to patient.     Bonny Jon Mayor, CMA   04/10/2024   After Visit Summary: (MyChart) Due to this being a telephonic visit, the after visit summary with patients personalized plan was offered to patient via MyChart   Nurse Notes:   Jakaiden Fill is a 73 y.o. male patient of Metheney, Dorothyann BIRCH, MD who had a Medicare Annual Wellness Visit today via telephone. Jillian is Retired and lives with their spouse and his daughter. He has 2 children. He reports that he is socially active and does interact with friends/family regularly. He is moderately physically active and enjoys reading, watching television and going to the gym.

## 2024-04-11 DIAGNOSIS — M9903 Segmental and somatic dysfunction of lumbar region: Secondary | ICD-10-CM | POA: Diagnosis not present

## 2024-04-11 DIAGNOSIS — M9901 Segmental and somatic dysfunction of cervical region: Secondary | ICD-10-CM | POA: Diagnosis not present

## 2024-04-11 DIAGNOSIS — M5451 Vertebrogenic low back pain: Secondary | ICD-10-CM | POA: Diagnosis not present

## 2024-04-11 DIAGNOSIS — M62830 Muscle spasm of back: Secondary | ICD-10-CM | POA: Diagnosis not present

## 2024-04-11 DIAGNOSIS — M25511 Pain in right shoulder: Secondary | ICD-10-CM | POA: Diagnosis not present

## 2024-04-11 DIAGNOSIS — M25552 Pain in left hip: Secondary | ICD-10-CM | POA: Diagnosis not present

## 2024-04-11 DIAGNOSIS — M542 Cervicalgia: Secondary | ICD-10-CM | POA: Diagnosis not present

## 2024-04-17 ENCOUNTER — Telehealth: Payer: Self-pay

## 2024-04-17 DIAGNOSIS — R7309 Other abnormal glucose: Secondary | ICD-10-CM

## 2024-04-17 DIAGNOSIS — E785 Hyperlipidemia, unspecified: Secondary | ICD-10-CM

## 2024-04-17 DIAGNOSIS — Z125 Encounter for screening for malignant neoplasm of prostate: Secondary | ICD-10-CM

## 2024-04-17 DIAGNOSIS — I10 Essential (primary) hypertension: Secondary | ICD-10-CM

## 2024-04-17 NOTE — Telephone Encounter (Signed)
 Copied from CRM #[id number]. Topic: Clinical - Request for Lab/Test Order >> Apr 17, 2024  8:21 AM Ivette P wrote: Reason for CRM: pt has upcoming physical and wants to have blood work done before. Lab order  is not in. Please call pt to schedule once lab order is in.

## 2024-04-18 NOTE — Telephone Encounter (Signed)
 Pt advised.

## 2024-04-19 DIAGNOSIS — E785 Hyperlipidemia, unspecified: Secondary | ICD-10-CM | POA: Diagnosis not present

## 2024-04-19 DIAGNOSIS — Z125 Encounter for screening for malignant neoplasm of prostate: Secondary | ICD-10-CM | POA: Diagnosis not present

## 2024-04-19 DIAGNOSIS — R7309 Other abnormal glucose: Secondary | ICD-10-CM | POA: Diagnosis not present

## 2024-04-20 ENCOUNTER — Ambulatory Visit: Payer: Self-pay | Admitting: Family Medicine

## 2024-04-20 LAB — CBC
Hematocrit: 53.4 % — ABNORMAL HIGH (ref 37.5–51.0)
Hemoglobin: 17.9 g/dL — ABNORMAL HIGH (ref 13.0–17.7)
MCH: 31.5 pg (ref 26.6–33.0)
MCHC: 33.5 g/dL (ref 31.5–35.7)
MCV: 94 fL (ref 79–97)
Platelets: 207 x10E3/uL (ref 150–450)
RBC: 5.69 x10E6/uL (ref 4.14–5.80)
RDW: 12.5 % (ref 11.6–15.4)
WBC: 6.5 x10E3/uL (ref 3.4–10.8)

## 2024-04-20 LAB — CMP14+EGFR
ALT: 28 IU/L (ref 0–44)
AST: 34 IU/L (ref 0–40)
Albumin: 4.4 g/dL (ref 3.8–4.8)
Alkaline Phosphatase: 103 IU/L (ref 47–123)
BUN/Creatinine Ratio: 15 (ref 10–24)
BUN: 17 mg/dL (ref 8–27)
Bilirubin Total: 0.8 mg/dL (ref 0.0–1.2)
CO2: 25 mmol/L (ref 20–29)
Calcium: 9.6 mg/dL (ref 8.6–10.2)
Chloride: 97 mmol/L (ref 96–106)
Creatinine, Ser: 1.16 mg/dL (ref 0.76–1.27)
Globulin, Total: 2.4 g/dL (ref 1.5–4.5)
Glucose: 90 mg/dL (ref 70–99)
Potassium: 4.5 mmol/L (ref 3.5–5.2)
Sodium: 139 mmol/L (ref 134–144)
Total Protein: 6.8 g/dL (ref 6.0–8.5)
eGFR: 67 mL/min/1.73 (ref >59)

## 2024-04-20 LAB — HEMOGLOBIN A1C
Est. average glucose Bld gHb Est-mCnc: 114 mg/dL
Hgb A1c MFr Bld: 5.6 % (ref 4.8–5.6)

## 2024-04-20 LAB — PSA: Prostate Specific Ag, Serum: 1.1 ng/mL (ref 0.0–4.0)

## 2024-04-20 LAB — LIPID PANEL WITH LDL/HDL RATIO
Cholesterol, Total: 122 mg/dL (ref 100–199)
HDL: 45 mg/dL (ref >39)
LDL Chol Calc (NIH): 63 mg/dL (ref 0–99)
LDL/HDL Ratio: 1.4 ratio (ref 0.0–3.6)
Triglycerides: 66 mg/dL (ref 0–149)
VLDL Cholesterol Cal: 14 mg/dL (ref 5–40)

## 2024-04-20 NOTE — Progress Notes (Signed)
 Hi Joshua Warner, hemoglobin is still little elevated at 17.9 but that is usually where you run is in that 17 range.  You are welcome to also donate blood that can actually lower your hemoglobin and put you back into the normal range at some point if you would like to do that.  Cholesterol levels look really good your HDL is a little higher this time which is fantastic.  Continue to work on regular exercise.  Prostate test is normal.  A1c looks great at 5.6.  Metabolic panel including liver and kidney looks great.

## 2024-04-26 ENCOUNTER — Ambulatory Visit (INDEPENDENT_AMBULATORY_CARE_PROVIDER_SITE_OTHER): Admitting: Family Medicine

## 2024-04-26 ENCOUNTER — Encounter: Payer: Self-pay | Admitting: Family Medicine

## 2024-04-26 VITALS — BP 128/65 | HR 60 | Ht 70.0 in | Wt 220.5 lb

## 2024-04-26 DIAGNOSIS — Z0000 Encounter for general adult medical examination without abnormal findings: Secondary | ICD-10-CM | POA: Diagnosis not present

## 2024-04-26 NOTE — Progress Notes (Signed)
 Complete physical exam  Patient: Joshua Warner    DOB: Jun 10, 1951 73 y.o.   MRN: [id number]  Chief Complaint  Patient presents with   Annual Exam    Subjective:    Won Kreuzer is a 73 y.o. male who presents today for a complete physical exam. He reports consuming a general diet. The patient does not participate in regular exercise at present. He generally feels fairly well. He reports sleeping fairly well. He does not have additional problems to discuss today.   Discussed the use of AI scribe software for clinical note transcription with the patient, who gave verbal consent to proceed.  History of Present Illness Hendrixx Severin is a 73 year old male who presents with a recent episode of vertigo.  Vertigo and associated symptoms - Experienced an episode of vertigo on Friday afternoon while sitting and reading with the TV on - Onset was gradual - Avoided a spinning sensation by stopping activities and closing eyes - Able to ambulate during the episode but felt wiped out afterward - Vertigo mostly resolved by the next day - No persistent dizziness, but ongoing mild fatigue since the episode - No vertigo episodes for almost six months prior to this event - Similar episode over a year ago at the gym when looking at his phone while on a treadmill  Management of vertigo - Performing vertigo exercises, but rapid movements worsened symptoms, so exercises were discontinued - Taking meclizine every morning and took an additional dose during the episode - Took Zofran  for nausea, which alleviated symptoms  Functional status and activity - Has not returned to the gym since the vertigo episode - Gym will be closed for remodeling soon - Plans to use a stationary bike at home for exercise  General well-being and sleep - Sleeping well at night - Taking naps in the evening - Mood is generally positive  Recent laboratory findings - A1c is 5.6 - Kidney and liver function are  normal - Cholesterol improved: HDL increased from 39 to 45, LDL under 70 - PSA is normal - Hemoglobin is slightly elevated at 17.9, consistent with usual range   Most recent fall risk assessment:    04/26/2024    1:56 PM  Fall Risk   Falls in the past year? 0  Number falls in past yr: 0  Injury with Fall? 0  Risk for fall due to : No Fall Risks  Follow up Falls evaluation completed     Most recent depression screenings:    04/26/2024    1:56 PM 04/10/2024    8:11 AM  PHQ 2/9 Scores  PHQ - 2 Score 0 0  PHQ- 9 Score 2         Patient Care Team: Alvan Dorothyann BIRCH, MD as PCP - General Lawana Lamar NOVAK, MD as Referring Physician (Cardiology)   ROS    Objective:    BP 128/65   Pulse 60   Ht 5' 10 (1.778 m)   Wt 220 lb 8 oz (100 kg)   SpO2 96%   BMI 31.64 kg/m     Physical Exam Constitutional:      Appearance: Normal appearance.  HENT:     Head: Normocephalic and atraumatic.     Right Ear: Tympanic membrane, ear canal and external ear normal.     Left Ear: Tympanic membrane, ear canal and external ear normal.     Nose: Nose normal.     Mouth/Throat:     Pharynx: Oropharynx is  clear.  Eyes:     Extraocular Movements: Extraocular movements intact.     Conjunctiva/sclera: Conjunctivae normal.     Pupils: Pupils are equal, round, and reactive to light.  Neck:     Thyroid : No thyromegaly.  Cardiovascular:     Rate and Rhythm: Normal rate and regular rhythm.  Pulmonary:     Effort: Pulmonary effort is normal.     Breath sounds: Normal breath sounds.  Abdominal:     General: Bowel sounds are normal.     Palpations: Abdomen is soft.     Tenderness: There is no abdominal tenderness.  Musculoskeletal:        General: No swelling.     Cervical back: Neck supple.  Skin:    General: Skin is warm and dry.  Neurological:     Mental Status: He is oriented to person, place, and time.  Psychiatric:        Mood and Affect: Mood normal.        Behavior:  Behavior normal.       No results found for any visits on 04/26/24.       Assessment & Plan:    Routine Health Maintenance and Physical Exam Immunization History  Administered Date(s) Administered   Fluad Quad(high Dose 65+) 03/13/2019, 03/17/2020, 03/20/2021   INFLUENZA, HIGH DOSE SEASONAL PF 03/04/2017, 03/01/2024   Influenza Split 03/28/2012, 03/28/2012   Influenza Whole 04/28/2009, 03/28/2010   Influenza, Seasonal, Injecte, Preservative Fre 03/22/2014, 03/28/2015, 02/20/2016   Influenza,inj,Quad PF,6+ Mos 03/22/2014, 03/28/2015, 02/20/2016, 03/13/2018   Influenza-Unspecified 04/19/2013, 03/16/2022, 02/14/2023   Moderna SARS-COV2 Booster Vaccination 05/01/2020, 01/01/2021   Moderna Sars-Covid-2 Vaccination 07/30/2019, 08/27/2019   Pneumococcal Conjugate-13 03/04/2017   Pneumococcal Polysaccharide-23 09/11/2018   Td 04/28/2008   Tdap 09/13/2018   Zoster Recombinant(Shingrix) 09/14/2021, 12/09/2021   Zoster, Live 12/14/2013    Health Maintenance  Topic Date Due   COVID-19 Vaccine (3 - Moderna risk series) 01/29/2021   Medicare Annual Wellness (AWV)  04/10/2025   DTaP/Tdap/Td (3 - Td or Tdap) 09/12/2028   Colonoscopy  12/09/2029   Pneumococcal Vaccine: 50+ Years  Completed   Influenza Vaccine  Completed   Hepatitis C Screening  Completed   Zoster Vaccines- Shingrix  Completed   Meningococcal B Vaccine  Aged Out    Discussed health benefits of physical activity, and encouraged him to engage in regular exercise appropriate for his age and condition.  Problem List Items Addressed This Visit   None Visit Diagnoses       Wellness examination    -  Primary      Keep up a regular exercise program and make sure you are eating a healthy diet Try to eat 4 servings of dairy a day, or if you are lactose intolerant take a calcium with vitamin D daily.  Your vaccines are up to date.   Assessment and Plan   Assessment & Plan Vertigo Intermittent vertigo episodes  possibly triggered by visual stimuli. Meclizine and Zofran  used for symptom management. - Continue meclizine daily. - Perform vestibular exercises slowly, increasing speed gradually. - Use Zofran  as needed for nausea. - Avoid rapid changes in visual focus.  Elevated hemoglobin Chronic elevated hemoglobin at 17.9, consistent with baseline, likely due to benign causes.  Benign skin lesions Benign skin lesions present, no malignancy suspected. - Continue using prescribed cream on lesions as needed, avoid bedtime application.  General Health Maintenance Routine health maintenance up to date, issue with Medicare denial for flu shot under investigation. -  Investigate and resolve Medicare denial for flu shot.    Return in about 6 months (around 10/25/2024) for Hypertension.    Dorothyann Byars, MD [facility] at [facility]

## 2024-05-29 ENCOUNTER — Telehealth: Payer: Self-pay

## 2024-05-29 NOTE — Telephone Encounter (Signed)
 Patient came into office concerning a balance he has DOS 04/26/24 which  was for a physical, Bruna

## 2024-06-04 ENCOUNTER — Other Ambulatory Visit: Payer: Self-pay | Admitting: Family Medicine

## 2024-06-13 NOTE — Telephone Encounter (Signed)
 Mr. Pesqueira received a bill in the amount of $359.00 for the date of service 04/26/2024, billed under CPT code 00602. Upon review, the patient previously completed a physical visit also billed under CPT code 00602 on 08/29/2023. I explained to Mr. Chaloux that insurance typically covers only one physical per calendar year, which is likely the reason the visit was not covered. However, he stated he was informed by a front desk team member that he had not yet completed a physical this year and that his insurance would cover the visit. He was very adamant about this information being provided prior to the appointment.

## 2024-06-24 ENCOUNTER — Other Ambulatory Visit: Payer: Self-pay | Admitting: Family Medicine

## 2024-06-24 DIAGNOSIS — H8110 Benign paroxysmal vertigo, unspecified ear: Secondary | ICD-10-CM

## 2024-06-24 DIAGNOSIS — H9313 Tinnitus, bilateral: Secondary | ICD-10-CM

## 2024-07-05 NOTE — Telephone Encounter (Signed)
 Called [name], to let him know that I was able to get the physical adjusted for him . Moving forward he will contact to office to schedule a physical to prevent the mistake of scheduling himself incorrectly in mychart as a office visit and then requesting a physical in person. That is why we were not able to see that he already had one , it was a visit change done when he arrived during the March appt and the appt status was not updated to reflect the change. This is why the insurance did not cover October because he requested a physical in March once he arrived to his appt.

## 2024-08-30 ENCOUNTER — Ambulatory Visit: Admitting: Family Medicine

## 2024-10-25 ENCOUNTER — Ambulatory Visit: Admitting: Family Medicine

## 2025-04-11 ENCOUNTER — Ambulatory Visit
